# Patient Record
Sex: Male | Born: 1985 | Race: Black or African American | Hispanic: No | Marital: Single | Smoking: Light tobacco smoker
Health system: Southern US, Community
[De-identification: ages and names within clinical notes are randomized; demographics above are authoritative.]

## PROBLEM LIST (undated history)

## (undated) DIAGNOSIS — D571 Sickle-cell disease without crisis: Secondary | ICD-10-CM

## (undated) DIAGNOSIS — M1990 Unspecified osteoarthritis, unspecified site: Secondary | ICD-10-CM

## (undated) DIAGNOSIS — S7290XA Unspecified fracture of unspecified femur, initial encounter for closed fracture: Secondary | ICD-10-CM

## (undated) DIAGNOSIS — S069X9A Unspecified intracranial injury with loss of consciousness of unspecified duration, initial encounter: Secondary | ICD-10-CM

## (undated) DIAGNOSIS — S069XAA Unspecified intracranial injury with loss of consciousness status unknown, initial encounter: Secondary | ICD-10-CM

## (undated) HISTORY — DX: Unspecified fracture of unspecified femur, initial encounter for closed fracture: S72.90XA

## (undated) HISTORY — DX: Sickle-cell disease without crisis: D57.1

## (undated) HISTORY — DX: Unspecified intracranial injury with loss of consciousness of unspecified duration, initial encounter: S06.9X9A

## (undated) HISTORY — DX: Unspecified intracranial injury with loss of consciousness status unknown, initial encounter: S06.9XAA

---
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
## 2007-03-14 ENCOUNTER — Emergency Department (HOSPITAL_COMMUNITY): Admission: EM | Admit: 2007-03-14 | Discharge: 2007-03-14 | Payer: Self-pay | Admitting: Emergency Medicine

## 2008-12-14 ENCOUNTER — Ambulatory Visit: Payer: Self-pay | Admitting: Infectious Disease

## 2008-12-14 ENCOUNTER — Observation Stay (HOSPITAL_COMMUNITY): Admission: EM | Admit: 2008-12-14 | Discharge: 2008-12-19 | Payer: Self-pay | Admitting: Emergency Medicine

## 2008-12-28 ENCOUNTER — Emergency Department (HOSPITAL_COMMUNITY): Admission: EM | Admit: 2008-12-28 | Discharge: 2008-12-29 | Payer: Self-pay | Admitting: Emergency Medicine

## 2009-02-17 ENCOUNTER — Emergency Department (HOSPITAL_COMMUNITY): Admission: EM | Admit: 2009-02-17 | Discharge: 2009-02-18 | Payer: Self-pay | Admitting: Emergency Medicine

## 2010-04-25 ENCOUNTER — Emergency Department (HOSPITAL_COMMUNITY): Admission: EM | Admit: 2010-04-25 | Discharge: 2010-04-25 | Payer: Self-pay | Admitting: Emergency Medicine

## 2011-01-29 LAB — DIFFERENTIAL
Basophils Relative: 0 % (ref 0–1)
Eosinophils Absolute: 0.1 10*3/uL (ref 0.0–0.7)
Monocytes Relative: 5 % (ref 3–12)
Neutro Abs: 8 10*3/uL — ABNORMAL HIGH (ref 1.7–7.7)
Neutrophils Relative %: 79 % — ABNORMAL HIGH (ref 43–77)

## 2011-01-29 LAB — COMPREHENSIVE METABOLIC PANEL
ALT: 49 U/L (ref 0–53)
Alkaline Phosphatase: 73 U/L (ref 39–117)
BUN: 9 mg/dL (ref 6–23)
CO2: 27 mEq/L (ref 19–32)
Calcium: 9.5 mg/dL (ref 8.4–10.5)
GFR calc non Af Amer: >60 mL/min (ref >60)
Glucose, Bld: 103 mg/dL — ABNORMAL HIGH (ref 70–99)
Potassium: 3.6 mEq/L (ref 3.5–5.1)
Sodium: 138 mEq/L (ref 135–145)
Total Protein: 7.7 g/dL (ref 6.0–8.3)

## 2011-01-29 LAB — RAPID URINE DRUG SCREEN, HOSP PERFORMED
Amphetamines: NOT DETECTED
Opiates: NOT DETECTED
Tetrahydrocannabinol: POSITIVE — AB

## 2011-01-29 LAB — CBC
HCT: 45.5 % (ref 39.0–52.0)
Hemoglobin: 15.1 g/dL (ref 13.0–17.0)
MCHC: 33.3 g/dL (ref 30.0–36.0)
RBC: 5.48 MIL/uL (ref 4.22–5.81)
RDW: 14.2 % (ref 11.5–15.5)

## 2011-01-29 LAB — URINALYSIS, ROUTINE W REFLEX MICROSCOPIC
Bilirubin Urine: NEGATIVE
Hgb urine dipstick: NEGATIVE
Ketones, ur: NEGATIVE mg/dL
Nitrite: NEGATIVE
Protein, ur: NEGATIVE mg/dL
Urobilinogen, UA: 0.2 mg/dL (ref 0.0–1.0)

## 2011-01-29 LAB — POCT CARDIAC MARKERS
CKMB, poc: <1 ng/mL — ABNORMAL LOW (ref 1.0–8.0)
Myoglobin, poc: 56.7 ng/mL (ref 12–200)
Troponin i, poc: <0.05 ng/mL (ref 0.00–0.09)

## 2011-01-29 LAB — RETICULOCYTES
RBC.: 5.31 MIL/uL (ref 4.22–5.81)
Retic Count, Absolute: 58.4 10*3/uL (ref 19.0–186.0)
Retic Ct Pct: 1.1 % (ref 0.4–3.1)

## 2011-02-27 LAB — CK
Total CK: 11066 U/L — ABNORMAL HIGH (ref 7–232)
Total CK: 17975 U/L — ABNORMAL HIGH (ref 7–232)
Total CK: 6496 U/L — ABNORMAL HIGH (ref 7–232)

## 2011-02-27 LAB — RAPID URINE DRUG SCREEN, HOSP PERFORMED
Amphetamines: NOT DETECTED
Opiates: POSITIVE — AB
Tetrahydrocannabinol: POSITIVE — AB

## 2011-02-27 LAB — CBC
HCT: 38.4 % — ABNORMAL LOW (ref 39.0–52.0)
HCT: 38.5 % — ABNORMAL LOW (ref 39.0–52.0)
HCT: 42.6 % (ref 39.0–52.0)
Hemoglobin: 13 g/dL (ref 13.0–17.0)
Hemoglobin: 13.1 g/dL (ref 13.0–17.0)
Hemoglobin: 14.3 g/dL (ref 13.0–17.0)
Hemoglobin: 14.3 g/dL (ref 13.0–17.0)
MCHC: 33.1 g/dL (ref 30.0–36.0)
MCHC: 34 g/dL (ref 30.0–36.0)
MCV: 81.4 fL (ref 78.0–100.0)
MCV: 81.9 fL (ref 78.0–100.0)
MCV: 82.8 fL (ref 78.0–100.0)
MCV: 80.9 fL (ref 78.0–100.0)
Platelets: 130 10*3/uL — ABNORMAL LOW (ref 150–400)
Platelets: 138 10*3/uL — ABNORMAL LOW (ref 150–400)
Platelets: 169 10*3/uL (ref 150–400)
Platelets: 189 10*3/uL (ref 150–400)
Platelets: 330 10*3/uL (ref 150–400)
RBC: 4.71 MIL/uL (ref 4.22–5.81)
RBC: 4.85 MIL/uL (ref 4.22–5.81)
RDW: 14.1 % (ref 11.5–15.5)
RDW: 14.6 % (ref 11.5–15.5)
WBC: 7 10*3/uL (ref 4.0–10.5)
WBC: 7.2 10*3/uL (ref 4.0–10.5)
WBC: 7.9 10*3/uL (ref 4.0–10.5)
WBC: 9.4 10*3/uL (ref 4.0–10.5)
WBC: 9.4 10*3/uL (ref 4.0–10.5)
WBC: 9.1 10*3/uL (ref 4.0–10.5)

## 2011-02-27 LAB — COMPREHENSIVE METABOLIC PANEL
ALT: 107 U/L — ABNORMAL HIGH (ref 0–53)
ALT: 22 U/L (ref 0–53)
ALT: 24 U/L (ref 0–53)
ALT: 54 U/L — ABNORMAL HIGH (ref 0–53)
AST: 215 U/L — ABNORMAL HIGH (ref 0–37)
AST: 43 U/L — ABNORMAL HIGH (ref 0–37)
AST: 43 U/L — ABNORMAL HIGH (ref 0–37)
Albumin: 3.2 g/dL — ABNORMAL LOW (ref 3.5–5.2)
Alkaline Phosphatase: 74 U/L (ref 39–117)
Alkaline Phosphatase: 75 U/L (ref 39–117)
Alkaline Phosphatase: 72 U/L (ref 39–117)
BUN: 4 mg/dL — ABNORMAL LOW (ref 6–23)
BUN: 5 mg/dL — ABNORMAL LOW (ref 6–23)
CO2: 24 mEq/L (ref 19–32)
CO2: 25 mEq/L (ref 19–32)
CO2: 26 mEq/L (ref 19–32)
Calcium: 8.5 mg/dL (ref 8.4–10.5)
Calcium: 9.1 mg/dL (ref 8.4–10.5)
Chloride: 104 mEq/L (ref 96–112)
Chloride: 104 mEq/L (ref 96–112)
GFR calc Af Amer: >60 mL/min (ref >60)
GFR calc Af Amer: >60 mL/min (ref >60)
GFR calc Af Amer: >60 mL/min (ref >60)
GFR calc non Af Amer: >60 mL/min (ref >60)
GFR calc non Af Amer: >60 mL/min (ref >60)
Glucose, Bld: 88 mg/dL (ref 70–99)
Potassium: 3.5 mEq/L (ref 3.5–5.1)
Potassium: 3.5 mEq/L (ref 3.5–5.1)
Potassium: 3.6 mEq/L (ref 3.5–5.1)
Potassium: 4 mEq/L (ref 3.5–5.1)
Sodium: 137 mEq/L (ref 135–145)
Sodium: 141 mEq/L (ref 135–145)
Sodium: 141 mEq/L (ref 135–145)
Sodium: 137 mEq/L (ref 135–145)
Total Bilirubin: 0.7 mg/dL (ref 0.3–1.2)
Total Bilirubin: 0.6 mg/dL (ref 0.3–1.2)
Total Protein: 6.8 g/dL (ref 6.0–8.3)
Total Protein: 7.2 g/dL (ref 6.0–8.3)

## 2011-02-27 LAB — TSH: TSH: 1.234 u[IU]/mL (ref 0.350–4.500)

## 2011-02-27 LAB — URINALYSIS, ROUTINE W REFLEX MICROSCOPIC
Bilirubin Urine: NEGATIVE
Glucose, UA: NEGATIVE mg/dL
Hgb urine dipstick: NEGATIVE
Hgb urine dipstick: NEGATIVE
Ketones, ur: NEGATIVE mg/dL
Ketones, ur: NEGATIVE mg/dL
Leukocytes, UA: NEGATIVE
Nitrite: NEGATIVE
Protein, ur: NEGATIVE mg/dL
Protein, ur: NEGATIVE mg/dL
Protein, ur: NEGATIVE mg/dL
Specific Gravity, Urine: 1.006 (ref 1.005–1.030)
Specific Gravity, Urine: 1.013 (ref 1.005–1.030)
Urobilinogen, UA: 0.2 mg/dL (ref 0.0–1.0)
Urobilinogen, UA: 1 mg/dL (ref 0.0–1.0)
Urobilinogen, UA: 1 mg/dL (ref 0.0–1.0)
pH: 7.5 (ref 5.0–8.0)

## 2011-02-27 LAB — DIFFERENTIAL
Basophils Absolute: 0.1 10*3/uL (ref 0.0–0.1)
Basophils Absolute: 0 10*3/uL (ref 0.0–0.1)
Basophils Relative: 0 % (ref 0–1)
Eosinophils Absolute: 0.1 10*3/uL (ref 0.0–0.7)
Eosinophils Absolute: 0.2 10*3/uL (ref 0.0–0.7)
Eosinophils Relative: 1 % (ref 0–5)
Lymphocytes Relative: 20 % (ref 12–46)
Lymphs Abs: 1.4 10*3/uL (ref 0.7–4.0)
Monocytes Absolute: 0.4 10*3/uL (ref 0.1–1.0)
Monocytes Relative: 5 % (ref 3–12)
Monocytes Relative: 4 % (ref 3–12)
Neutro Abs: 4.9 10*3/uL (ref 1.7–7.7)
Neutro Abs: 6.9 10*3/uL (ref 1.7–7.7)
Neutrophils Relative %: 76 % (ref 43–77)

## 2011-02-27 LAB — HIV ANTIBODY (ROUTINE TESTING W REFLEX): HIV: NONREACTIVE

## 2011-02-27 LAB — URINE MICROSCOPIC-ADD ON

## 2011-02-27 LAB — BASIC METABOLIC PANEL
BUN: 4 mg/dL — ABNORMAL LOW (ref 6–23)
BUN: 5 mg/dL — ABNORMAL LOW (ref 6–23)
CO2: 27 mEq/L (ref 19–32)
Chloride: 103 mEq/L (ref 96–112)
Chloride: 99 mEq/L (ref 96–112)
Creatinine, Ser: 0.88 mg/dL (ref 0.4–1.5)
Creatinine, Ser: 0.99 mg/dL (ref 0.4–1.5)
GFR calc Af Amer: >60 mL/min (ref >60)
GFR calc non Af Amer: >60 mL/min (ref >60)
Glucose, Bld: 101 mg/dL — ABNORMAL HIGH (ref 70–99)
Potassium: 3.6 mEq/L (ref 3.5–5.1)
Sodium: 139 mEq/L (ref 135–145)

## 2011-02-27 LAB — URIC ACID: Uric Acid, Serum: 4.9 mg/dL (ref 4.0–7.8)

## 2011-02-27 LAB — SEDIMENTATION RATE: Sed Rate: 3 mm/hr (ref 0–16)

## 2011-02-27 LAB — CK TOTAL AND CKMB (NOT AT ARMC): Total CK: 18481 U/L — ABNORMAL HIGH (ref 7–232)

## 2011-02-27 LAB — PHOSPHORUS: Phosphorus: 3.6 mg/dL (ref 2.3–4.6)

## 2011-02-27 LAB — MAGNESIUM: Magnesium: 2.4 mg/dL (ref 1.5–2.5)

## 2011-03-17 ENCOUNTER — Emergency Department (HOSPITAL_BASED_OUTPATIENT_CLINIC_OR_DEPARTMENT_OTHER)
Admission: EM | Admit: 2011-03-17 | Discharge: 2011-03-18 | Disposition: A | Discharge status: Home / Self Care | Payer: Self-pay | Attending: Emergency Medicine | Admitting: Emergency Medicine

## 2011-03-17 DIAGNOSIS — M549 Dorsalgia, unspecified: Secondary | ICD-10-CM | POA: Insufficient documentation

## 2011-03-17 DIAGNOSIS — D5740 Sickle-cell thalassemia without crisis: Secondary | ICD-10-CM | POA: Insufficient documentation

## 2011-03-27 NOTE — Discharge Summary (Signed)
James Hamilton, James Hamilton           ACCOUNT NO.:  [id number]   MEDICAL RECORD NO.:  [id number]          PATIENT TYPE:  OBV   LOCATION:  3037                         FACILITY:  [facility]   PHYSICIAN:  Acey Lav, MD  DATE OF BIRTH:  05-Apr-1986   DATE OF ADMISSION:  12/14/2008  DATE OF DISCHARGE:  12/19/2008                               DISCHARGE SUMMARY   DISCHARGE DIAGNOSES:  1. Sickle cell pain crisis.  2. Sickle cell/beta thalassemia.  3. Elevated creatinine kinase secondary to sickle cell pain crisis,      resolving.  4. The patient has three different medical record numbers on file in      [facility].   DISCHARGE MEDICATIONS:  Oxycodone 10 mg p.o. q.6 h. p.r.n. for pain.   DISPOSITION AND FOLLOW UP:  The patient was discharged home in stable  condition with much improved pain and was instructed to follow up with  his primary care physician within 1 week of discharge.  The patient was  also instructed to call the [facility] at [phone] to  obtain a hospital followup appointment if he could not be seen by his  PCP, Dr. Orson Hamilton at [facility].  He was also instructed to keep  himself well hydrated at least for the 7 days following discharge.  During his followup appointment, the patient should be asked about the  resolution of his pain crisis.  A creatinine kinase level and a complete  metabolic panel should also be obtained to check for resolution of his  rhabdomyolysis and for normalization of his liver function test.   PROCEDURES PERFORMED:  None.   CONSULTATIONS:  None.   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  The patient presents to the emergency department on day of  admission with complaint of low back pain which he states is exactly the  same as his prior pain  crisis.  States that the pain started  approximately 15 minutes after he lifted weights at his gym for a total  of 2 hours.  The patient denies performing any back exercises or putting  any strenuous strain on his back while lifting.  He has not been to the  gym in over a year and states that his prior pain crisis have usually  occurred after an attempt to exercise.  Denies any headache, chest pain,  shortness of breath, cough, abdominal pain, fevers, chills, nausea,  vomiting, diarrhea, weakness, numbness or tingling, dysuria, or  hematuria.  Denies any associated alleviating or aggravating factors and  has tried using some ibuprofen to help his pain without any relief.  The  patient also denies any trauma to the low back area and states that the  morphine that he received when he arrived to the emergency room was  helpful in relieving his pain.   PHYSICAL EXAMINATION:  VITAL SIGNS:  On admission, temperature 97.1,  blood pressure 123/56,  pulse of 90, respiratory rate of 18, oxygen  saturation 100% on room air.  GENERAL:  He was in no acute distress.  EYES:  Extraocular motions intact.  Pupils equal, round, and reactive to  light.  Anicteric sclerae.  ENT:  Moist mucous membranes and oropharynx was clear without erythema.  NECK:  Supple without JVD or thyromegaly.  RESPIRATORY:  Clear to auscultation bilaterally.  CARDIOVASCULAR:  Regular rate and rhythm.  No murmurs, rubs or gallops.  GI:  Positive bowel sounds with a soft, nontender and nondistended  abdomen.  The patient had no hepatosplenomegaly.  EXTREMITY:  No clubbing, cyanosis or edema.  GENITOURINARY:  No costovertebral angle tenderness.  MUSCULOSKELETAL:  Mild tenderness to palpation in the mid lumbar spine  and paraspinal area.  NEURO:  The patient to be alert and oriented x3.  Cranial nerves II-XII  were intact, 5/5 strength and normal sensation throughout and 2+ deep  tendon reflexes throughout.   LABORATORY DATA:  The  patient had a white blood cell count of 9.4,  hemoglobin of 14.3, platelets of 189, MCV of 83.  Sodium 141, potassium  5, chloride 27, bicarb 25, BUN 7, creatinine 1.8, glucose 95, total  bilirubin 0.9, alk phos 94, AST 23, ALT 22.  The patient's initial  urinalysis was completely within normal limits.   PROBLEM:  1. Sickle cell pain crisis.  On admission, the patient was started on      IV morphine as needed and oral Vicodin for his pain.  The patient      also received a film of his lumbar spine which was unremarkable.      Records from his primary care James Hamilton's office were tried to be      obtained but were not able to do so.  The patient stated that his      pain started to radiate into his thighs on hospital day #2 which      had happened for with his previous pain crisis.  An initial urine      drug screen obtained on admission was negative except for      marijuana.  The patient required an increased dose of morphine      during hospital day #2 and 3 however, afterwards his pain became      less and less until he was completely off IV morphine and switched      over to oral pain medications.  By the day of discharge, the      patient's pain was much better controlled although he still had      some discomfort in his upper and his low back.  He was discharged      home with oxycodone and instructed to follow up with his primary      care James Hamilton within 1 week.  2. Increased creatinine kinase.  On admission, the patient was found      to have an elevated total CK level of approximately 2000.      Initially, there was a concern for rhabdomyolysis as his CK has      elevated to a maximum of over 20,000 on hospital day #2 and his LDH      was also found to be elevated at 817.  This was thought most likely      to have been secondary to sickle cell pain crisis and the patient      was given aggressive IV hydration to prevent any renal function  degradation from his elevated  creatinine kinase.  An ESR was      checked and found to be within normal limits, however, the C-      reactive protein was slightly elevated during this admission.  The      patient's urinalysis did not indicate any signs of myoglobinuria,      however.  The patient maintained good urine output throughout the      rest of his hospitalization and the patient's urine was alkalinized      to a pH of about 6.5-7.0 as well with addition of bicarbonate to      his normal saline IV fluids.  A TSH and HIV antibody were also      checked.  However, these were both negative for any abnormalities.      Throughout the rest of his hospital course, the patient had      decreased creatinine kinase levels on subsequent hospital days and      by day of discharge was found to be approximately 6000.  The      patient was also noticed to have slight elevation in his AST and      ALT most likely due to his muscle breakdown.  Therefore, he was      changed from oral Vicodin for his pain control to oxycodone to      decrease the Tylenol burden.  The patient will need to have his      liver function test re-evaluated as well as to repeat creatinine      kinase levels checked on his followup appointment to ensure      resolution of his rhabdo.   DISCHARGE LABORATORY DATA AND VITALS:  On day of discharge, the patient  had a temperature of 98.3, blood pressure of 112/63, pulse of 69,  respiratory rate of 18, and oxygen saturation of 99% on room air.  His  white blood cell count was 7.8, hemoglobin 12.7, MCV of 82.8, platelets  of 169.  Sodium 132, potassium 3.2, chloride 99,  bicarb 27, BUN 4, creatinine 0.88, glucose 101, calcium 8.5, magnesium  2.0, phosphorus 3.7, creatinine kinase was 6400.  TSH was 1.23.  HIV was  nonreactive.  Serum uric acid was 4.9, ESR was 3.0, CRP was 6.4,  peripheral smear was notable for target cells and urinalysis was  completely within normal limits by the day of  discharge.      Lucy Antigua, MD  Electronically Signed      Acey Lav, MD  Electronically Signed    RK/MEDQ  D:  12/20/2008  T:  12/21/2008  Job:  [id number]   cc:   Dr. Orson Hamilton

## 2011-06-15 ENCOUNTER — Encounter: Payer: Self-pay | Admitting: *Deleted

## 2011-06-15 ENCOUNTER — Other Ambulatory Visit: Payer: Self-pay

## 2011-06-15 ENCOUNTER — Emergency Department (INDEPENDENT_AMBULATORY_CARE_PROVIDER_SITE_OTHER): Payer: Self-pay

## 2011-06-15 ENCOUNTER — Emergency Department (HOSPITAL_BASED_OUTPATIENT_CLINIC_OR_DEPARTMENT_OTHER)
Admission: EM | Admit: 2011-06-15 | Discharge: 2011-06-15 | Disposition: A | Discharge status: Home / Self Care | Payer: Self-pay | Attending: Emergency Medicine | Admitting: Emergency Medicine

## 2011-06-15 DIAGNOSIS — F17200 Nicotine dependence, unspecified, uncomplicated: Secondary | ICD-10-CM | POA: Insufficient documentation

## 2011-06-15 DIAGNOSIS — D571 Sickle-cell disease without crisis: Secondary | ICD-10-CM | POA: Insufficient documentation

## 2011-06-15 DIAGNOSIS — R12 Heartburn: Secondary | ICD-10-CM

## 2011-06-15 DIAGNOSIS — R079 Chest pain, unspecified: Secondary | ICD-10-CM | POA: Insufficient documentation

## 2011-06-15 HISTORY — DX: Sickle-cell disease without crisis: D57.1

## 2011-06-15 MED ORDER — OXYCODONE-ACETAMINOPHEN 5-325 MG PO TABS
1.0000 | ORAL_TABLET | Freq: Once | ORAL | Status: AC
  Administered 2011-06-15: 1 via ORAL

## 2011-06-15 MED ORDER — GI COCKTAIL ~~LOC~~
30.0000 mL | Freq: Once | ORAL | Status: AC
  Administered 2011-06-15: 30 mL via ORAL

## 2011-06-15 MED ORDER — FAMOTIDINE 20 MG PO TABS
20.0000 mg | ORAL_TABLET | Freq: Once | ORAL | Status: AC
  Administered 2011-06-15: 20 mg via ORAL

## 2011-06-15 MED ORDER — FAMOTIDINE 20 MG PO TABS: 20.0000 mg | ORAL_TABLET | Freq: Two times a day (BID) | ORAL | Status: DC

## 2011-06-15 NOTE — ED Provider Notes (Signed)
History     CSN: [id number] Arrival date & time: 06/15/2011  7:03 PM  Chief Complaint  Patient presents with  . Chest Pain   Patient is a 25 y.o. male presenting with chest pain. The history is provided by the patient and a parent.  Chest Pain The chest pain began yesterday. Duration of episode(s) is 1 day. Chest pain occurs constantly. The chest pain is unchanged. Associated with: nothing. The severity of the pain is moderate. The quality of the pain is described as pressure-like. The pain does not radiate. Chest pain is worsened by eating. Pertinent negatives for primary symptoms include no fever, no syncope, no shortness of breath, no cough, no wheezing, no palpitations, no abdominal pain, no nausea, no vomiting and no dizziness.  Pertinent negatives for associated symptoms include no lower extremity edema. He tried NSAIDs for the symptoms.   has acid taste in his mouth, took a few doses of motrin yesterday for a HA that is resolved.   Past Medical History  Diagnosis Date  . Sickle cell anemia     History reviewed. No pertinent past surgical history.  No family history on file.  History  Substance Use Topics  . Smoking status: Current Everyday Smoker -- 0.5 packs/day  . Smokeless tobacco: Not on file  . Alcohol Use: Yes      Review of Systems  Constitutional: Negative for fever and chills.  HENT: Negative for neck pain and neck stiffness.   Eyes: Negative for pain.  Respiratory: Negative for cough, shortness of breath and wheezing.   Cardiovascular: Positive for chest pain. Negative for palpitations and syncope.  Gastrointestinal: Negative for nausea, vomiting and abdominal pain.  Genitourinary: Negative for dysuria.  Musculoskeletal: Negative for back pain.  Skin: Negative for rash.  Neurological: Negative for dizziness and headaches.  All other systems reviewed and are negative.    Physical Exam  BP 133/86  Pulse 57  Temp(Src) 98.7 F (37.1 C) (Oral)  Resp 18   SpO2 99%  Physical Exam  Constitutional: He is oriented to person, place, and time. He appears well-developed and well-nourished.  HENT:  Head: Normocephalic and atraumatic.  Eyes: Conjunctivae and EOM are normal. Pupils are equal, round, and reactive to light.  Neck: Full passive range of motion without pain. Neck supple. No thyromegaly present.       No meningismus  Cardiovascular: Normal rate, regular rhythm, S1 normal, S2 normal and intact distal pulses.   Pulmonary/Chest: Effort normal and breath sounds normal.  Abdominal: Soft. Bowel sounds are normal. There is no tenderness. There is no CVA tenderness.  Musculoskeletal: Normal range of motion.  Neurological: He is alert and oriented to person, place, and time. He has normal strength and normal reflexes. No cranial nerve deficit or sensory deficit. He displays a negative Romberg sign. GCS eye subscore is 4. GCS verbal subscore is 5. GCS motor subscore is 6.       Normal Gait  Skin: Skin is warm and dry. No rash noted. No cyanosis. Nails show no clubbing.  Psychiatric: He has a normal mood and affect. His speech is normal and behavior is normal.    ED Course  Procedures   Date: 06/15/2011  Rate: 63  Rhythm: normal sinus rhythm  QRS Axis: normal  Intervals: normal  ST/T Wave abnormalities: nonspecific ST/T changes  Conduction Disutrbances:nonspecific intraventricular conduction delay  Narrative Interpretation:   Old EKG Reviewed: unchanged    MDM Symptoms suggest reflux, GI cocktail PO resolved symptoms in  ED.  CXR reviewed NAD. No F/C or symptoms of acute chest given h/o SCA.  Screening ECG reviewed and for abnormal T wave inversions similar to old ECg, results were shared with PT.  Presentation not c/w ACS.       Sunnie Nielsen, MD 06/15/11 [phone]

## 2011-06-15 NOTE — ED Notes (Signed)
States he feels like he has reflux x 2 days. Aching in his left chest temp relief with rolaids.

## 2012-02-29 ENCOUNTER — Encounter (HOSPITAL_COMMUNITY): Payer: Self-pay | Admitting: Adult Health

## 2012-02-29 ENCOUNTER — Emergency Department (HOSPITAL_COMMUNITY)
Admission: EM | Admit: 2012-02-29 | Discharge: 2012-02-29 | Discharge status: Against Medical Advice | Payer: Self-pay | Attending: Emergency Medicine | Admitting: Emergency Medicine

## 2012-02-29 ENCOUNTER — Non-Acute Institutional Stay (HOSPITAL_COMMUNITY)
Admission: AD | Admit: 2012-02-29 | Discharge: 2012-02-29 | Disposition: A | Discharge status: Home / Self Care | Payer: Medicaid Other | Source: Ambulatory Visit | Attending: Internal Medicine | Admitting: Internal Medicine

## 2012-02-29 ENCOUNTER — Encounter (HOSPITAL_BASED_OUTPATIENT_CLINIC_OR_DEPARTMENT_OTHER): Payer: Self-pay

## 2012-02-29 ENCOUNTER — Emergency Department (HOSPITAL_BASED_OUTPATIENT_CLINIC_OR_DEPARTMENT_OTHER)
Admission: EM | Admit: 2012-02-29 | Discharge: 2012-02-29 | Disposition: A | Discharge status: Home / Self Care | Payer: Medicaid Other | Attending: Emergency Medicine | Admitting: Emergency Medicine

## 2012-02-29 DIAGNOSIS — D5700 Hb-SS disease with crisis, unspecified: Secondary | ICD-10-CM | POA: Insufficient documentation

## 2012-02-29 DIAGNOSIS — D57419 Sickle-cell thalassemia with crisis, unspecified: Secondary | ICD-10-CM | POA: Insufficient documentation

## 2012-02-29 LAB — CBC
MCV: 73.4 fL — ABNORMAL LOW (ref 78.0–100.0)
Platelets: 236 10*3/uL (ref 150–400)
RBC: 5.23 MIL/uL (ref 4.22–5.81)
RDW: 13.8 % (ref 11.5–15.5)
WBC: 12.1 10*3/uL — ABNORMAL HIGH (ref 4.0–10.5)

## 2012-02-29 LAB — COMPREHENSIVE METABOLIC PANEL
ALT: 17 U/L (ref 0–53)
AST: 21 U/L (ref 0–37)
Alkaline Phosphatase: 69 U/L (ref 39–117)
BUN: 9 mg/dL (ref 6–23)
BUN: 10 mg/dL (ref 6–23)
CO2: 23 mEq/L (ref 19–32)
CO2: 22 mEq/L (ref 19–32)
Calcium: 9.4 mg/dL (ref 8.4–10.5)
Chloride: 105 mEq/L (ref 96–112)
Creatinine, Ser: 1 mg/dL (ref 0.50–1.35)
GFR calc Af Amer: >90 mL/min (ref >90)
GFR calc Af Amer: >90 mL/min (ref >90)
GFR calc non Af Amer: >90 mL/min (ref >90)
GFR calc non Af Amer: >90 mL/min (ref >90)
Glucose, Bld: 77 mg/dL (ref 70–99)
Glucose, Bld: 80 mg/dL (ref 70–99)
Potassium: 4.9 mEq/L (ref 3.5–5.1)
Sodium: 139 mEq/L (ref 135–145)
Total Bilirubin: 0.8 mg/dL (ref 0.3–1.2)

## 2012-02-29 LAB — DIFFERENTIAL
Blasts: 0 %
Eosinophils Absolute: 0.1 10*3/uL (ref 0.0–0.7)
Eosinophils Relative: 1 % (ref 0–5)
Metamyelocytes Relative: 0 %
Monocytes Absolute: 0.6 10*3/uL (ref 0.1–1.0)
Monocytes Relative: 5 % (ref 3–12)
Neutro Abs: 9.3 10*3/uL — ABNORMAL HIGH (ref 1.7–7.7)
Neutrophils Relative %: 77 % (ref 43–77)
nRBC: 0 /100 WBC

## 2012-02-29 LAB — URINALYSIS, ROUTINE W REFLEX MICROSCOPIC
Bilirubin Urine: NEGATIVE
Ketones, ur: NEGATIVE mg/dL
Leukocytes, UA: NEGATIVE
Nitrite: NEGATIVE
Urobilinogen, UA: 1 mg/dL (ref 0.0–1.0)

## 2012-02-29 MED ORDER — DEXTROSE-NACL 5-0.45 % IV SOLN
INTRAVENOUS | Status: DC
  Administered 2012-02-29: 16:00:00 via INTRAVENOUS

## 2012-02-29 MED ORDER — ONDANSETRON HCL 4 MG/2ML IJ SOLN: 4.0000 mg | INTRAMUSCULAR | Status: DC | PRN

## 2012-02-29 MED ORDER — HYDROMORPHONE HCL PF 1 MG/ML IJ SOLN
1.0000 mg | INTRAMUSCULAR | Status: AC
  Administered 2012-02-29: 1 mg via INTRAVENOUS
  Filled 2012-02-29: qty 1

## 2012-02-29 MED ORDER — ONDANSETRON HCL 4 MG PO TABS: 4.0000 mg | ORAL_TABLET | ORAL | Status: DC | PRN

## 2012-02-29 MED ORDER — ONDANSETRON HCL 4 MG/2ML IJ SOLN
4.0000 mg | Freq: Once | INTRAMUSCULAR | Status: AC
  Administered 2012-02-29: 4 mg via INTRAVENOUS
  Filled 2012-02-29: qty 2

## 2012-02-29 MED ORDER — SODIUM CHLORIDE 0.9 % IV SOLN: Freq: Once | INTRAVENOUS | Status: DC

## 2012-02-29 MED ORDER — HYDROMORPHONE HCL PF 2 MG/ML IJ SOLN
2.0000 mg | Freq: Once | INTRAMUSCULAR | Status: AC
  Administered 2012-02-29: 2 mg via INTRAVENOUS
  Filled 2012-02-29: qty 1

## 2012-02-29 MED ORDER — DIPHENHYDRAMINE HCL 50 MG/ML IJ SOLN: 12.5000 mg | INTRAMUSCULAR | Status: DC | PRN

## 2012-02-29 MED ORDER — FOLIC ACID 1 MG PO TABS: 1.0000 mg | ORAL_TABLET | Freq: Every day | ORAL | Status: AC

## 2012-02-29 MED ORDER — OXYCODONE-ACETAMINOPHEN 10-650 MG PO TABS: 1.0000 | ORAL_TABLET | Freq: Four times a day (QID) | ORAL | Status: AC | PRN

## 2012-02-29 MED ORDER — FOLIC ACID 1 MG PO TABS: 1.0000 mg | ORAL_TABLET | Freq: Every day | ORAL | Status: DC

## 2012-02-29 MED ORDER — SODIUM CHLORIDE 0.9 % IV BOLUS (SEPSIS)
1000.0000 mL | Freq: Once | INTRAVENOUS | Status: AC
  Administered 2012-02-29: 1000 mL via INTRAVENOUS

## 2012-02-29 MED ORDER — DIPHENHYDRAMINE HCL 25 MG PO CAPS: 25.0000 mg | ORAL_CAPSULE | ORAL | Status: DC | PRN

## 2012-02-29 MED ORDER — HYDROMORPHONE HCL PF 2 MG/ML IJ SOLN
2.0000 mg | INTRAMUSCULAR | Status: DC | PRN
  Administered 2012-02-29: 2 mg via INTRAVENOUS
  Administered 2012-02-29: 4 mg via INTRAVENOUS
  Filled 2012-02-29: qty 1
  Filled 2012-02-29: qty 2

## 2012-02-29 NOTE — Discharge Instructions (Signed)
Sickle Cell Pain Crisis Sickle cell anemia requires regular medical attention by your healthcare provider and awareness about when to seek medical care. Pain is a common problem in children with sickle cell disease. This usually starts at less than 25 year of age. Pain can occur nearly anywhere in the body but most commonly occurs in the extremities, back, chest, or belly (abdomen). Pain episodes can start suddenly or may follow an illness. These attacks can appear as decreased activity, loss of appetite, change in behavior, or simply complaints of pain. DIAGNOSIS   Specialized blood and gene testing can help make this diagnosis early in the disease. Blood tests may then be done to watch blood levels.   Specialized brain scans are done when there are problems in the brain during a crisis.   Lung testing may be done later in the disease.  HOME CARE INSTRUCTIONS   Maintain good hydration. Increase you or your child's fluid intake in hot weather and during exercise.   Avoid smoking. Smoking lowers the oxygen in the blood and can cause the production of sickle-shaped cells (sickling).   Control pain. Only take over-the-counter or prescription medicines for pain, discomfort, or fever as directed by your caregiver. Do not give aspirin to children because of the association with Reye's syndrome.   Keep regular health care checks to keep a proper red blood cell (hemoglobin) level. A moderate anemia level protects against sickling crises.   You and your child should receive all the same immunizations and care as the people around you.   Mothers should breastfeed their babies if possible. Use formulas with iron added if breastfeeding is not possible. Additional iron should not be given unless there is a lack of it. People with sickle cell disease (SCD) build up iron faster than normal. Give folic acid and additional vitamins as directed.   If you or your child has been prescribed antibiotics or other  medications to prevent problems, take them as directed.   Summer camps are available for children with SCD. They may help young people deal with their disease. The camps introduce them to other children with the same problem.   Young people with SCD may become frustrated or angry at their disease. This can cause rebellion and refusal to follow medical care. Help groups or counseling may help with these problems.   Wear a medical alert bracelet. When traveling, keep medical information, caregiver's names, and the medications you or your child takes with you at all times.  SEEK IMMEDIATE MEDICAL CARE IF:   You or your child develops dizziness or fainting, numbness in or difficulty with movement of arms and legs, difficulty with speech, or is acting abnormally. This could be early signs of a stroke. Immediate treatment is necessary.   You or your child has an oral temperature above 102 F (38.9 C), not controlled by medicine.   You or your child has other signs of infection (chills, lethargy, irritability, poor eating, vomiting). The younger the child, the more you should be concerned.   With fevers, do not give medicine to lower the fever right away. This could cover up a problem that is developing. Notify your caregiver.   You or your child develops pain that is not helped with medicine.   You or your child develops shortness of breath or is coughing up pus-like or bloody sputum.   You or your child develops any problems that are new and are causing you to worry.   You or   your child develops a persistent, often uncomfortable and painful penile erection. This is called priapism. Always check young boys for this. It is often embarrassing for them and they may not bring it to your attention. This is a medical emergency and needs immediate treatment. If this is not treated it will lead to impotence.   You or your child develops a new onset of abdominal pain, especially on the left side near the  stomach area.   You or your child has any questions or has problems that are not getting better. Return immediately if you feel your child is getting worse, even if your child was seen only a short while ago.  Document Released: 08/08/2005 Document Revised: 10/18/2011 Document Reviewed: 12/28/2009 [employer] Patient Information 2012 [employer]. 

## 2012-02-29 NOTE — Discharge Summary (Signed)
[facility]  Patient ID: James Hamilton MRN: [id number] DOB/AGE: 1986/08/03 25 y.o.  Admit date: 02/29/2012 Discharge date: 02/29/2012  Admission Diagnoses: Sickle cell crisis  Discharge Diagnoses:  Sickle cell crisis Hemoglobin S./beta thalassemia   Discharged Condition: good  Clinic Course: Patient was admitted to the sickle cell the hospital for further treatment of his sickle cell crisis. He was continued on IV fluids of half-normal saline at 125 cc an hour. He was given IV Dilaudid at 2-4 mg IV every 2 hours when necessary. Over the subsequent hours his pain decreased from a 10 to a 6/10. He still has some lower back pain. Minimal leg pain as well. He feels however that he can manage his pain at home with his regular medications of Percocet.  Consults: None  Significant Diagnostic Studies: Laboratory data was reviewed.  Treatments: As noted above. Discharge Exam: Blood pressure 121/63, pulse 71, temperature 97.5 F (36.4 C), temperature source Oral, resp. rate 20, SpO2 98.00%. Head: Normocephalic, without obvious abnormality, atraumatic NECK: No enlarged thyroid. No posterior cervical nodes. LUNGS: Clear to auscultation. No vocal fremitus. CV: Normal S1, S2 without S3. ABDOMEN: No masses or tenderness. MSK: Tenderness in the lower lumbar sacral spine. Negative Homans. NEURO: Intact.  Disposition: Discharged home.  Discharge Orders    Future Orders Please Complete By Expires   Diet - low sodium heart healthy      Increase activity slowly      No wound care      Call MD for:  severe uncontrolled pain      Discharge instructions      Comments:   Follow up with primary care physician in one week.     Medication List  As of 02/29/2012  5:51 PM   TAKE these medications         famotidine 20 MG tablet   Commonly known as: PEPCID   Take 1 tablet (20 mg total) by mouth 2 (two) times daily.      folic acid 1 MG tablet   Commonly known  as: FOLVITE   Take 1 tablet (1 mg total) by mouth daily.      ibuprofen 200 MG tablet   Commonly known as: ADVIL,MOTRIN   Take 400 mg by mouth 3 (three) times daily as needed. For pain      oxyCODONE-acetaminophen 10-650 MG per tablet   Commonly known as: PERCOCET   Take 1 tablet by mouth every 6 (six) hours as needed for pain.             SignedAugust Saucer, Dianelly Ferran 02/29/2012, 5:51 PM

## 2012-02-29 NOTE — Discharge Instructions (Signed)
Go directly to the [facility] common sickle cell unit which is located at [address] [facility]) on the third floor. Their telephone number is [phone].  Sickle Cell Anemia Sickle cell anemia needs regular medical care by your caregiver and awareness by you when to seek medical care. Pain is a common problem in children with sickle cell disease. This usually starts at less than 25 year of age. Pain can occur nearly anywhere in the body, but most commonly happens in the extremities, back, chest, or belly (abdomen). Pain episodes can start suddenly or may follow an illness. These attacks can appear as decreased activity, loss of appetite, change in behavior, or simply complaints of pain. DIAGNOSIS   Specialized blood and gene testing can help make this diagnosis early in the disease. Blood tests may then be done to watch blood levels.   Specialized brain scans are done when there are problems in the brain during a crisis.   Lung testing may be done later in the disease.  HOME CARE INSTRUCTIONS   Maintain good hydration. Increase your child's fluid intake in hot weather and during exercise.   Avoid smoking around your child. Smoking lowers the oxygen in the blood and can cause sickling.   Control pain. Only give your child over-the-counter or prescription medicines for pain, discomfort, or fever as directed by their caregiver. Do not give aspirin to children because of the association with Reye's syndrome.   Keep regular health care checks to keep a proper red blood cell (hemoglobin) level. A moderate anemia level protects against sickling crises.   You or your child should receive all the same immunizations and care as the people around them.   Moms should breastfeed their babies if possible. Use formulas with iron added if breastfeeding is not possible. Additional iron should not be given unless there is a lack of it. People with SCD build up iron faster than normal. Give folic acid  and additional vitamins as directed.   If you or your child has been prescribed antibiotics or other medications to prevent problems, give them as directed.   Summer camps are available for children with SCD. They may help young people deal with their disease. The camps introduce them to other children with the same condition.   Young people with SCD may become frustrated or angry at their disease. This can cause rebellion and refusal to follow medical care. Help groups or counseling may help with these problems.   Make sure your child wears a medical alert bracelet. When traveling, keep your medical information, caregiver's names, and the medications your child takes with you at all times.  SEEK IMMEDIATE MEDICAL CARE IF:   You or your child feels dizzy or faint.   You or your child develops a new onset of abdominal pain, especially on the left side near the stomach area.   You or your child develops a persistent, often uncomfortable and painful penile erection. This is called priapism. Always check young boys for this. It is often embarrassing for them and they may not bring it to your attention. This is a medical emergency and needs immediate treatment. If this is not treated it will lead to impotence.   You or your child develops numbness in or has a hard time moving arms and legs.   You or your child has a hard time with speech.   You have a fever.   You or your child develops signs of infection (chills,  lethargy, irritability, poor eating, vomiting). The younger the child, the more you should be concerned.   With fevers, do not give medications to lower the fever right away. This could cover up a problem that is developing. Notify your caregiver.   You or your child develops pain that is not helped with medicine.   You or your child develops shortness of breath, or is coughing up pus-like or bloody sputum.   You or your child develops any problems that are new and are causing you  to worry.  Document Released: 02/06/2006 Document Revised: 10/18/2011 Document Reviewed: 03/29/2010 [facility] Patient Information 2012 [place], [place].

## 2012-02-29 NOTE — ED Notes (Signed)
C/o sickle cell pain in lower back and legs from [facility] sent over with IV,pain  began 2-3 hours ago

## 2012-02-29 NOTE — ED Notes (Signed)
Pt states that he is having sickle cell crisis pain.  Pt appears very uncomfortable in triage, denies other symptoms besides pain in legs and lower back.

## 2012-02-29 NOTE — ED Notes (Signed)
Pt given two hot packs.  

## 2012-02-29 NOTE — ED Notes (Signed)
Jonah Blue, RN and EMT student at [facility] to start IV and give meds-Pt girlfriend at [facility] angry that pt has not had pain med-I advised her pt has been in the ED less than 30 min and  of need to wait for EDP orders prior to meds given-she left the room-spoke to pt after she left room-states he has no c/o r/t to his care

## 2012-02-29 NOTE — ED Notes (Signed)
Pt aware and agreeable to go to [facility]-states girlfriend is en route to drive him

## 2012-02-29 NOTE — ED Notes (Signed)
Report given to Korah at [facility] clinic-pt to be d/c from ED via private vehicle-lab reports light green top needs to be redrawn due to blood hemolysis

## 2012-02-29 NOTE — ED Notes (Signed)
Korah at [facility] notified pt has been d/c and en route with girlfriend

## 2012-02-29 NOTE — ED Provider Notes (Signed)
History     CSN: [id number]  Arrival date & time 02/29/12  1227   First MD Initiated Contact with Patient 02/29/12 1240      Chief Complaint  Patient presents with  . Sickle Cell Pain Crisis    (Consider location/radiation/quality/duration/timing/severity/associated sxs/prior treatment) Patient is a 26 y.o. male presenting with sickle cell pain. The history is provided by the patient.  Sickle Cell Pain Crisis   He had been doing some lifting and had sudden onset of severe pain across his low back into his legs which is typical of his sickle cell pain. He rates pain at 10/10. He has not taken anything to help it. Nothing makes the pain better nothing makes it worse. He denies fever, chills, sweats. He denies chest pain or dyspnea or nausea or vomiting. He states he had not eaten or drank anything today. He has not taken anything for pain. When his pain started today, he came directly to the emergency department.  Past Medical History  Diagnosis Date  . Sickle cell anemia     History reviewed. No pertinent past surgical history.  History reviewed. No pertinent family history.  History  Substance Use Topics  . Smoking status: Current Everyday Smoker -- 0.5 packs/day  . Smokeless tobacco: Not on file  . Alcohol Use: Yes      Review of Systems  All other systems reviewed and are negative.    Allergies  Review of patient's allergies indicates no known allergies.  Home Medications   Current Outpatient Rx  Name Route Sig Dispense Refill  . FAMOTIDINE 20 MG PO TABS Oral Take 1 tablet (20 mg total) by mouth 2 (two) times daily. 30 tablet 0  . IBUPROFEN 200 MG PO TABS Oral Take 400 mg by mouth 3 (three) times daily as needed. For pain       BP 104/82  Pulse 96  Temp(Src) 98.7 F (37.1 C) (Oral)  Resp 20  Ht 6\' 1"  (1.854 m)  Wt 155 lb (70.308 kg)  BMI 20.45 kg/m2  SpO2 97%  Physical Exam  Nursing note and vitals reviewed.  26 year old male who appears to be in  pain. Vital signs are normal. Oxygen saturation is 97% which is normal. Head is normocephalic and atraumatic. PERRLA, EOMI. The face is clear. Neck is nontender and supple without adenopathy. Lungs are clear without rales, wheezes, rhonchi. Back is nontender there's no CVA tenderness. Heart has regular rate and rhythm without murmur. Abdomen is soft, flat, nontender without masses or hepatosplenomegaly. Extremities have full range of motion, no cyanosis or edema. Skin is warm and dry without rash. Neurologic: Mental status is normal, cranial nerves are intact, there are no focal motor or sensory deficits.  ED Course  Procedures (including critical care time)  Results for orders placed during the hospital encounter of 02/29/12  CBC      Component Value Range   WBC 12.1 (*) 4.0 - 10.5 (K/uL)   RBC 5.23  4.22 - 5.81 (MIL/uL)   Hemoglobin 14.2  13.0 - 17.0 (g/dL)   HCT 57.8 (*) 46.9 - 52.0 (%)   MCV 73.4 (*) 78.0 - 100.0 (fL)   MCH 27.2  26.0 - 34.0 (pg)   MCHC 37.0 (*) 30.0 - 36.0 (g/dL)   RDW 62.9  52.8 - 41.3 (%)   Platelets 236  150 - 400 (K/uL)  DIFFERENTIAL      Component Value Range   Neutrophils Relative 77  43 - 77 (%)   Lymphocytes  Relative 17  12 - 46 (%)   Monocytes Relative 5  3 - 12 (%)   Eosinophils Relative 1  0 - 5 (%)   Basophils Relative 0  0 - 1 (%)   Band Neutrophils 0  0 - 10 (%)   Metamyelocytes Relative 0     Myelocytes 0     Promyelocytes Absolute 0     Blasts 0     nRBC 0  0 (/100 WBC)   Neutro Abs 9.3 (*) 1.7 - 7.7 (K/uL)   Lymphs Abs 2.1  0.7 - 4.0 (K/uL)   Monocytes Absolute 0.6  0.1 - 1.0 (K/uL)   Eosinophils Absolute 0.1  0.0 - 0.7 (K/uL)   Basophils Absolute 0.0  0.0 - 0.1 (K/uL)   RBC Morphology POLYCHROMASIA PRESENT    COMPREHENSIVE METABOLIC PANEL      Component Value Range   Sodium 139  135 - 145 (mEq/L)   Potassium 4.9  3.5 - 5.1 (mEq/L)   Chloride 103  96 - 112 (mEq/L)   CO2 23  19 - 32 (mEq/L)   Glucose, Bld 77  70 - 99 (mg/dL)   BUN 9  6  - 23 (mg/dL)   Creatinine, Ser 1.61  0.50 - 1.35 (mg/dL)   Calcium 9.4  8.4 - 09.6 (mg/dL)   Total Protein 7.9  6.0 - 8.3 (g/dL)   Albumin 4.6  3.5 - 5.2 (g/dL)   AST 44 (*) 0 - 37 (U/L)   ALT 17  0 - 53 (U/L)   Alkaline Phosphatase 69  39 - 117 (U/L)   Total Bilirubin 0.8  0.3 - 1.2 (mg/dL)   GFR calc non Af Amer >90  >90 (mL/min)   GFR calc Af Amer >90  >90 (mL/min)  URINALYSIS, ROUTINE W REFLEX MICROSCOPIC      Component Value Range   Color, Urine YELLOW  YELLOW    APPearance CLEAR  CLEAR    Specific Gravity, Urine 1.014  1.005 - 1.030    pH 6.0  5.0 - 8.0    Glucose, UA NEGATIVE  NEGATIVE (mg/dL)   Hgb urine dipstick NEGATIVE  NEGATIVE    Bilirubin Urine NEGATIVE  NEGATIVE    Ketones, ur NEGATIVE  NEGATIVE (mg/dL)   Protein, ur NEGATIVE  NEGATIVE (mg/dL)   Urobilinogen, UA 1.0  0.0 - 1.0 (mg/dL)   Nitrite NEGATIVE  NEGATIVE    Leukocytes, UA NEGATIVE  NEGATIVE   RETICULOCYTES      Component Value Range   Retic Ct Pct 1.6  0.4 - 3.1 (%)   RBC. 5.22  4.22 - 5.81 (MIL/uL)   Retic Count, Manual 83.5  19.0 - 186.0 (K/uL)  COMPREHENSIVE METABOLIC PANEL      Component Value Range   Sodium 139  135 - 145 (mEq/L)   Potassium 3.6  3.5 - 5.1 (mEq/L)   Chloride 105  96 - 112 (mEq/L)   CO2 22  19 - 32 (mEq/L)   Glucose, Bld 80  70 - 99 (mg/dL)   BUN 10  6 - 23 (mg/dL)   Creatinine, Ser 0.45  0.50 - 1.35 (mg/dL)   Calcium 8.8  8.4 - 40.9 (mg/dL)   Total Protein 7.3  6.0 - 8.3 (g/dL)   Albumin 4.4  3.5 - 5.2 (g/dL)   AST 21  0 - 37 (U/L)   ALT 12  0 - 53 (U/L)   Alkaline Phosphatase 71  39 - 117 (U/L)   Total Bilirubin 0.8  0.3 -  1.2 (mg/dL)   GFR calc non Af Amer >90  >90 (mL/min)   GFR calc Af Amer >90  >90 (mL/min)    IV was started and IV fluids are initiated. He was feeling considerably better after an initial dose of hydromorphone and Zofran. Case has been discussed with Dr. Willey Blade who agrees to accept him in the sickle cell unit. Patient will be discharged here to go  directly to the sickle cell unit.  1. Sickle cell crisis       MDM  Sickle cell crisis which may been triggered by a relative dehydration. Of note, he also does smoke black and mild cigars periodically which also puts him at risk for sickle crises. Old records are reviewed and he is noted to have had several ED visits for sickle cell crises. However, he actually has relatively few ED visits. IV fluids and narcotics have been ordered. Will also discuss his case with Dr. August Saucer to see if he would be a good candidate for the sickle cell unit at [facility].        Dione Booze, MD 03/01/12 [phone]

## 2012-03-01 ENCOUNTER — Encounter (HOSPITAL_COMMUNITY): Payer: Self-pay | Admitting: Family Medicine

## 2012-03-01 ENCOUNTER — Emergency Department (HOSPITAL_COMMUNITY)
Admission: EM | Admit: 2012-03-01 | Discharge: 2012-03-02 | Disposition: A | Discharge status: Home / Self Care | Payer: Medicaid Other | Attending: Emergency Medicine | Admitting: Emergency Medicine

## 2012-03-01 DIAGNOSIS — M545 Low back pain, unspecified: Secondary | ICD-10-CM | POA: Insufficient documentation

## 2012-03-01 DIAGNOSIS — D5700 Hb-SS disease with crisis, unspecified: Secondary | ICD-10-CM | POA: Insufficient documentation

## 2012-03-01 DIAGNOSIS — F17200 Nicotine dependence, unspecified, uncomplicated: Secondary | ICD-10-CM | POA: Insufficient documentation

## 2012-03-01 DIAGNOSIS — Z79899 Other long term (current) drug therapy: Secondary | ICD-10-CM | POA: Insufficient documentation

## 2012-03-01 LAB — CBC
MCH: 27.5 pg (ref 26.0–34.0)
MCV: 76.2 fL — ABNORMAL LOW (ref 78.0–100.0)
Platelets: 125 10*3/uL — ABNORMAL LOW (ref 150–400)
RBC: 4.99 MIL/uL (ref 4.22–5.81)
RDW: 14.2 % (ref 11.5–15.5)
WBC: 12.5 10*3/uL — ABNORMAL HIGH (ref 4.0–10.5)

## 2012-03-01 LAB — RETICULOCYTES: Retic Ct Pct: 1.2 % (ref 0.4–3.1)

## 2012-03-01 MED ORDER — DIPHENHYDRAMINE HCL 50 MG/ML IJ SOLN: 25.0000 mg | INTRAMUSCULAR | Status: DC | PRN

## 2012-03-01 MED ORDER — HYDROMORPHONE HCL PF 2 MG/ML IJ SOLN
2.0000 mg | Freq: Once | INTRAMUSCULAR | Status: AC
  Administered 2012-03-01: 2 mg via INTRAVENOUS
  Filled 2012-03-01: qty 1

## 2012-03-01 MED ORDER — SODIUM CHLORIDE 0.9 % IV SOLN
INTRAVENOUS | Status: DC
  Administered 2012-03-01: 20:00:00 via INTRAVENOUS

## 2012-03-01 MED ORDER — HYDROMORPHONE HCL PF 2 MG/ML IJ SOLN
2.0000 mg | INTRAMUSCULAR | Status: AC | PRN
  Administered 2012-03-01 (×2): 2 mg via INTRAVENOUS
  Filled 2012-03-01 (×2): qty 1

## 2012-03-01 MED ORDER — ONDANSETRON HCL 4 MG/2ML IJ SOLN
4.0000 mg | INTRAMUSCULAR | Status: DC | PRN
  Administered 2012-03-01: 4 mg via INTRAVENOUS
  Filled 2012-03-01: qty 2

## 2012-03-01 NOTE — ED Notes (Signed)
Pt c/o back and bilat leg pain, pt states this feels similar to previous crisis that occur about 2x a year. Pt states he was seen at [facility] and sickle cell clinic yesterday and was given Percocet. Pt states he took 1 every 6 hours with no relief. IV est and pt medicated per MD order

## 2012-03-01 NOTE — ED Notes (Signed)
pts mother expressed concern over "swollen area" to back, MD made awarey

## 2012-03-01 NOTE — Discharge Instructions (Signed)
RESOURCE GUIDE  Dental Problems  Patients with Medicaid: [facility]                     [facility] [address]                                           [address] Phone:  [phone]                                                  Phone:  [phone]  If unable to pay or uninsured, contact:  [facility] or [facility] to become qualified for the adult dental clinic.  Chronic Pain Problems Contact [facility]  [phone] Patients need to be referred by their primary care doctor.  Insufficient Money for Medicine Contact [employer]:  call "211" or [facility] 271-5999.  No Primary Care Doctor Call [facility]  [phone] Other agencies that provide inexpensive medical care    [facility]  [phone]    [facility]  [phone]    [facility]  [phone]    Women's Clinic  [phone]    [facility]  [phone]    [facility]  [phone]  Psychological Services [facility]  [phone] [facility]  [phone] [facility]   [phone] (emergency services [phone])  Substance Abuse Resources [facility]  336-882-2125 [facility] 336-784-9470 [facility] 336-285-9073 [facility] 336-845-3988 Residential & Outpatient Substance Abuse Program  800-659-3381  Abuse/Neglect [place] Child Abuse Hotline (336) 641-3795 [place] Child Abuse Hotline 800-378-5315 (After Hours)  Emergency Shelter [facility] (336) 271-5985  M[facility] Homes [facility] (336) 275-9566 [facility] (704) 372-4663  MRSA Hotline #:   832-7006    [place] [facility]  [facility] [place]     [employer]                          [facility] [address] [place]                       [address]      [address]  [place]                                                [place]                            [place] Phone:  [phone]                                   Phone:  [phone]                 Phone:  [phone]  [facility] Phone:  [phone]    [facility] Child Abuse Hotline 478 721 5177 765-663-0307 (After Hours)   Take your usual prescriptions as previously directed.  Apply moist heat or ice to the area(s) of discomfort, for 15 minutes at a time, several times per day for the next few days.  Do not fall asleep on a heating or ice pack.  Call your regular medical doctor or Dr August Saucer on Monday to schedule a follow up appointment this week.  Return to the Emergency Department immediately if worsening.

## 2012-03-01 NOTE — ED Notes (Signed)
Pt reports having sickle cell pain crisis pain in lower back and legs. Reports pain feels like normal crisis.  Denies N/V. Reports generalized weakness.

## 2012-03-01 NOTE — ED Provider Notes (Signed)
History     CSN: [id number]  Arrival date & time 03/01/12  1840   First MD Initiated Contact with Patient 03/01/12 1914      Chief Complaint  Patient presents with  . Sickle Cell Pain Crisis    HPI Pt was seen at 1925.  Per pt, c/o gradual onset and persistence of constant acute flair of his chronic sickle cell "pain" since yesterday.  Pt describes the pain as per is usual chronic pain pattern, located in his lower back and legs.  Pt has been eval in the ED and Sickle Cell clinic for same yesterday but endorses that his "pain meds aren't working at home."  Denies CP/SOB, no fevers, no cough, no abd pain, no N/V/D, no rash.     Past Medical History  Diagnosis Date  . Sickle cell anemia     History reviewed. No pertinent past surgical history.   History  Substance Use Topics  . Smoking status: Current Everyday Smoker -- 0.5 packs/day  . Smokeless tobacco: Not on file  . Alcohol Use: Yes    Review of Systems ROS: Statement: All systems negative except as marked or noted in the HPI; Constitutional: Negative for fever and chills. ; ; Eyes: Negative for eye pain, redness and discharge. ; ; ENMT: Negative for ear pain, hoarseness, nasal congestion, sinus pressure and sore throat. ; ; Cardiovascular: Negative for chest pain, palpitations, diaphoresis, dyspnea and peripheral edema. ; ; Respiratory: Negative for cough, wheezing and stridor. ; ; Gastrointestinal: Negative for nausea, vomiting, diarrhea, abdominal pain, blood in stool, hematemesis, jaundice and rectal bleeding. . ; ; Genitourinary: Negative for dysuria, flank pain and hematuria. ; ; Musculoskeletal: +low back and legs "pain."  Negative for neck pain. Negative for swelling and trauma.; ; Skin: Negative for pruritus, rash, abrasions, blisters, bruising and skin lesion.; ; Neuro: Negative for headache, lightheadedness and neck stiffness. Negative for weakness, altered level of consciousness , altered mental status, extremity  weakness, paresthesias, involuntary movement, seizure and syncope.      Allergies  Review of patient's allergies indicates no known allergies.  Home Medications   Current Outpatient Rx  Name Route Sig Dispense Refill  . ASPIRIN-ACETAMINOPHEN-CAFFEINE 250-250-65 MG PO TABS Oral Take 1 tablet by mouth every 6 (six) hours as needed.    Marland Kitchen FOLIC ACID 1 MG PO TABS Oral Take 1 tablet (1 mg total) by mouth daily. 100 tablet 0  . OXYCODONE-ACETAMINOPHEN 10-650 MG PO TABS Oral Take 1 tablet by mouth every 6 (six) hours as needed for pain. 60 tablet 0    BP 129/70  Pulse 79  Temp(Src) 99.1 F (37.3 C) (Oral)  Resp 20  Ht 6\' 1"  (1.854 m)  Wt 155 lb (70.308 kg)  BMI 20.45 kg/m2  SpO2 98%  Physical Exam 1930: Physical examination:  Nursing notes reviewed; Vital signs and O2 SAT reviewed;  Constitutional: Well developed, Well nourished, Well hydrated, Uncomfortable appearing; Head:  Normocephalic, atraumatic; Eyes: EOMI, PERRL, No scleral icterus; ENMT: Mouth and pharynx normal, Mucous membranes moist; Neck: Supple, Full range of motion, No lymphadenopathy; Cardiovascular: Regular rate and rhythm, No murmur, rub, or gallop; Respiratory: Breath sounds clear & equal bilaterally, No rales, rhonchi, wheezes, or rub, Normal respiratory effort/excursion; Chest: Nontender, Movement normal; Abdomen: Soft, Nontender, Nondistended, Normal bowel sounds; Spine:  No midline CS, TS, LS tenderness. +TTP bilat lumbar paraspinal muscles.;  Extremities: Pulses normal, No tenderness, No edema, No calf edema or asymmetry.; Neuro: AA&Ox3, Major CN grossly intact.  No gross  focal motor or sensory deficits in extremities.; Skin: Color normal, Warm, Dry, no rash.    ED Course  Procedures    MDM  MDM Reviewed: nursing note, vitals and previous chart Reviewed previous: labs Interpretation: labs   Results for orders placed during the hospital encounter of 03/01/12  CBC      Component Value Range   WBC 12.5 (*)  4.0 - 10.5 (K/uL)   RBC 4.99  4.22 - 5.81 (MIL/uL)   Hemoglobin 13.7  13.0 - 17.0 (g/dL)   HCT 82.9 (*) 56.2 - 52.0 (%)   MCV 76.2 (*) 78.0 - 100.0 (fL)   MCH 27.5  26.0 - 34.0 (pg)   MCHC 36.1 (*) 30.0 - 36.0 (g/dL)   RDW 13.0  86.5 - 78.4 (%)   Platelets 125 (*) 150 - 400 (K/uL)  RETICULOCYTES      Component Value Range   Retic Ct Pct 1.2  0.4 - 3.1 (%)   RBC. 4.99  4.22 - 5.81 (MIL/uL)   Retic Count, Manual 59.9  19.0 - 186.0 (K/uL)     11:26 PM:  Pt states he is "ready to go now if you can just give me another shot first."  H/H per baseline.  VS remain stable, resps easy.  Appears comfortable.  Dx testing d/w pt and family.  Questions answered.  Verb understanding, agreeable to d/c home with outpt f/u.             Laray Anger, DO 03/03/12 1219

## 2012-03-02 ENCOUNTER — Telehealth (HOSPITAL_COMMUNITY): Payer: Self-pay | Admitting: Hematology

## 2012-03-02 ENCOUNTER — Encounter (HOSPITAL_COMMUNITY): Payer: Self-pay | Admitting: Hematology

## 2012-03-02 NOTE — Progress Notes (Unsigned)
Pt James Hamilton [facility] at 3[facility], complaining of bilateral leg and lower back pain with a score of 6-7/10 (acceptable score=3/10). Patient denied shortness of breath and chest pain. He allegedly visited the ED last night around 6 am, given IV fluids and controlled pain, then was discharged from ED after acceptable pain control. He was prescribed Percocet which he took 1 tablet PO last at 1:30 pm; obtaining only partial relief. Notified Dr. Sharyn Lull, on-call by his paging service, then he called back after few minutes. He asked where the patient was located, then advised [place] to send patient to the ED for evaluation by the ED doctor, initiation IV fluids and control of pain. He said that ED can call him back if he needs to be admitted. He was advised to report to [facility], but according to nurse Jennings Books that he did not want to go back to [facility] because he experienced "bad care there last time and did not wish to be seen there today". Instead, he was encouraged several times to go to [facility] for hydration and pain control. We offered to take the patient to the ED, but he declined.

## 2012-04-19 ENCOUNTER — Emergency Department (HOSPITAL_COMMUNITY): Payer: PRIVATE HEALTH INSURANCE

## 2012-04-19 ENCOUNTER — Emergency Department (HOSPITAL_COMMUNITY)
Admission: EM | Admit: 2012-04-19 | Discharge: 2012-04-19 | Disposition: A | Discharge status: Home / Self Care | Payer: PRIVATE HEALTH INSURANCE | Attending: Cardiovascular Disease | Admitting: Cardiovascular Disease

## 2012-04-19 ENCOUNTER — Encounter (HOSPITAL_COMMUNITY): Payer: Self-pay

## 2012-04-19 DIAGNOSIS — R079 Chest pain, unspecified: Secondary | ICD-10-CM | POA: Insufficient documentation

## 2012-04-19 DIAGNOSIS — D5700 Hb-SS disease with crisis, unspecified: Secondary | ICD-10-CM

## 2012-04-19 DIAGNOSIS — R0682 Tachypnea, not elsewhere classified: Secondary | ICD-10-CM | POA: Insufficient documentation

## 2012-04-19 DIAGNOSIS — F17200 Nicotine dependence, unspecified, uncomplicated: Secondary | ICD-10-CM | POA: Insufficient documentation

## 2012-04-19 LAB — CBC
HCT: 38.4 % — ABNORMAL LOW (ref 39.0–52.0)
MCHC: 34.9 g/dL (ref 30.0–36.0)
MCV: 76 fL — ABNORMAL LOW (ref 78.0–100.0)
Platelets: 244 10*3/uL (ref 150–400)
RDW: 15.6 % — ABNORMAL HIGH (ref 11.5–15.5)

## 2012-04-19 LAB — DIFFERENTIAL
Basophils Absolute: 0 10*3/uL (ref 0.0–0.1)
Basophils Relative: 0 % (ref 0–1)
Eosinophils Absolute: 0.2 10*3/uL (ref 0.0–0.7)
Eosinophils Relative: 2 % (ref 0–5)
Monocytes Absolute: 0.4 10*3/uL (ref 0.1–1.0)

## 2012-04-19 LAB — COMPREHENSIVE METABOLIC PANEL
AST: 19 U/L (ref 0–37)
Albumin: 3.8 g/dL (ref 3.5–5.2)
Calcium: 9.1 mg/dL (ref 8.4–10.5)
Creatinine, Ser: 1.02 mg/dL (ref 0.50–1.35)
GFR calc non Af Amer: >90 mL/min (ref >90)
Sodium: 140 mEq/L (ref 135–145)
Total Protein: 7.4 g/dL (ref 6.0–8.3)

## 2012-04-19 MED ORDER — ONDANSETRON HCL 4 MG/2ML IJ SOLN
4.0000 mg | Freq: Once | INTRAMUSCULAR | Status: AC
  Administered 2012-04-19: 4 mg via INTRAVENOUS
  Filled 2012-04-19: qty 2

## 2012-04-19 MED ORDER — PANTOPRAZOLE SODIUM 40 MG PO TBEC
40.0000 mg | DELAYED_RELEASE_TABLET | Freq: Once | ORAL | Status: AC
  Administered 2012-04-19: 40 mg via ORAL
  Filled 2012-04-19: qty 1

## 2012-04-19 MED ORDER — HYDROMORPHONE HCL PF 1 MG/ML IJ SOLN: 1.0000 mg | Freq: Once | INTRAMUSCULAR | Status: DC

## 2012-04-19 MED ORDER — HYDROMORPHONE HCL PF 2 MG/ML IJ SOLN
2.0000 mg | Freq: Once | INTRAMUSCULAR | Status: AC
Start: 2012-04-19 — End: 2012-04-19
  Administered 2012-04-19: 2 mg via INTRAVENOUS
  Filled 2012-04-19: qty 1

## 2012-04-19 MED ORDER — GI COCKTAIL ~~LOC~~
30.0000 mL | Freq: Once | ORAL | Status: AC
  Administered 2012-04-19: 30 mL via ORAL
  Filled 2012-04-19: qty 30

## 2012-04-19 MED ORDER — OXYCODONE-ACETAMINOPHEN 5-325 MG PO TABS: 1.0000 | ORAL_TABLET | ORAL | Status: DC | PRN

## 2012-04-19 MED ORDER — SODIUM CHLORIDE 0.9 % IV BOLUS (SEPSIS)
1000.0000 mL | Freq: Once | INTRAVENOUS | Status: AC
  Administered 2012-04-19: 1000 mL via INTRAVENOUS

## 2012-04-19 MED ORDER — HYDROMORPHONE HCL PF 2 MG/ML IJ SOLN
2.0000 mg | Freq: Once | INTRAMUSCULAR | Status: AC
  Administered 2012-04-19: 2 mg via INTRAVENOUS
  Filled 2012-04-19: qty 1

## 2012-04-19 MED ORDER — SODIUM CHLORIDE 0.9 % IV SOLN
Freq: Once | INTRAVENOUS | Status: AC
  Administered 2012-04-19: 10:00:00 via INTRAVENOUS

## 2012-04-19 NOTE — Discharge Instructions (Signed)
Sickle Cell Pain Crisis Sickle cell anemia requires regular medical attention by your healthcare provider and awareness about when to seek medical care. Pain is a common problem in children with sickle cell disease. This usually starts at less than 25 year of age. Pain can occur nearly anywhere in the body but most commonly occurs in the extremities, back, chest, or belly (abdomen). Pain episodes can start suddenly or may follow an illness. These attacks can appear as decreased activity, loss of appetite, change in behavior, or simply complaints of pain. DIAGNOSIS   Specialized blood and gene testing can help make this diagnosis early in the disease. Blood tests may then be done to watch blood levels.   Specialized brain scans are done when there are problems in the brain during a crisis.   Lung testing may be done later in the disease.  HOME CARE INSTRUCTIONS   Maintain good hydration. Increase you or your child's fluid intake in hot weather and during exercise.   Avoid smoking. Smoking lowers the oxygen in the blood and can cause the production of sickle-shaped cells (sickling).   Control pain. Only take over-the-counter or prescription medicines for pain, discomfort, or fever as directed by your caregiver. Do not give aspirin to children because of the association with Reye's syndrome.   Keep regular health care checks to keep a proper red blood cell (hemoglobin) level. A moderate anemia level protects against sickling crises.   You and your child should receive all the same immunizations and care as the people around you.   Mothers should breastfeed their babies if possible. Use formulas with iron added if breastfeeding is not possible. Additional iron should not be given unless there is a lack of it. People with sickle cell disease (SCD) build up iron faster than normal. Give folic acid and additional vitamins as directed.   If you or your child has been prescribed antibiotics or other  medications to prevent problems, take them as directed.   Summer camps are available for children with SCD. They may help young people deal with their disease. The camps introduce them to other children with the same problem.   Young people with SCD may become frustrated or angry at their disease. This can cause rebellion and refusal to follow medical care. Help groups or counseling may help with these problems.   Wear a medical alert bracelet. When traveling, keep medical information, caregiver's names, and the medications you or your child takes with you at all times.  SEEK IMMEDIATE MEDICAL CARE IF:   You or your child develops dizziness or fainting, numbness in or difficulty with movement of arms and legs, difficulty with speech, or is acting abnormally. This could be early signs of a stroke. Immediate treatment is necessary.   You or your child has an oral temperature above 102 F (38.9 C), not controlled by medicine.   You or your child has other signs of infection (chills, lethargy, irritability, poor eating, vomiting). The younger the child, the more you should be concerned.   With fevers, do not give medicine to lower the fever right away. This could cover up a problem that is developing. Notify your caregiver.   You or your child develops pain that is not helped with medicine.   You or your child develops shortness of breath or is coughing up pus-like or bloody sputum.   You or your child develops any problems that are new and are causing you to worry.   You or   your child develops a persistent, often uncomfortable and painful penile erection. This is called priapism. Always check young boys for this. It is often embarrassing for them and they may not bring it to your attention. This is a medical emergency and needs immediate treatment. If this is not treated it will lead to impotence.   You or your child develops a new onset of abdominal pain, especially on the left side near the  stomach area.   You or your child has any questions or has problems that are not getting better. Return immediately if you feel your child is getting worse, even if your child was seen only a short while ago.  Document Released: 08/08/2005 Document Revised: 10/18/2011 Document Reviewed: 12/28/2009 [facility] Patient Information 2012 [place], [place].  Acetaminophen; Oxycodone tablets What is this medicine? ACETAMINOPHEN; OXYCODONE (a set a MEE noe fen; ox i KOE done) is a pain reliever. It is used to treat mild to moderate pain. This medicine may be used for other purposes; ask your health care provider or pharmacist if you have questions. What should I tell my health care provider before I take this medicine? They need to know if you have any of these conditions: -brain tumor -Crohn's disease, inflammatory bowel disease, or ulcerative colitis -drink more than 3 alcohol containing drinks per day -drug abuse or addiction -head injury -heart or circulation problems -kidney disease or problems going to the bathroom -liver disease -lung disease, asthma, or breathing problems -an unusual or allergic reaction to acetaminophen, oxycodone, other opioid analgesics, other medicines, foods, dyes, or preservatives -pregnant or trying to get pregnant -breast-feeding How should I use this medicine? Take this medicine by mouth with a full glass of water. Follow the directions on the prescription label. Take your medicine at regular intervals. Do not take your medicine more often than directed. Talk to your pediatrician regarding the use of this medicine in children. Special care may be needed. Patients over 28 years old may have a stronger reaction and need a smaller dose. Overdosage: If you think you have taken too much of this medicine contact a poison control center or emergency room at once. NOTE: This medicine is only for you. Do not share this medicine with others. What if I miss a dose? If you  miss a dose, take it as soon as you can. If it is almost time for your next dose, take only that dose. Do not take double or extra doses. What may interact with this medicine? -alcohol or medicines that contain alcohol -antihistamines -barbiturates like amobarbital, butalbital, butabarbital, methohexital, pentobarbital, phenobarbital, thiopental, and secobarbital -benztropine -drugs for bladder problems like solifenacin, trospium, oxybutynin, tolterodine, hyoscyamine, and methscopolamine -drugs for breathing problems like ipratropium and tiotropium -drugs for certain stomach or intestine problems like propantheline, homatropine methylbromide, glycopyrrolate, atropine, belladonna, and dicyclomine -general anesthetics like etomidate, ketamine, nitrous oxide, propofol, desflurane, enflurane, halothane, isoflurane, and sevoflurane -medicines for depression, anxiety, or psychotic disturbances -medicines for pain like codeine, morphine, pentazocine, buprenorphine, butorphanol, nalbuphine, tramadol, and propoxyphene -medicines for sleep -muscle relaxants -naltrexone -phenothiazines like perphenazine, thioridazine, chlorpromazine, mesoridazine, fluphenazine, prochlorperazine, promazine, and trifluoperazine -scopolamine -trihexyphenidyl This list may not describe all possible interactions. Give your health care provider a list of all the medicines, herbs, non-prescription drugs, or dietary supplements you use. Also tell them if you smoke, drink alcohol, or use illegal drugs. Some items may interact with your medicine. What should I watch for while using this medicine? Tell your doctor or health care professional if  your pain does not go away, if it gets worse, or if you have new or a different type of pain. You may develop tolerance to the medicine. Tolerance means that you will need a higher dose of the medication for pain relief. Tolerance is normal and is expected if you take this medicine for a long  time. Do not suddenly stop taking your medicine because you may develop a severe reaction. Your body becomes used to the medicine. This does NOT mean you are addicted. Addiction is a behavior related to getting and using a drug for a nonmedical reason. If you have pain, you have a medical reason to take pain medicine. Your doctor will tell you how much medicine to take. If your doctor wants you to stop the medicine, the dose will be slowly lowered over time to avoid any side effects. You may get drowsy or dizzy. Do not drive, use machinery, or do anything that needs mental alertness until you know how this medicine affects you. Do not stand or sit up quickly, especially if you are an older patient. This reduces the risk of dizzy or fainting spells. Alcohol may interfere with the effect of this medicine. Avoid alcoholic drinks. The medicine will cause constipation. Try to have a bowel movement at least every 2 to 3 days. If you do not have a bowel movement for 3 days, call your doctor or health care professional. Do not take Tylenol (acetaminophen) or medicines that have acetaminophen with this medicine. Too much acetaminophen can be very dangerous. Many nonprescription medicines contain acetaminophen. Always read the labels carefully to avoid taking more acetaminophen. What side effects may I notice from receiving this medicine? Side effects that you should report to your doctor or health care professional as soon as possible: -allergic reactions like skin rash, itching or hives, swelling of the face, lips, or tongue -breathing difficulties, wheezing -confusion -light headedness or fainting spells -severe stomach pain -yellowing of the skin or the whites of the eyes Side effects that usually do not require medical attention (report to your doctor or health care professional if they continue or are bothersome): -dizziness -drowsiness -nausea -vomiting This list may not describe all possible side  effects. Call your doctor for medical advice about side effects. You may report side effects to FDA at 1-800-FDA-1088. Where should I keep my medicine? Keep out of the reach of children. This medicine can be abused. Keep your medicine in a safe place to protect it from theft. Do not share this medicine with anyone. Selling or giving away this medicine is dangerous and against the law. Store at room temperature between 20 and 25 degrees C (68 and 77 degrees F). Keep container tightly closed. Protect from light. Flush any unused medicines down the toilet. Do not use the medicine after the expiration date. NOTE: This sheet is a summary. It may not cover all possible information. If you have questions about this medicine, talk to your doctor, pharmacist, or health care provider.  2012, [employer]. (09/27/2008 10:01:21 AM)

## 2012-04-19 NOTE — ED Notes (Signed)
Pt is aware of the need of the urine but he is unable to void at the time.

## 2012-04-19 NOTE — ED Notes (Signed)
Pt in from home with c/o sickle cell crises states pain in chest back stomach, states onset this am denies pain intervention at home

## 2012-04-19 NOTE — ED Provider Notes (Addendum)
History     CSN: [id number]  Arrival date & time 04/19/12  4098   First MD Initiated Contact with Patient 04/19/12 [phone]      Chief Complaint  Patient presents with  . Sickle Cell Pain Crisis    (Consider location/radiation/quality/duration/timing/severity/associated sxs/prior treatment) HPI 26 year old male history of sickle cell disease had onset this morning of severe, sharp pain in his chest and back and upper abdomen. Pain is typical of his sickle cell disease and also a with a component is typical of his acid reflux. Nothing makes it better nothing makes it worse. He denies dyspnea. He has had a cough productive of some clear to yellowish sputum. Denies fever, chills, sweats. He denies nausea, vomiting, diarrhea. He had Percocet at home but has run out. Past Medical History  Diagnosis Date  . Sickle cell anemia     History reviewed. No pertinent past surgical history.  No family history on file.  History  Substance Use Topics  . Smoking status: Current Everyday Smoker -- 0.5 packs/day  . Smokeless tobacco: Not on file  . Alcohol Use: Yes      Review of Systems  Allergies  Review of patient's allergies indicates no known allergies.  Home Medications   Current Outpatient Rx  Name Route Sig Dispense Refill  . ASPIRIN-ACETAMINOPHEN-CAFFEINE 250-250-65 MG PO TABS Oral Take 1 tablet by mouth every 6 (six) hours as needed. PAIN    . FOLIC ACID 1 MG PO TABS Oral Take 1 tablet (1 mg total) by mouth daily. 100 tablet 0    BP 114/56  Pulse 98  Temp(Src) 97.7 F (36.5 C) (Oral)  Resp 22  Ht 6\' 1"  (1.854 m)  Wt 160 lb (72.576 kg)  BMI 21.11 kg/m2  SpO2 100%  Physical Exam  26 year old male who was riding in pain. Vital signs are signif the abdomen is soft without hepatosplenomegaly. Extremities have no cyanosis or edema, full range of motion is present. Skin is warm and dry without rash. Neurologic: Mental status is normal, nerves are intact, there are no focal motor  or sensory deficits.icant for mild tachypnea with respiratory rate of 22. Oxygen saturation is 100% which is normal. Head is normocephalic and atraumatic. PERRLA, EOMI. Oropharynx is clear. Neck is nontender and supple. Back is nontender. Lungs are clear without rales, wheezes, rhonchi. Heart has regular rate rhythm without murmur. There is mild anterior chest wall tenderness. Abdomen has moderate epigastric tenderness without hepatosplenomegaly. The abdomen is soft. Extremities have no cyanosis or edema, full range of motion is present. Skin is warm and dry without rash. Neurologic: Mental status is normal, cranial nerves are intact, there are no motor or sensory deficits.  ED Course  Procedures (including critical care time)  Results for orders placed during the hospital encounter of 04/19/12  CBC      Component Value Range   WBC 9.4  4.0 - 10.5 (K/uL)   RBC 5.05  4.22 - 5.81 (MIL/uL)   Hemoglobin 13.4  13.0 - 17.0 (g/dL)   HCT 47.8 (*) 29.5 - 52.0 (%)   MCV 76.0 (*) 78.0 - 100.0 (fL)   MCH 26.5  26.0 - 34.0 (pg)   MCHC 34.9  30.0 - 36.0 (g/dL)   RDW 62.1 (*) 30.8 - 15.5 (%)   Platelets 244  150 - 400 (K/uL)  DIFFERENTIAL      Component Value Range   Neutrophils Relative 70  43 - 77 (%)   Neutro Abs 6.6  1.7 - 7.7 (  K/uL)   Lymphocytes Relative 23  12 - 46 (%)   Lymphs Abs 2.2  0.7 - 4.0 (K/uL)   Monocytes Relative 4  3 - 12 (%)   Monocytes Absolute 0.4  0.1 - 1.0 (K/uL)   Eosinophils Relative 2  0 - 5 (%)   Eosinophils Absolute 0.2  0.0 - 0.7 (K/uL)   Basophils Relative 0  0 - 1 (%)   Basophils Absolute 0.0  0.0 - 0.1 (K/uL)  COMPREHENSIVE METABOLIC PANEL      Component Value Range   Sodium 140  135 - 145 (mEq/L)   Potassium 3.5  3.5 - 5.1 (mEq/L)   Chloride 104  96 - 112 (mEq/L)   CO2 23  19 - 32 (mEq/L)   Glucose, Bld 84  70 - 99 (mg/dL)   BUN 9  6 - 23 (mg/dL)   Creatinine, Ser 4.09  0.50 - 1.35 (mg/dL)   Calcium 9.1  8.4 - 81.1 (mg/dL)   Total Protein 7.4  6.0 - 8.3 (g/dL)    Albumin 3.8  3.5 - 5.2 (g/dL)   AST 19  0 - 37 (U/L)   ALT 11  0 - 53 (U/L)   Alkaline Phosphatase 103  39 - 117 (U/L)   Total Bilirubin 0.4  0.3 - 1.2 (mg/dL)   GFR calc non Af Amer >90  >90 (mL/min)   GFR calc Af Amer >90  >90 (mL/min)  RETICULOCYTES      Component Value Range   Retic Ct Pct 2.8  0.4 - 3.1 (%)   RBC. 5.05  4.22 - 5.81 (MIL/uL)   Retic Count, Manual 141.4  19.0 - 186.0 (K/uL)   Dg Chest 2 View  04/19/2012  *RADIOLOGY REPORT*  Clinical Data: Sickle cell crisis.  Mid chest pain.  CHEST - 2 VIEW  Comparison: Two-view chest 06/15/2011.  Findings: The heart size is normal.  The lungs are clear.  The visualized soft tissues and bony thorax are unremarkable.  IMPRESSION: Negative chest.  Original Report Authenticated By: Jamesetta Orleans. MATTERN, M.D.      Date: 04/19/2012  Rate: 81  Rhythm: normal sinus rhythm  QRS Axis: normal  Intervals: normal  ST/T Wave abnormalities: normal  Conduction Disutrbances:none  Narrative Interpretation: Left ventricular hypertrophy. When compared with ECG of 06/15/2011, anterolateral and inferior T wave inversions have resolved.  Old EKG Reviewed: changes noted    1. Sickle cell crisis       MDM  Sickle cell crisis. He may have a component of GERD as well. Old records are reviewed and he has multiple ED visits and stays in the sickle cell unit for sickle cell crises.  He has received moderate relief from IV fluids, oxygen, and IV hydromorphone. Case is discussed with Dr. Algie Coffer who agrees to admit him to the sickle cell unit.  Dione Booze, MD 04/19/12 [phone]  Pain has improved, he wishes to go home. He will go home with a prescription for Percocet.  Dione Booze, MD 04/19/12 1055

## 2012-04-20 ENCOUNTER — Emergency Department (HOSPITAL_COMMUNITY): Payer: Medicaid Other

## 2012-04-20 ENCOUNTER — Encounter (HOSPITAL_COMMUNITY): Payer: Self-pay | Admitting: Emergency Medicine

## 2012-04-20 ENCOUNTER — Inpatient Hospital Stay (HOSPITAL_COMMUNITY)
Admission: EM | Admit: 2012-04-20 | Discharge: 2012-04-23 | DRG: 812 | Disposition: A | Discharge status: Home / Self Care | Payer: Medicaid Other | Attending: Internal Medicine | Admitting: Internal Medicine

## 2012-04-20 DIAGNOSIS — Z79899 Other long term (current) drug therapy: Secondary | ICD-10-CM

## 2012-04-20 DIAGNOSIS — K219 Gastro-esophageal reflux disease without esophagitis: Secondary | ICD-10-CM | POA: Diagnosis present

## 2012-04-20 DIAGNOSIS — E876 Hypokalemia: Secondary | ICD-10-CM | POA: Diagnosis present

## 2012-04-20 DIAGNOSIS — F17200 Nicotine dependence, unspecified, uncomplicated: Secondary | ICD-10-CM | POA: Diagnosis present

## 2012-04-20 DIAGNOSIS — Z7982 Long term (current) use of aspirin: Secondary | ICD-10-CM

## 2012-04-20 DIAGNOSIS — J40 Bronchitis, not specified as acute or chronic: Secondary | ICD-10-CM | POA: Diagnosis present

## 2012-04-20 DIAGNOSIS — R079 Chest pain, unspecified: Secondary | ICD-10-CM

## 2012-04-20 DIAGNOSIS — R42 Dizziness and giddiness: Secondary | ICD-10-CM | POA: Diagnosis not present

## 2012-04-20 DIAGNOSIS — R748 Abnormal levels of other serum enzymes: Secondary | ICD-10-CM | POA: Diagnosis present

## 2012-04-20 DIAGNOSIS — Z720 Tobacco use: Secondary | ICD-10-CM | POA: Diagnosis present

## 2012-04-20 DIAGNOSIS — R7989 Other specified abnormal findings of blood chemistry: Secondary | ICD-10-CM

## 2012-04-20 DIAGNOSIS — D5700 Hb-SS disease with crisis, unspecified: Principal | ICD-10-CM | POA: Diagnosis present

## 2012-04-20 DIAGNOSIS — R0789 Other chest pain: Secondary | ICD-10-CM | POA: Diagnosis present

## 2012-04-20 DIAGNOSIS — R112 Nausea with vomiting, unspecified: Secondary | ICD-10-CM | POA: Diagnosis present

## 2012-04-20 DIAGNOSIS — E860 Dehydration: Secondary | ICD-10-CM | POA: Diagnosis present

## 2012-04-20 DIAGNOSIS — D5720 Sickle-cell/Hb-C disease without crisis: Secondary | ICD-10-CM

## 2012-04-20 DIAGNOSIS — R1013 Epigastric pain: Secondary | ICD-10-CM | POA: Diagnosis present

## 2012-04-20 LAB — RETICULOCYTES
Retic Count, Absolute: 87.4 10*3/uL (ref 19.0–186.0)
Retic Ct Pct: 1.9 % (ref 0.4–3.1)

## 2012-04-20 LAB — COMPREHENSIVE METABOLIC PANEL
Albumin: 3.6 g/dL (ref 3.5–5.2)
BUN: 4 mg/dL — ABNORMAL LOW (ref 6–23)
Calcium: 8.9 mg/dL (ref 8.4–10.5)
Creatinine, Ser: 0.93 mg/dL (ref 0.50–1.35)
Total Bilirubin: 0.5 mg/dL (ref 0.3–1.2)
Total Protein: 7 g/dL (ref 6.0–8.3)

## 2012-04-20 LAB — CBC
HCT: 35 % — ABNORMAL LOW (ref 39.0–52.0)
MCH: 26.7 pg (ref 26.0–34.0)
MCHC: 35.1 g/dL (ref 30.0–36.0)
MCV: 76.1 fL — ABNORMAL LOW (ref 78.0–100.0)
RDW: 15.4 % (ref 11.5–15.5)

## 2012-04-20 LAB — POCT I-STAT TROPONIN I: Troponin i, poc: 0 ng/mL (ref 0.00–0.08)

## 2012-04-20 LAB — LIPASE, BLOOD: Lipase: 18 U/L (ref 11–59)

## 2012-04-20 MED ORDER — FOLIC ACID 1 MG PO TABS
1.0000 mg | ORAL_TABLET | Freq: Every day | ORAL | Status: DC
  Administered 2012-04-21 – 2012-04-23 (×2): 1 mg via ORAL
  Filled 2012-04-20 (×3): qty 1

## 2012-04-20 MED ORDER — GI COCKTAIL ~~LOC~~
30.0000 mL | Freq: Two times a day (BID) | ORAL | Status: DC | PRN
  Filled 2012-04-20 (×3): qty 30

## 2012-04-20 MED ORDER — HYDROMORPHONE HCL PF 1 MG/ML IJ SOLN: 1.0000 mg | INTRAMUSCULAR | Status: DC | PRN

## 2012-04-20 MED ORDER — SODIUM CHLORIDE 0.9 % IV SOLN
INTRAVENOUS | Status: DC
  Administered 2012-04-20 (×3): via INTRAVENOUS

## 2012-04-20 MED ORDER — GUAIFENESIN-DM 100-10 MG/5ML PO SYRP: 5.0000 mL | ORAL_SOLUTION | ORAL | Status: DC | PRN

## 2012-04-20 MED ORDER — SODIUM CHLORIDE 0.9 % IV SOLN: INTRAVENOUS | Status: DC

## 2012-04-20 MED ORDER — PANTOPRAZOLE SODIUM 40 MG PO TBEC
40.0000 mg | DELAYED_RELEASE_TABLET | Freq: Two times a day (BID) | ORAL | Status: DC
  Administered 2012-04-20 – 2012-04-21 (×2): 40 mg via ORAL
  Filled 2012-04-20 (×4): qty 1

## 2012-04-20 MED ORDER — HYDROMORPHONE HCL PF 1 MG/ML IJ SOLN
1.0000 mg | INTRAMUSCULAR | Status: DC | PRN
  Administered 2012-04-20 (×2): 1 mg via INTRAVENOUS
  Administered 2012-04-21 (×4): 2 mg via INTRAVENOUS
  Filled 2012-04-20: qty 1
  Filled 2012-04-20 (×2): qty 2
  Filled 2012-04-20: qty 1
  Filled 2012-04-20 (×2): qty 2
  Filled 2012-04-20: qty 1

## 2012-04-20 MED ORDER — HYDROCODONE-ACETAMINOPHEN 5-325 MG PO TABS
1.0000 | ORAL_TABLET | ORAL | Status: DC | PRN
  Administered 2012-04-20 – 2012-04-21 (×3): 2 via ORAL
  Filled 2012-04-20 (×3): qty 2

## 2012-04-20 MED ORDER — ALUM & MAG HYDROXIDE-SIMETH 200-200-20 MG/5ML PO SUSP: 30.0000 mL | Freq: Four times a day (QID) | ORAL | Status: DC | PRN

## 2012-04-20 MED ORDER — ONDANSETRON HCL 4 MG/2ML IJ SOLN: 4.0000 mg | Freq: Four times a day (QID) | INTRAMUSCULAR | Status: DC | PRN

## 2012-04-20 MED ORDER — ACETAMINOPHEN 325 MG PO TABS: 650.0000 mg | ORAL_TABLET | Freq: Four times a day (QID) | ORAL | Status: DC | PRN

## 2012-04-20 MED ORDER — NICOTINE 14 MG/24HR TD PT24
14.0000 mg | MEDICATED_PATCH | Freq: Every day | TRANSDERMAL | Status: DC
  Administered 2012-04-21: 14 mg via TRANSDERMAL
  Filled 2012-04-20 (×2): qty 1

## 2012-04-20 MED ORDER — HYDROMORPHONE HCL PF 1 MG/ML IJ SOLN
1.0000 mg | Freq: Once | INTRAMUSCULAR | Status: AC
  Administered 2012-04-20: 1 mg via INTRAVENOUS
  Filled 2012-04-20: qty 1

## 2012-04-20 MED ORDER — GI COCKTAIL ~~LOC~~
30.0000 mL | Freq: Once | ORAL | Status: AC
  Administered 2012-04-20: 30 mL via ORAL
  Filled 2012-04-20: qty 30

## 2012-04-20 MED ORDER — ONDANSETRON HCL 4 MG PO TABS: 4.0000 mg | ORAL_TABLET | Freq: Four times a day (QID) | ORAL | Status: DC | PRN

## 2012-04-20 MED ORDER — SODIUM CHLORIDE 0.9 % IJ SOLN
3.0000 mL | Freq: Two times a day (BID) | INTRAMUSCULAR | Status: DC
  Administered 2012-04-23: 3 mL via INTRAVENOUS

## 2012-04-20 MED ORDER — ACETAMINOPHEN 650 MG RE SUPP: 650.0000 mg | Freq: Four times a day (QID) | RECTAL | Status: DC | PRN

## 2012-04-20 MED ORDER — POTASSIUM CHLORIDE CRYS ER 20 MEQ PO TBCR
40.0000 meq | EXTENDED_RELEASE_TABLET | Freq: Once | ORAL | Status: AC
  Administered 2012-04-20: 40 meq via ORAL
  Filled 2012-04-20: qty 2

## 2012-04-20 MED ORDER — ALBUTEROL SULFATE (5 MG/ML) 0.5% IN NEBU: 2.5000 mg | INHALATION_SOLUTION | RESPIRATORY_TRACT | Status: DC | PRN

## 2012-04-20 NOTE — ED Notes (Signed)
Report called to [facility]

## 2012-04-20 NOTE — ED Provider Notes (Signed)
History     CSN: [id number]  Arrival date & time 04/20/12  1019   First MD Initiated Contact with Patient 04/20/12 1102      No chief complaint on file.   (Consider location/radiation/quality/duration/timing/severity/associated sxs/prior treatment) The history is provided by the patient and a relative.  26 y/o M with PMH sickle cell disease and GERD presents to ED with c/c of sickle cell crisis pain as well as abdominal pain that he feels is related to reflux. Sickle cell pain began yesterday, in the back and chest. Assoc with SOB yesterday but none today. There has been a cough with purulent sputum, but no fever. No hx acute chest. Typically takes percocet, which has not controlled his pain. Abd pain also began yesterday, generalized but worse to the upper abd, assoc with nausea, yesterday with emesis. No BM since pain began. No prior abd surgeries. Was seen in ED yesterday for both complaints with good improvement of symptoms after both GI cocktail and narcotic pain medications. Sickle cell clinic was apparently closed yesterday and he was sent home from ED after pain improvement.  Past Medical History  Diagnosis Date  . Sickle cell anemia     No past surgical history on file.  No family history on file.  History  Substance Use Topics  . Smoking status: Current Everyday Smoker -- 0.5 packs/day  . Smokeless tobacco: Not on file  . Alcohol Use: Yes      Review of Systems 10 systems reviewed and are negative for acute change except as noted in the HPI.  Allergies  Review of patient's allergies indicates no known allergies.  Home Medications   Current Outpatient Rx  Name Route Sig Dispense Refill  . ASPIRIN-ACETAMINOPHEN-CAFFEINE 250-250-65 MG PO TABS Oral Take 1 tablet by mouth every 6 (six) hours as needed. PAIN    . FOLIC ACID 1 MG PO TABS Oral Take 1 tablet (1 mg total) by mouth daily. 100 tablet 0  . OXYCODONE-ACETAMINOPHEN 5-325 MG PO TABS Oral Take 1 tablet by mouth  every 4 (four) hours as needed for pain. 20 tablet 0    BP 132/73  Pulse 102  Temp(Src) 98.2 F (36.8 C) (Oral)  Resp 20  SpO2 97%  Physical Exam  Constitutional: He appears well-developed and well-nourished. He appears distressed (pacing around room).       Vital signs are reviewed and are normal.   HENT:  Head: Normocephalic and atraumatic.       MMM  Eyes: Pupils are equal, round, and reactive to light.  Neck: Neck supple.  Cardiovascular: Normal rate, regular rhythm and normal heart sounds.        Bilateral radial and DP pulses are 2+   Pulmonary/Chest: Effort normal and breath sounds normal. No respiratory distress. He has no wheezes. He has no rales. He exhibits tenderness.  Abdominal: Soft. Bowel sounds are normal. He exhibits no distension. There is tenderness (diffuse). There is guarding. There is no rebound.  Musculoskeletal: He exhibits no edema and no tenderness.  Neurological: He is alert.  Skin: Skin is warm and dry.    ED Course  Procedures (including critical care time)  Labs Reviewed  CBC - Abnormal; Notable for the following:    Hemoglobin 12.3 (*)    HCT 35.0 (*)    MCV 76.1 (*)    All other components within normal limits  COMPREHENSIVE METABOLIC PANEL - Abnormal; Notable for the following:    Potassium 3.1 (*)    Glucose,  Bld 117 (*)    BUN 4 (*)    All other components within normal limits  LIPASE, BLOOD  RETICULOCYTES   Dg Chest 2 View  04/20/2012  *RADIOLOGY REPORT*  Clinical Data: Back pain, sickle cell crisis  CHEST - 2 VIEW  Comparison: 04/19/12  Findings: Cardiomediastinal silhouette is stable.  No acute infiltrate or pulmonary edema.  Stable H shaped vertebra mid thoracic spine.  IMPRESSION: No active disease.  No significant change.  Original Report Authenticated By: Natasha Mead, M.D.   Dg Chest 2 View  04/19/2012  *RADIOLOGY REPORT*  Clinical Data: Sickle cell crisis.  Mid chest pain.  CHEST - 2 VIEW  Comparison: Two-view chest 06/15/2011.   Findings: The heart size is normal.  The lungs are clear.  The visualized soft tissues and bony thorax are unremarkable.  IMPRESSION: Negative chest.  Original Report Authenticated By: Jamesetta Orleans. MATTERN, M.D.    Date: 04/20/2012  Rate: 71  Rhythm: sinus  QRS Axis: normal  Intervals: normal  ST/T Wave abnormalities: TWI leads III, aVF, V3, V4  Conduction Disutrbances:none  Narrative Interpretation: compared to ECG from yesterday, TWI appears new  Old EKG Reviewed: changes noted   Dx 1: Sickle cell pain crisis Dx 2: Hypokalemia  MDM  Sickle cell anemia, CP/back pain typical for sickle cell crisis, also epigastric pain he feels is related to reflux. No fever or SOB to suggest acute chest, CXR with no change since yesterday to suggest such. Labs with mild anemia, hypokalemia. Otherwise unremarkable. Negative troponin in the setting of fairly constant pain since last night though there appear to be EKG changes on today's tracing as compared to yesterday. Pain not well-controlled after 3 doses IV dilaudid. Hospitalist consulted for admission for sickle cell pain crisis and further eval of EKG changes.  4:47 PM Note- a prior EKG from 06/15/2011 is now evaluated and demonstrates TWI in the same leads as seen today. Admitting MD has been notified.        Shaaron Adler, [place] 04/20/12 [phone]

## 2012-04-20 NOTE — H&P (Signed)
History and Physical       Hospital Admission Note Date: 04/20/2012  Patient name: James Hamilton Medical record number: [id number] Date of birth: 1986-04-24 Age: 26 y.o. Gender: male PCP: No primary provider on file.   Chief Complaint:  Chest pain, back pain since yesterday  HPI: Patient is a 26 year old male with past medical history of sickle cell disease and GERD presented to [facility] ED with complaints of epigastric pain radiating to chest and back pain. Patient states that the back pain is consistent with his sickle cell crisis which started yesterday. He came to the ED yesterday however his pain improved and he was sent home. Patient is accompanied with his mother who also stated that he had difficult time at night due to pain which was not controlled with Percocet. Patient also complains of epigastric pain and chest pain which feels as 'heartburn'. Denies any acute shortness of breath, palpitations, any radiation of the chest pain. He rates the chest and back pain is 6/10 in intensity, constant and sharp. Patient also reports nausea with one episode of vomiting yesterday. He denies any fevers however states that he has some cough with purulent sputum. Chest x-ray in ED did not show any acute pneumonia, patient has no leukocytosis or fevers.   Review of Systems:  Constitutional: Denies fever, chills, diaphoresis, appetite change and fatigue.  HEENT: Denies photophobia, eye pain, redness, hearing loss, ear pain, congestion, sore throat, rhinorrhea, sneezing, mouth sores, trouble swallowing, neck pain, neck stiffness and tinnitus.   Respiratory: See history of present illness    Cardiovascular: See history of present illness   Gastrointestinal: Denies diarrhea, constipation, blood in stool and abdominal distention. + patient endorses having nausea and vomiting with epigastric pain Genitourinary: Denies dysuria, urgency, frequency,  hematuria, flank pain and difficulty urinating.  Musculoskeletal: See history of present illness  Skin: Denies pallor, rash and wound.  Neurological: Denies dizziness, seizures, syncope, weakness, light-headedness, numbness and headaches.  Hematological: Denies adenopathy. Easy bruising, personal or family bleeding history  Psychiatric/Behavioral: Denies suicidal ideation, mood changes, confusion, nervousness, sleep disturbance and agitation  Past Medical History: Past Medical History  Diagnosis Date  . Sickle cell anemia    History reviewed. No pertinent past surgical history.  Medications: Prior to Admission medications   Medication Sig Start Date End Date Taking? Authorizing Provider  aspirin-acetaminophen-caffeine (EXCEDRIN MIGRAINE) [phone] MG per tablet Take 1 tablet by mouth every 6 (six) hours as needed. PAIN   Yes Historical Provider, MD  folic acid (FOLVITE) 1 MG tablet Take 1 tablet (1 mg total) by mouth daily. 02/29/12 02/28/13 Yes Gwenyth Bender, MD  oxyCODONE-acetaminophen (PERCOCET) 5-325 MG per tablet Take 1 tablet by mouth every 4 (four) hours as needed for pain. 04/19/12 04/29/12 Yes Dione Booze, MD    Allergies:  No Known Allergies  Social History:  reports that he has been smoking.  He states that he smokes cigars every day, drinks alcohol occasionally and smokes marijuana   Family History: History reviewed. No pertinent family history.  Physical Exam: Blood pressure 129/85, pulse 75, temperature 99 F (37.2 C), temperature source Oral, resp. rate 22, SpO2 98.00%. General: Alert, awake, oriented x3, in no acute distress. HEENT: anicteric sclera, pink conjunctiva, pupils equal and reactive to light and accomodation Neck: supple, no masses or lymphadenopathy, no goiter, no bruits  Heart: Regular rate and rhythm, without murmurs, rubs or gallops. Lungs: Clear to auscultation bilaterally, no wheezing, rales or rhonchi. Chest wall tenderness noticed with mild  epigastric tenderness Abdomen: Soft, mild epigastric tenderness  nondistended, positive bowel sounds, no masses. Extremities: No clubbing, cyanosis or edema with positive pedal pulses. Neuro: Grossly intact, no focal neurological deficits, strength 5/5 upper and lower extremities bilaterally Psych: alert and oriented x 3, normal mood and affect Skin: no rashes or lesions, warm and dry   LABS on Admission:  Basic Metabolic Panel:  Lab 04/20/12 2130 04/19/12 0811  NA 135 140  K 3.1* 3.5  CL 99 104  CO2 21 23  GLUCOSE 117* 84  BUN 4* 9  CREATININE 0.93 1.02  CALCIUM 8.9 9.1  MG -- --  PHOS -- --   Liver Function Tests:  Lab 04/20/12 1126 04/19/12 0811  AST 28 19  ALT 16 11  ALKPHOS 117 103  BILITOT 0.5 0.4  PROT 7.0 7.4  ALBUMIN 3.6 3.8    Lab 04/20/12 1126  LIPASE 18  AMYLASE --   CBC:  Lab 04/20/12 1126 04/19/12 0811  WBC 9.6 9.4  NEUTROABS -- 6.6  HGB 12.3* 13.4  HCT 35.0* 38.4*  MCV 76.1* --  PLT 163 244     Radiological Exams on Admission: Dg Chest 2 View  04/20/2012  *RADIOLOGY REPORT*  Clinical Data: Back pain, sickle cell crisis  CHEST - 2 VIEW  Comparison: 04/19/12  Findings: Cardiomediastinal silhouette is stable.  No acute infiltrate or pulmonary edema.  Stable H shaped vertebra mid thoracic spine.  IMPRESSION: No active disease.  No significant change.  Original Report Authenticated By: Natasha Mead, M.D.   Dg Chest 2 View  04/19/2012  *RADIOLOGY REPORT*  Clinical Data: Sickle cell crisis.  Mid chest pain.  CHEST - 2 VIEW  Comparison: Two-view chest 06/15/2011.  Findings: The heart size is normal.  The lungs are clear.  The visualized soft tissues and bony thorax are unremarkable.  IMPRESSION: Negative chest.  Original Report Authenticated By: Jamesetta Orleans. MATTERN, M.D.    Assessment/Plan Present on Admission:   .Atypical chest pain: Likely secondary to GERD or acid reflux with acute sickle cell crisis. EKG is reviewed, it appeared that patient had T  wave inversions in lead 3, aVF, V3, V4 but on comparing to the prior EKG in August 2012, does not appear to be new. - Admit to telemetry, obtain 3 sets of cardiac enzymes, if positive, will obtain 2-D echocardiogram and discuss with cardiology  - Place on PPI, IV fluids, pain control   .Sickle cell crisis: Per patient back pain is typical of secured sickle cell crisis  - Will place on IV Dilaudid and PRN percocet, O2 via nasal cannula, IV fluids, folic acid   .Hypokalemia: Replaced in ED   .Tobacco abuse - Placed on Nicoderm patch, counseled strongly for tobacco cessation   .Dehydration/ Nausea & vomiting - Placed on full liquid diet, IV fluids, Zofran   .GERD (gastroesophageal reflux disease): Will place on GI cocktail, PPI twice a day  DVT prophylaxis: SCDs  CODE STATUS: Full code  Further plan will depend as patient's clinical course evolves and further radiologic and laboratory data become available.   @Time  Spent on Admission: 1 hour Angela Vazguez M.D. [facility] 04/20/2012, 5:09 PM

## 2012-04-20 NOTE — ED Notes (Signed)
Report called to floor RN

## 2012-04-20 NOTE — ED Notes (Signed)
Went to collect 16:31 labs - pt requests that we wait until he has pain medication.

## 2012-04-21 ENCOUNTER — Encounter (HOSPITAL_COMMUNITY): Payer: Self-pay | Admitting: *Deleted

## 2012-04-21 ENCOUNTER — Inpatient Hospital Stay (HOSPITAL_COMMUNITY): Payer: Medicaid Other

## 2012-04-21 DIAGNOSIS — J40 Bronchitis, not specified as acute or chronic: Secondary | ICD-10-CM

## 2012-04-21 LAB — CARDIAC PANEL(CRET KIN+CKTOT+MB+TROPI)
Relative Index: 0.8 (ref 0.0–2.5)
Relative Index: 0.9 (ref 0.0–2.5)
Total CK: 162 U/L (ref 7–232)
Total CK: 149 U/L (ref 7–232)
Troponin I: <0.3 ng/mL (ref <0.30)

## 2012-04-21 LAB — BASIC METABOLIC PANEL
CO2: 21 mEq/L (ref 19–32)
Chloride: 101 mEq/L (ref 96–112)
Glucose, Bld: 101 mg/dL — ABNORMAL HIGH (ref 70–99)
Potassium: 3.4 mEq/L — ABNORMAL LOW (ref 3.5–5.1)
Sodium: 135 mEq/L (ref 135–145)

## 2012-04-21 LAB — CBC
Hemoglobin: 11.3 g/dL — ABNORMAL LOW (ref 13.0–17.0)
MCH: 26.3 pg (ref 26.0–34.0)
MCV: 76.3 fL — ABNORMAL LOW (ref 78.0–100.0)
RBC: 4.3 MIL/uL (ref 4.22–5.81)

## 2012-04-21 MED ORDER — SENNOSIDES-DOCUSATE SODIUM 8.6-50 MG PO TABS
1.0000 | ORAL_TABLET | Freq: Every day | ORAL | Status: DC
  Administered 2012-04-21 – 2012-04-22 (×2): 1 via ORAL
  Filled 2012-04-21 (×3): qty 1

## 2012-04-21 MED ORDER — MORPHINE SULFATE 4 MG/ML IJ SOLN
8.0000 mg | INTRAMUSCULAR | Status: DC | PRN
  Administered 2012-04-21 – 2012-04-22 (×7): 8 mg via INTRAVENOUS
  Filled 2012-04-21 (×7): qty 2

## 2012-04-21 MED ORDER — PANTOPRAZOLE SODIUM 40 MG PO TBEC
40.0000 mg | DELAYED_RELEASE_TABLET | Freq: Every day | ORAL | Status: DC
  Administered 2012-04-21 – 2012-04-23 (×3): 40 mg via ORAL
  Filled 2012-04-21 (×2): qty 1

## 2012-04-21 MED ORDER — PNEUMOCOCCAL VAC POLYVALENT 25 MCG/0.5ML IJ INJ
0.5000 mL | INJECTION | INTRAMUSCULAR | Status: AC
  Administered 2012-04-22: 0.5 mL via INTRAMUSCULAR
  Filled 2012-04-21: qty 0.5

## 2012-04-21 MED ORDER — LEVALBUTEROL HCL 0.63 MG/3ML IN NEBU
0.6300 mg | INHALATION_SOLUTION | Freq: Three times a day (TID) | RESPIRATORY_TRACT | Status: DC
  Administered 2012-04-21 (×2): 0.63 mg via RESPIRATORY_TRACT
  Filled 2012-04-21 (×3): qty 3

## 2012-04-21 MED ORDER — OXYCODONE HCL 10 MG PO TB12: 10.0000 mg | ORAL_TABLET | Freq: Two times a day (BID) | ORAL | Status: DC

## 2012-04-21 MED ORDER — KCL IN DEXTROSE-NACL 20-5-0.2 MEQ/L-%-% IV SOLN
INTRAVENOUS | Status: DC
  Administered 2012-04-21 – 2012-04-23 (×3): via INTRAVENOUS
  Filled 2012-04-21 (×3): qty 1000

## 2012-04-21 MED ORDER — ONDANSETRON HCL 4 MG/2ML IJ SOLN
4.0000 mg | INTRAMUSCULAR | Status: DC | PRN
  Filled 2012-04-21: qty 2

## 2012-04-21 MED ORDER — BISMUTH SUBSALICYLATE 262 MG PO CHEW: 524.0000 mg | CHEWABLE_TABLET | Freq: Three times a day (TID) | ORAL | Status: DC

## 2012-04-21 MED ORDER — DM-GUAIFENESIN ER 30-600 MG PO TB12
1.0000 | ORAL_TABLET | Freq: Two times a day (BID) | ORAL | Status: DC
  Administered 2012-04-21 – 2012-04-23 (×5): 1 via ORAL
  Filled 2012-04-21 (×6): qty 1

## 2012-04-21 MED ORDER — KETOROLAC TROMETHAMINE 15 MG/ML IJ SOLN: 15.0000 mg | Freq: Three times a day (TID) | INTRAMUSCULAR | Status: DC | PRN

## 2012-04-21 MED ORDER — HYDROCODONE-ACETAMINOPHEN 5-325 MG PO TABS
1.0000 | ORAL_TABLET | ORAL | Status: DC | PRN
  Administered 2012-04-21: 2 via ORAL
  Filled 2012-04-21 (×2): qty 2

## 2012-04-21 MED ORDER — BISMUTH SUBSALICYLATE 262 MG/15ML PO SUSP
30.0000 mL | Freq: Three times a day (TID) | ORAL | Status: DC
  Administered 2012-04-21 – 2012-04-22 (×4): 30 mL via ORAL
  Filled 2012-04-21: qty 236

## 2012-04-21 MED ORDER — AZITHROMYCIN 500 MG PO TABS
500.0000 mg | ORAL_TABLET | ORAL | Status: DC
  Administered 2012-04-21 – 2012-04-23 (×3): 500 mg via ORAL
  Filled 2012-04-21 (×3): qty 1

## 2012-04-21 MED ORDER — OXYCODONE HCL 10 MG PO TB12
10.0000 mg | ORAL_TABLET | Freq: Two times a day (BID) | ORAL | Status: DC
  Administered 2012-04-22: 10 mg via ORAL
  Filled 2012-04-21: qty 1

## 2012-04-21 MED ORDER — LIDOCAINE 5 % EX PTCH
2.0000 | MEDICATED_PATCH | CUTANEOUS | Status: DC
  Administered 2012-04-21 – 2012-04-23 (×3): 2 via TRANSDERMAL
  Filled 2012-04-21 (×3): qty 2

## 2012-04-21 MED ORDER — KETOROLAC TROMETHAMINE 15 MG/ML IJ SOLN
15.0000 mg | Freq: Three times a day (TID) | INTRAMUSCULAR | Status: DC
  Administered 2012-04-21 – 2012-04-22 (×3): 15 mg via INTRAVENOUS
  Filled 2012-04-21 (×5): qty 1

## 2012-04-21 MED ORDER — DIPHENHYDRAMINE HCL 50 MG/ML IJ SOLN
12.5000 mg | INTRAMUSCULAR | Status: DC | PRN
  Administered 2012-04-22: 25 mg via INTRAVENOUS
  Administered 2012-04-22 – 2012-04-23 (×2): 12.5 mg via INTRAVENOUS
  Filled 2012-04-21 (×3): qty 1

## 2012-04-21 MED ORDER — ONDANSETRON HCL 4 MG PO TABS: 4.0000 mg | ORAL_TABLET | ORAL | Status: DC | PRN

## 2012-04-21 MED ORDER — POTASSIUM CHLORIDE CRYS ER 20 MEQ PO TBCR
40.0000 meq | EXTENDED_RELEASE_TABLET | Freq: Once | ORAL | Status: AC
  Administered 2012-04-21: 40 meq via ORAL
  Filled 2012-04-21 (×2): qty 2

## 2012-04-21 MED ORDER — SODIUM CHLORIDE 0.9 % IJ SOLN
10.0000 mL | INTRAMUSCULAR | Status: DC | PRN
  Administered 2012-04-21 – 2012-04-23 (×3): 10 mL

## 2012-04-21 NOTE — Progress Notes (Signed)
Subjective: The patient was seen on rounds today.  The patient was sitting quietly in the reclining chair.  The patient is complaining of pain 6-8/10.  The patient has diffuse abdominal pain, diffuse back pain and has a productive cough which produces "dark" sputum.  The patient is also complaining of severe GERD. Spoke to the patient at length and in detail about his plan of care.  The patient agreed to receive a PICC line, but declined a PCA pump for pain management.  The patient believes a recent physical exertion game of basketball is what triggered his current crisis.  No other nursing or patient concerns.   Objective: Vital signs in last 24 hours: Blood pressure 130/86, pulse 94, temperature 98.9 F (37.2 C), temperature source Oral, resp. rate 18, height 6\' 1"  (1.854 m), weight 157 lb (71.215 kg), SpO2 97.00%.  General Appearance: Alert, cooperative, well nourished, well developed, moderate distress Head: Normocephalic, without obvious abnormality, atraumatic Eyes: Conjunctivae clear, PERRLA, EOMI, scleral icterus Nose: Nares, septum and mucosa are normal, no visible drainage, no sinus tenderness. Throat: Lips, mucosa, tongue, teeth and gums are normal Neck: No adenopathy, supple, symmetrical, trachea midline, thyroid not enlarged, symmetric, no tenderness Back: Symmetric, impaired ROM, bilateral CVA tenderness, diffuse tenderness Resp: Scattered rhonchi throughout, diminished bibasilar breath sounds, no wheezes Cardio: Regular rate and rhythm, S1, S2 normal, no murmur, click, rub or gallop GI: Soft, diffuse tenderness, hypoactive bowel sounds, no masses, no organomegaly Male Genitalia: Deferred, the patient denies priapism Extremities: Extremities normal, atraumatic, no cyanosis, no edema, Homans sign is negative, no sign of DVT, tender bilateral UEs Pulses: 2+ and symmetric Skin: Skin color, texture, turgor are normal, no rashes or lesions Neurologic: Grossly normal, no focal  deficits, CN II - XII intact Psych:  Appropriate affect  Lab Results: Results for orders placed during the hospital encounter of 04/20/12 (from the past 24 hour(s))  POCT I-STAT TROPONIN I     Status: Normal   Collection Time   04/20/12  2:59 PM      Component Value Range   Troponin i, poc 0.00  0.00 - 0.08 (ng/mL)   Comment 3           TROPONIN I     Status: Normal   Collection Time   04/20/12  7:40 PM      Component Value Range   Troponin I <0.30  <0.30 (ng/mL)  CARDIAC PANEL(CRET KIN+CKTOT+MB+TROPI)     Status: Normal   Collection Time   04/21/12  2:15 AM      Component Value Range   Total CK 162  7 - 232 (U/L)   CK, MB 1.3  0.3 - 4.0 (ng/mL)   Troponin I <0.30  <0.30 (ng/mL)   Relative Index 0.8  0.0 - 2.5   BASIC METABOLIC PANEL     Status: Abnormal   Collection Time   04/21/12  2:15 AM      Component Value Range   Sodium 135  135 - 145 (mEq/L)   Potassium 3.4 (*) 3.5 - 5.1 (mEq/L)   Chloride 101  96 - 112 (mEq/L)   CO2 21  19 - 32 (mEq/L)   Glucose, Bld 101 (*) 70 - 99 (mg/dL)   BUN 3 (*) 6 - 23 (mg/dL)   Creatinine, Ser 1.61  0.50 - 1.35 (mg/dL)   Calcium 8.5  8.4 - 09.6 (mg/dL)   GFR calc non Af Amer >90  >90 (mL/min)   GFR calc Af Amer >90  >90 (  mL/min)  CBC     Status: Abnormal   Collection Time   04/21/12  2:15 AM      Component Value Range   WBC 7.8  4.0 - 10.5 (K/uL)   RBC 4.30  4.22 - 5.81 (MIL/uL)   Hemoglobin 11.3 (*) 13.0 - 17.0 (g/dL)   HCT 47.8 (*) 29.5 - 52.0 (%)   MCV 76.3 (*) 78.0 - 100.0 (fL)   MCH 26.3  26.0 - 34.0 (pg)   MCHC 34.5  30.0 - 36.0 (g/dL)   RDW 62.1  30.8 - 65.7 (%)   Platelets 117 (*) 150 - 400 (K/uL)  CARDIAC PANEL(CRET KIN+CKTOT+MB+TROPI)     Status: Normal   Collection Time   04/21/12 10:02 AM      Component Value Range   Total CK 149  7 - 232 (U/L)   CK, MB 1.3  0.3 - 4.0 (ng/mL)   Troponin I <0.30  <0.30 (ng/mL)   Relative Index 0.9  0.0 - 2.5     Studies/Results: Dg Chest 2 View  04/20/2012  *RADIOLOGY REPORT*  Clinical  Data: Back pain, sickle cell crisis  CHEST - 2 VIEW  Comparison: 04/19/12  Findings: Cardiomediastinal silhouette is stable.  No acute infiltrate or pulmonary edema.  Stable H shaped vertebra mid thoracic spine.  IMPRESSION: No active disease.  No significant change.  Original Report Authenticated By: Natasha Mead, M.D.    Medications:  No Known Allergies   Current Facility-Administered Medications  Medication Dose Route Frequency Provider Last Rate Last Dose  . acetaminophen (TYLENOL) tablet 650 mg  650 mg Oral Q6H PRN Ripudeep Jenna Luo, MD       Or  . acetaminophen (TYLENOL) suppository 650 mg  650 mg Rectal Q6H PRN Ripudeep Jenna Luo, MD      . azithromycin (ZITHROMAX) tablet 500 mg  500 mg Oral Q24H Keitha Butte, NP      . bismuth subsalicylate (PEPTO BISMOL) 262 MG/15ML suspension 30 mL  30 mL Oral TID AC & HS Gwenyth Bender, MD      . dextromethorphan-guaiFENesin (MUCINEX DM) 30-600 MG per 12 hr tablet 1 tablet  1 tablet Oral BID Keitha Butte, NP      . dextrose 5 % and 0.2 % NaCl with KCl 20 mEq infusion   Intravenous Continuous Keitha Butte, NP      . diphenhydrAMINE (BENADRYL) injection 12.5-25 mg  12.5-25 mg Intravenous Q4H PRN Keitha Butte, NP      . folic acid (FOLVITE) tablet 1 mg  1 mg Oral Daily Ripudeep K Rai, MD      . gi cocktail (Maalox,Lidocaine,Donnatal)  30 mL Oral BID PRN Ripudeep Jenna Luo, MD      . HYDROcodone-acetaminophen (NORCO) 5-325 MG per tablet 1-2 tablet  1-2 tablet Oral Q4H PRN Keitha Butte, NP      . HYDROmorphone (DILAUDID) injection 1 mg  1 mg Intravenous Once [employer], PA-C   1 mg at 04/20/12 1542  . ketorolac (TORADOL) 15 MG/ML injection 15 mg  15 mg Intravenous Q8H Keitha Butte, NP      . levalbuterol (XOPENEX) nebulizer solution 0.63 mg  0.63 mg Nebulization Q8H Keitha Butte, NP      . lidocaine (LIDODERM) 5 % 2 patch  2 patch Transdermal Q24H Keitha Butte, NP      . morphine 4 MG/ML injection 8-10  mg  8-10 mg Intravenous Q2H PRN Keitha Butte, NP      .  ondansetron (ZOFRAN) injection 4 mg  4 mg Intravenous Q4H PRN Keitha Butte, NP       Or  . ondansetron (ZOFRAN) tablet 4-8 mg  4-8 mg Oral Q4H PRN Keitha Butte, NP      . oxyCODONE (OXYCONTIN) 12 hr tablet 10 mg  10 mg Oral Q12H Keitha Butte, NP      . pantoprazole (PROTONIX) EC tablet 40 mg  40 mg Oral Q1200 Keitha Butte, NP      . pneumococcal 23 valent vaccine (PNU-IMMUNE) injection 0.5 mL  0.5 mL Intramuscular Tomorrow-1000 Ripudeep K Rai, MD      . potassium chloride SA (K-DUR,KLOR-CON) CR tablet 40 mEq  40 mEq Oral Once Keitha Butte, NP      . senna-docusate (Senokot-S) tablet 1 tablet  1 tablet Oral QHS Keitha Butte, NP      . sodium chloride 0.9 % injection 3 mL  3 mL Intravenous Q12H Ripudeep K Rai, MD      . DISCONTD: 0.9 %  sodium chloride infusion   Intravenous Continuous Shaaron Adler, PA-C 125 mL/hr at 04/21/12 0400    . DISCONTD: 0.9 %  sodium chloride infusion   Intravenous STAT Shaaron Adler, PA-C      . DISCONTD: 0.9 %  sodium chloride infusion   Intravenous Continuous Ripudeep Jenna Luo, MD      . DISCONTD: albuterol (PROVENTIL) (5 MG/ML) 0.5% nebulizer solution 2.5 mg  2.5 mg Nebulization Q2H PRN Ripudeep Jenna Luo, MD      . DISCONTD: alum & mag hydroxide-simeth (MAALOX/MYLANTA) 200-200-20 MG/5ML suspension 30 mL  30 mL Oral Q6H PRN Ripudeep Jenna Luo, MD      . DISCONTD: bismuth subsalicylate (PEPTO BISMOL) chewable tablet 524 mg  524 mg Oral TID AC & HS Keitha Butte, NP      . DISCONTD: guaiFENesin-dextromethorphan (ROBITUSSIN DM) 100-10 MG/5ML syrup 5 mL  5 mL Oral Q4H PRN Ripudeep Jenna Luo, MD      . DISCONTD: HYDROcodone-acetaminophen (NORCO) 5-325 MG per tablet 1-2 tablet  1-2 tablet Oral Q4H PRN Ripudeep Jenna Luo, MD   2 tablet at 04/21/12 1148  . DISCONTD: HYDROmorphone (DILAUDID) injection 1 mg  1 mg Intravenous Q3H PRN Shaaron Adler, PA-C      .  DISCONTD: HYDROmorphone (DILAUDID) injection 1-2 mg  1-2 mg Intravenous Q3H PRN Ripudeep Jenna Luo, MD   2 mg at 04/21/12 0952  . DISCONTD: ketorolac (TORADOL) 15 MG/ML injection 15 mg  15 mg Intravenous Q8H PRN Keitha Butte, NP      . DISCONTD: nicotine (NICODERM CQ - dosed in mg/24 hours) patch 14 mg  14 mg Transdermal Daily Ripudeep Jenna Luo, MD   14 mg at 04/21/12 0353  . DISCONTD: ondansetron (ZOFRAN) injection 4 mg  4 mg Intravenous Q6H PRN Ripudeep K Rai, MD      . DISCONTD: ondansetron (ZOFRAN) tablet 4 mg  4 mg Oral Q6H PRN Ripudeep Jenna Luo, MD      . DISCONTD: pantoprazole (PROTONIX) EC tablet 40 mg  40 mg Oral BID AC Ripudeep Jenna Luo, MD   40 mg at 04/21/12 0865    Assessment/Plan: Patient Active Problem List  Diagnoses  . Atypical chest pain  . Sickle cell crisis  . Hypokalemia  . Tobacco abuse  . Dehydration  . Nausea & vomiting  . GERD (gastroesophageal reflux disease)  . Bronchitis   Sickle Cell Crisis:  The patient is scheduled to  receive a PICC line.  The patient will continue receiving pain/nausea/pruritis/bowel management, IV hydration, DVT/GI prophylaxis and home medications. CMP, CBC, ProBNP, Mg, Phos, Hemoglobinopathy and Ferritin in the am. Hypokalemia:  The patient received PO supplementation today and IVFs will contain KCL - Will continue to monitor Tobacco abuse:  The patient is receiving a nicotine patch daily GERD:  The patient is receiving GI prophylaxis daily in addition to GI cocktail available PRN and Bismuth TID/AC Sickle Chest Syndrome/Bronchitis:  The patient will be started on Azithromycin antibiotic therapy in addition to scheduled nebulizer treatments and Mucinex DM BID  Discussed and agreed upon plan of care with the patient.   The plan of care will be adjusted based on the patient's clinical progress.   Larina Bras, NP-C 04/21/2012, 1:35 PM

## 2012-04-21 NOTE — Progress Notes (Signed)
Peripherally Inserted Central Catheter/Midline Placement  The IV Nurse has discussed with the patient and/or persons authorized to consent for the patient, the purpose of this procedure and the potential benefits and risks involved with this procedure.  The benefits include less needle sticks, lab draws from the catheter and patient may be discharged home with the catheter.  Risks include, but not limited to, infection, bleeding, blood clot (thrombus formation), and puncture of an artery; nerve damage and irregular heat beat.  Alternatives to this procedure were also discussed.  PICC/Midline Placement Documentation        Stacie Glaze Horton 04/21/2012, 3:15 PM

## 2012-04-22 DIAGNOSIS — R7989 Other specified abnormal findings of blood chemistry: Secondary | ICD-10-CM

## 2012-04-22 LAB — COMPREHENSIVE METABOLIC PANEL
Alkaline Phosphatase: 108 U/L (ref 39–117)
BUN: 4 mg/dL — ABNORMAL LOW (ref 6–23)
GFR calc Af Amer: >90 mL/min (ref >90)
Glucose, Bld: 110 mg/dL — ABNORMAL HIGH (ref 70–99)
Potassium: 3.5 mEq/L (ref 3.5–5.1)
Total Protein: 6.6 g/dL (ref 6.0–8.3)

## 2012-04-22 LAB — CBC
HCT: 31.1 % — ABNORMAL LOW (ref 39.0–52.0)
Hemoglobin: 10.8 g/dL — ABNORMAL LOW (ref 13.0–17.0)
MCH: 26.3 pg (ref 26.0–34.0)
MCHC: 34.7 g/dL (ref 30.0–36.0)
MCV: 75.7 fL — ABNORMAL LOW (ref 78.0–100.0)

## 2012-04-22 LAB — MAGNESIUM: Magnesium: 1.9 mg/dL (ref 1.5–2.5)

## 2012-04-22 LAB — PRO B NATRIURETIC PEPTIDE: Pro B Natriuretic peptide (BNP): 605.1 pg/mL — ABNORMAL HIGH (ref 0–125)

## 2012-04-22 MED ORDER — KETOROLAC TROMETHAMINE 15 MG/ML IJ SOLN
30.0000 mg | Freq: Three times a day (TID) | INTRAMUSCULAR | Status: AC
  Administered 2012-04-22 (×2): 30 mg via INTRAVENOUS
  Filled 2012-04-22: qty 2

## 2012-04-22 MED ORDER — POTASSIUM CHLORIDE CRYS ER 20 MEQ PO TBCR
40.0000 meq | EXTENDED_RELEASE_TABLET | Freq: Once | ORAL | Status: AC
  Administered 2012-04-22: 40 meq via ORAL
  Filled 2012-04-22: qty 2

## 2012-04-22 MED ORDER — ALBUMIN HUMAN 25 % IV SOLN
25.0000 g | Freq: Once | INTRAVENOUS | Status: AC
  Administered 2012-04-22: 25 g via INTRAVENOUS
  Filled 2012-04-22: qty 100

## 2012-04-22 MED ORDER — DEXTROSE 5 % IV SOLN
1.0000 g | INTRAVENOUS | Status: DC
  Administered 2012-04-22 – 2012-04-23 (×2): 1 g via INTRAVENOUS
  Filled 2012-04-22 (×2): qty 10

## 2012-04-22 MED ORDER — HYDROMORPHONE HCL PF 2 MG/ML IJ SOLN
2.0000 mg | INTRAMUSCULAR | Status: DC | PRN
  Administered 2012-04-22 (×3): 4 mg via INTRAVENOUS
  Administered 2012-04-23 (×2): 2 mg via INTRAVENOUS
  Administered 2012-04-23 (×3): 4 mg via INTRAVENOUS
  Filled 2012-04-22 (×2): qty 2
  Filled 2012-04-22: qty 1
  Filled 2012-04-22 (×2): qty 2
  Filled 2012-04-22: qty 1
  Filled 2012-04-22 (×2): qty 2

## 2012-04-22 MED ORDER — LEVALBUTEROL HCL 0.63 MG/3ML IN NEBU
0.6300 mg | INHALATION_SOLUTION | Freq: Two times a day (BID) | RESPIRATORY_TRACT | Status: DC
  Administered 2012-04-22 – 2012-04-23 (×4): 0.63 mg via RESPIRATORY_TRACT
  Filled 2012-04-22 (×5): qty 3

## 2012-04-22 MED ORDER — RISAQUAD PO CAPS
2.0000 | ORAL_CAPSULE | Freq: Every day | ORAL | Status: DC
  Administered 2012-04-22 – 2012-04-23 (×2): 2 via ORAL
  Filled 2012-04-22 (×2): qty 2

## 2012-04-22 MED ORDER — BISMUTH SUBSALICYLATE 262 MG PO CHEW
524.0000 mg | CHEWABLE_TABLET | Freq: Three times a day (TID) | ORAL | Status: DC
  Administered 2012-04-22 – 2012-04-23 (×5): 524 mg via ORAL
  Filled 2012-04-22 (×7): qty 2

## 2012-04-22 MED ORDER — OXYCODONE HCL 15 MG PO TB12
15.0000 mg | ORAL_TABLET | Freq: Two times a day (BID) | ORAL | Status: DC
  Administered 2012-04-23 (×2): 15 mg via ORAL
  Filled 2012-04-22 (×2): qty 1

## 2012-04-22 NOTE — Progress Notes (Signed)
Subjective: The patient was seen on rounds today.  The patient was sitting quietly in the reclining chair and watching television.  The patient rates his pain as an 8/10 today in his bilateral UEs, diffuse back pain (more pronounced on the lower right back area) and diffuse chest pain (non cardiac).  The patient states that Dilaudid was working better for his pain.  IV Morphine D/C'd and IV Dilaudid ordered.  The patient states that he still has a productive cough and it hurts to take a deep breath.  The patient states his GERD has lessened.  The patient states that he gets a "tightening" feeling in his lower abdomen/suprapubic area for a few seconds and then it disappears.  The patient received a PICC line yesterday and tolerated the procedure well.   No other nursing or patient concerns.   Objective: Vital signs in last 24 hours: Blood pressure 121/90, pulse 82, temperature 98.7 F (37.1 C), temperature source Oral, resp. rate 20, height 6\' 1"  (1.854 m), weight 157 lb (71.215 kg), SpO2 100.00%.  General Appearance: Alert, cooperative, well nourished, well developed, moderate distress Head: Normocephalic, without obvious abnormality, atraumatic Eyes: Conjunctivae clear, PERRLA, EOMI, scleral icterus Nose: Nares, septum and mucosa are normal, no visible drainage, no sinus tenderness. Throat: Lips, mucosa, tongue, teeth and gums are normal Neck: No adenopathy, supple, symmetrical, trachea midline, thyroid not enlarged, symmetric, no tenderness Back: Symmetric, impaired ROM, bilateral CVA tenderness, diffuse tenderness Resp: Scattered rhonchi throughout, diminished bibasilar breath sounds, no wheezes/rales Cardio: Regular rate and rhythm, S1, S2 normal, no murmur, click, rub or gallop GI: Soft, diffuse tenderness, hypoactive bowel sounds, no masses, no organomegaly Male Genitalia: Deferred, the patient denies priapism Extremities: Extremities normal, atraumatic, no cyanosis, no edema, Homans sign  is negative, no sign of DVT, tender bilateral UEs Pulses: 2+ and symmetric Skin: Skin color, texture, turgor are normal, no rashes or lesions Neurologic: Grossly normal, no focal deficits, CN II - XII intact Psych:  Appropriate affect  Lab Results: Results for orders placed during the hospital encounter of 04/20/12 (from the past 24 hour(s))  CBC     Status: Abnormal   Collection Time   04/22/12  4:00 AM      Component Value Range   WBC 8.5  4.0 - 10.5 (K/uL)   RBC 4.11 (*) 4.22 - 5.81 (MIL/uL)   Hemoglobin 10.8 (*) 13.0 - 17.0 (g/dL)   HCT 04.5 (*) 40.9 - 52.0 (%)   MCV 75.7 (*) 78.0 - 100.0 (fL)   MCH 26.3  26.0 - 34.0 (pg)   MCHC 34.7  30.0 - 36.0 (g/dL)   RDW 81.1  91.4 - 78.2 (%)   Platelets 118 (*) 150 - 400 (K/uL)  COMPREHENSIVE METABOLIC PANEL     Status: Abnormal   Collection Time   04/22/12  4:00 AM      Component Value Range   Sodium 136  135 - 145 (mEq/L)   Potassium 3.5  3.5 - 5.1 (mEq/L)   Chloride 100  96 - 112 (mEq/L)   CO2 25  19 - 32 (mEq/L)   Glucose, Bld 110 (*) 70 - 99 (mg/dL)   BUN 4 (*) 6 - 23 (mg/dL)   Creatinine, Ser 9.56  0.50 - 1.35 (mg/dL)   Calcium 8.7  8.4 - 21.3 (mg/dL)   Total Protein 6.6  6.0 - 8.3 (g/dL)   Albumin 3.1 (*) 3.5 - 5.2 (g/dL)   AST 15  0 - 37 (U/L)   ALT 12  0 - 53 (U/L)   Alkaline Phosphatase 108  39 - 117 (U/L)   Total Bilirubin 0.5  0.3 - 1.2 (mg/dL)   GFR calc non Af Amer >90  >90 (mL/min)   GFR calc Af Amer >90  >90 (mL/min)  MAGNESIUM     Status: Normal   Collection Time   04/22/12  4:00 AM      Component Value Range   Magnesium 1.9  1.5 - 2.5 (mg/dL)  PHOSPHORUS     Status: Normal   Collection Time   04/22/12  4:00 AM      Component Value Range   Phosphorus 3.4  2.3 - 4.6 (mg/dL)  PRO B NATRIURETIC PEPTIDE     Status: Abnormal   Collection Time   04/22/12  4:00 AM      Component Value Range   Pro B Natriuretic peptide (BNP) 605.1 (*) 0 - 125 (pg/mL)    Studies/Results: Dg Chest Port 1 View  04/21/2012   *RADIOLOGY REPORT*  Clinical Data: PICC line placement.  PORTABLE CHEST - 1 VIEW  Comparison: 04/20/2012.  Findings: Right-sided PICC line has been placed.  The tip is at the level of the mid to distal superior vena cava.  Heart size top normal.  Mild central pulmonary vessel prominence. No segmental infiltrate or pneumothorax.  IMPRESSION: PICC line tip mid to distal superior vena cava level.  Original Report Authenticated By: Fuller Canada, M.D.    Medications:  No Known Allergies   Current Facility-Administered Medications  Medication Dose Route Frequency Provider Last Rate Last Dose  . acetaminophen (TYLENOL) tablet 650 mg  650 mg Oral Q6H PRN Ripudeep Jenna Luo, MD       Or  . acetaminophen (TYLENOL) suppository 650 mg  650 mg Rectal Q6H PRN Ripudeep K Rai, MD      . acidophilus (RISAQUAD) capsule 2 capsule  2 capsule Oral Daily Keitha Butte, NP      . albumin human 25 % solution 25 g  25 g Intravenous Once Keitha Butte, NP   25 g at 04/22/12 1000  . azithromycin (ZITHROMAX) tablet 500 mg  500 mg Oral Q24H Keitha Butte, NP   500 mg at 04/21/12 1542  . bismuth subsalicylate (PEPTO BISMOL) chewable tablet 524 mg  524 mg Oral TID AC & HS Keitha Butte, NP      . cefTRIAXone (ROCEPHIN) 1 g in dextrose 5 % 50 mL IVPB  1 g Intravenous Q24H Keitha Butte, NP      . dextromethorphan-guaiFENesin (MUCINEX DM) 30-600 MG per 12 hr tablet 1 tablet  1 tablet Oral BID Keitha Butte, NP   1 tablet at 04/22/12 0957  . dextrose 5 % and 0.2 % NaCl with KCl 20 mEq infusion   Intravenous Continuous Keitha Butte, NP 20 mL/hr at 04/22/12 0834 20 mL/hr at 04/22/12 0834  . diphenhydrAMINE (BENADRYL) injection 12.5-25 mg  12.5-25 mg Intravenous Q4H PRN Keitha Butte, NP      . folic acid (FOLVITE) tablet 1 mg  1 mg Oral Daily Ripudeep K Rai, MD   1 mg at 04/21/12 1543  . gi cocktail (Maalox,Lidocaine,Donnatal)  30 mL Oral BID PRN Ripudeep Jenna Luo, MD      .  HYDROcodone-acetaminophen (NORCO) 5-325 MG per tablet 1-2 tablet  1-2 tablet Oral Q4H PRN Keitha Butte, NP   2 tablet at 04/21/12 1541  . HYDROmorphone (DILAUDID) injection 2-4 mg  2-4 mg Intravenous Q2H  PRN Keitha Butte, NP      . ketorolac (TORADOL) 15 MG/ML injection 30 mg  30 mg Intravenous Q8H Keitha Butte, NP      . levalbuterol (XOPENEX) nebulizer solution 0.63 mg  0.63 mg Nebulization BID Gwenyth Bender, MD   0.63 mg at 04/22/12 0801  . lidocaine (LIDODERM) 5 % 2 patch  2 patch Transdermal Q24H Keitha Butte, NP   2 patch at 04/21/12 1541  . ondansetron (ZOFRAN) injection 4 mg  4 mg Intravenous Q4H PRN Keitha Butte, NP       Or  . ondansetron (ZOFRAN) tablet 4-8 mg  4-8 mg Oral Q4H PRN Keitha Butte, NP      . oxyCODONE (OXYCONTIN) 12 hr tablet 15 mg  15 mg Oral Q12H Keitha Butte, NP      . pantoprazole (PROTONIX) EC tablet 40 mg  40 mg Oral Q1200 Keitha Butte, NP   40 mg at 04/22/12 1218  . pneumococcal 23 valent vaccine (PNU-IMMUNE) injection 0.5 mL  0.5 mL Intramuscular Tomorrow-1000 Ripudeep K Rai, MD   0.5 mL at 04/22/12 1214  . potassium chloride SA (K-DUR,KLOR-CON) CR tablet 40 mEq  40 mEq Oral Once Keitha Butte, NP   40 mEq at 04/21/12 1641  . potassium chloride SA (K-DUR,KLOR-CON) CR tablet 40 mEq  40 mEq Oral Once Keitha Butte, NP      . senna-docusate (Senokot-S) tablet 1 tablet  1 tablet Oral QHS Keitha Butte, NP   1 tablet at 04/21/12 2146  . sodium chloride 0.9 % injection 10-40 mL  10-40 mL Intracatheter PRN Gwenyth Bender, MD   10 mL at 04/22/12 0400  . sodium chloride 0.9 % injection 3 mL  3 mL Intravenous Q12H Ripudeep K Rai, MD      . DISCONTD: bismuth subsalicylate (PEPTO BISMOL) 262 MG/15ML suspension 30 mL  30 mL Oral TID AC & HS Gwenyth Bender, MD   30 mL at 04/22/12 1218  . DISCONTD: ketorolac (TORADOL) 15 MG/ML injection 15 mg  15 mg Intravenous Q8H Keitha Butte, NP   15 mg at 04/22/12 0559  .  DISCONTD: levalbuterol (XOPENEX) nebulizer solution 0.63 mg  0.63 mg Nebulization Q8H Keitha Butte, NP   0.63 mg at 04/21/12 2012  . DISCONTD: morphine 4 MG/ML injection 8-10 mg  8-10 mg Intravenous Q2H PRN Keitha Butte, NP   8 mg at 04/22/12 1344  . DISCONTD: oxyCODONE (OXYCONTIN) 12 hr tablet 10 mg  10 mg Oral Q12H Keitha Butte, NP      . DISCONTD: oxyCODONE (OXYCONTIN) 12 hr tablet 10 mg  10 mg Oral Q12H Gwenyth Bender, MD   10 mg at 04/22/12 0559    Assessment/Plan: Patient Active Problem List  Diagnoses  . Atypical chest pain  . Sickle cell crisis  . Hypokalemia  . Tobacco abuse  . Dehydration  . Nausea & vomiting  . GERD (gastroesophageal reflux disease)  . Bronchitis  . Elevated brain natriuretic peptide (BNP) level   Sickle Cell Crisis:  The patient will continue receiving pain/nausea/pruritis/bowel management, IV hydration, DVT/GI prophylaxis and home medications. CMP and CBC in the am.  Hemoglobinopathy and Ferritin are pending. Hypokalemia:  The patient received PO supplementation today and IVFs will contain KCL - Will continue to monitor Tobacco abuse:  The patient is receiving a nicotine patch daily GERD:  The patient is receiving GI prophylaxis daily in addition to GI cocktail  available PRN and Bismuth TID/AC Sickle Chest Syndrome/Bronchitis:  The patient is receiving Azithromycin antibiotic therapy and will be started on Rocephin today. The patient is also receiving scheduled nebulizer treatments BID and Mucinex DM BID Elevated BNP:  IVF's have been decreased and 2D Echo has been ordered, recheck ProBNP in the am  Discussed and agreed upon plan of care with the patient.   The plan of care will be adjusted based on the patient's clinical progress.   Larina Bras, NP-C 04/22/2012, 2:16 PM

## 2012-04-22 NOTE — Progress Notes (Signed)
  Echocardiogram 2D Echocardiogram has been performed.  James Hamilton L 04/22/2012, 3:12 PM

## 2012-04-22 NOTE — ED Provider Notes (Signed)
Medical screening examination/treatment/procedure(s) were performed by non-physician practitioner and as supervising physician I was immediately available for consultation/collaboration.   Loren Racer, MD 04/22/12 [phone]

## 2012-04-23 LAB — COMPREHENSIVE METABOLIC PANEL
ALT: 14 U/L (ref 0–53)
Alkaline Phosphatase: 100 U/L (ref 39–117)
CO2: 27 mEq/L (ref 19–32)
Chloride: 101 mEq/L (ref 96–112)
GFR calc Af Amer: >90 mL/min (ref >90)
GFR calc non Af Amer: >90 mL/min (ref >90)
Glucose, Bld: 103 mg/dL — ABNORMAL HIGH (ref 70–99)
Potassium: 4.1 mEq/L (ref 3.5–5.1)
Sodium: 135 mEq/L (ref 135–145)

## 2012-04-23 LAB — CBC
Hemoglobin: 10.7 g/dL — ABNORMAL LOW (ref 13.0–17.0)
MCH: 26.4 pg (ref 26.0–34.0)
RBC: 4.05 MIL/uL — ABNORMAL LOW (ref 4.22–5.81)

## 2012-04-23 MED ORDER — BISMUTH SUBSALICYLATE 262 MG PO CHEW: 524.0000 mg | CHEWABLE_TABLET | Freq: Three times a day (TID) | ORAL | Status: AC

## 2012-04-23 MED ORDER — OXYCODONE-ACETAMINOPHEN 5-325 MG PO TABS: 1.0000 | ORAL_TABLET | ORAL | Status: AC | PRN

## 2012-04-23 MED ORDER — POTASSIUM CHLORIDE CRYS ER 10 MEQ PO TBCR
10.0000 meq | EXTENDED_RELEASE_TABLET | Freq: Every day | ORAL | Status: DC
  Administered 2012-04-23: 10 meq via ORAL
  Filled 2012-04-23: qty 1

## 2012-04-23 MED ORDER — NICOTINE 14 MG/24HR TD PT24
14.0000 mg | MEDICATED_PATCH | Freq: Every day | TRANSDERMAL | Status: DC
  Administered 2012-04-23: 14 mg via TRANSDERMAL
  Filled 2012-04-23: qty 1

## 2012-04-23 MED ORDER — SENNOSIDES-DOCUSATE SODIUM 8.6-50 MG PO TABS: 1.0000 | ORAL_TABLET | Freq: Every day | ORAL | Status: AC

## 2012-04-23 MED ORDER — PANTOPRAZOLE SODIUM 40 MG PO TBEC: 40.0000 mg | DELAYED_RELEASE_TABLET | Freq: Every day | ORAL | Status: DC

## 2012-04-23 MED ORDER — HYDROMORPHONE HCL PF 2 MG/ML IJ SOLN
2.0000 mg | INTRAMUSCULAR | Status: DC | PRN
  Administered 2012-04-23: 4 mg via INTRAVENOUS
  Filled 2012-04-23: qty 2

## 2012-04-23 MED ORDER — OXYCODONE HCL 15 MG PO TB12: 15.0000 mg | ORAL_TABLET | Freq: Two times a day (BID) | ORAL | Status: DC

## 2012-04-23 MED ORDER — AZITHROMYCIN 500 MG PO TABS: 500.0000 mg | ORAL_TABLET | ORAL | Status: AC

## 2012-04-23 NOTE — Discharge Summary (Signed)
[facility] Discharge Summary  Patient ID: James Hamilton MRN: [id number] DOB/AGE: 1986/02/22 25 y.o.  Admit date: 02/29/2012 Discharge date:  02/29/2012  Admission Diagnoses: sickle cell crisis  Discharge Diagnoses:  Sickle cell crisis improved.  Hemoglobin as/beta thalassemia  Discharged Condition:  Improved  Clinic Course:  Patient treated intensively with IV fluids, IV Dilaudid for pain control. Patient was given parenteral medications for nausea and pruritus as well as needed. He was continued on incentive spirometry. Over the subsequent hours the patient's pain improved considerably. His pain at the time of discharge was below 6/10. He felt that he can manage his pain with appropriate medications. Patient was encouraged to arrange for followup in the office for ongoing medical care.  Consults: None  Significant Diagnostic Studies:  No new laboratory data except as previously noted.   Discharge vital signs:  Blood pressure 121/63, pulse 71, temperature 97.5 F (36.4 C), temperature source Oral, resp. rate 20, SpO2 98.00%.   Disposition: 01-Home or Self Care  Discharge Orders    Future Orders Please Complete By Expires   Diet - low sodium heart healthy      Increase activity slowly      No wound care      Call MD for:  severe uncontrolled pain      Discharge instructions      Comments:   Follow up with primary care physician in one week.     MEDICATIONS:  Folic acid 1 mg daily.  Percocet 5/325 mg every 4 hours when necessary pain.  OxyContin 15 mg twice a day. Protonix 40 mg daily    Signed: Willey Blade 02/29/2012  8:32 PM

## 2012-04-23 NOTE — Progress Notes (Signed)
Clinical Social Work Department BRIEF PSYCHOSOCIAL ASSESSMENT 04/23/2012  Patient:  James Hamilton, James Hamilton     Account Number:  [id number]     Admit date:  04/20/2012  Clinical Social Worker:  Eddie Candle  Date/Time:  04/23/2012 10:30 AM  Referred by:  Physician  Date Referred:  04/23/2012 Referred for  Other - See comment   Other Referral:   medical insurance and outpatient care   Interview type:  Patient Other interview type:    PSYCHOSOCIAL DATA Living Status:  SIGNIFICANT OTHER Admitted from facility:   Level of care:   Primary support name:  mother Primary support relationship to patient:  PARENT Degree of support available:   good    CURRENT CONCERNS Current Concerns  Financial Resources   Other Concerns:    SOCIAL WORK ASSESSMENT / PLAN Patient identified that he does not have frequent crises but has had them of late.  Patient states that at this time he does not have insurance and is looking for work so has not maintained appropriate medications for [place] needs.   Assessment/plan status:  Referral to [facility] Other assessment/ plan:   Information/referral to community resources:   CSW contacted [facility] to Engineer, agricultural.    PATIENT'S/FAMILY'S RESPONSE TO PLAN OF CARE: CSW left card for patient follow up.  Patient agreeable with plan.

## 2012-04-23 NOTE — Discharge Summary (Signed)
Discharge Summary  James Hamilton MR#: [id number]  DOB:06/24/1986  Date of Admission: 04/20/2012 Date of Discharge: 04/23/2012  Patient's PCP: No primary provider on file.  Attending Physician:EDWARDS, MICHELLE PAULETTE  Consults:  Dr. August Saucer  Discharge Diagnoses: Principal Problem:  *Atypical chest pain Active Problems:  Sickle cell crisis  Hypokalemia  Tobacco abuse  Dehydration  Nausea & vomiting  GERD (gastroesophageal reflux disease)  Bronchitis  Elevated brain natriuretic peptide (BNP) level   Brief Admitting History and Physical:HPI:  Patient is a 26 year old male with past medical history of sickle cell disease and GERD presented to [facility] ED with complaints of epigastric pain radiating to chest and back pain.   Patient is accompanied with his mother who also stated that he had difficult time at night due to pain which was not controlled with Percocet. Patient also complains of epigastric pain and chest pain which feels as 'heartburn'. Denies any acute shortness of breath, palpitations, any radiation of the chest pain. He rates the chest and back pain is 6/10 in intensity, constant and sharp. Patient also reports nausea with one episode of vomiting Chest x-ray  Negative.  General Appearance: Alert, cooperative, well nourished, well developed, no acute distress  Head: Normocephalic, , atraumatic  Eyes: Conjunctivae clear, PERRLA, EOMI, scleral icterus  Nose: Nares, septum and mucosa are normal, no visible drainage, no sinus tenderness.  Throat: Lips, mucosa, tongue, teeth and gums are normal  Neck: No adenopathy, supple, symmetrical, trachea midline, thyroid not enlarged, symmetric, no tenderness  Back: Symmetric, bilateral CVA tenderness, diffuse tenderness  Resp: Scattered rhonchi throughout, diminished bibasilar breath sounds, no wheezes/rales  Cardio: Regular rate and rhythm, S1, S2 normal, no murmur, click, rub or gallop  GI: Soft, diffuse tenderness,  hypoactive bowel sounds, no masses, no organomegaly  Male Genitalia: Deferred, denies priapism  Extremities: Extremities normal, atraumatic, no cyanosis, no edema, Homans sign is negative, n tender bilateral UEs  Pulses: 2+ and symmetric  Skin: Skin color, texture, turgor are normal, no rashes or lesions numerous tattoos  Neurologic: Grossly normal, no focal deficits, CN II - XII intact  Psych: Appropriate affect     Discharge Medications Medication List  As of 04/23/2012  6:16 PM   ASK your doctor about these medications         aspirin-acetaminophen-caffeine 250-250-65 MG per tablet   Commonly known as: EXCEDRIN MIGRAINE   Take 1 tablet by mouth every 6 (six) hours as needed. PAIN      folic acid 1 MG tablet   Commonly known as: FOLVITE   Take 1 tablet (1 mg total) by mouth daily.      oxyCODONE-acetaminophen 5-325 MG per tablet   Commonly known as: PERCOCET   Take 1 tablet by mouth every 4 (four) hours as needed for pain.            Hospital Course: Atypical chest pain Present on Admission:  .Atypical chest pain .Sickle cell crisis .Hypokalemia .Tobacco abuse .Dehydration .Nausea & vomiting .GERD (gastroesophageal reflux disease)    Day of Discharge BP 122/73  Pulse 65  Temp 97.3 F (36.3 C) (Oral)  Resp 18  Ht 6\' 1"  (1.854 m)  Wt 71.215 kg (157 lb)  BMI 20.71 kg/m2  SpO2 100%  Results for orders placed during the hospital encounter of 04/20/12 (from the past 48 hour(s))  CBC     Status: Abnormal   Collection Time   04/22/12  4:00 AM      Component Value Range Comment  WBC 8.5  4.0 - 10.5 K/uL    RBC 4.11 (*) 4.22 - 5.81 MIL/uL    Hemoglobin 10.8 (*) 13.0 - 17.0 g/dL    HCT 09.8 (*) 11.9 - 52.0 %    MCV 75.7 (*) 78.0 - 100.0 fL    MCH 26.3  26.0 - 34.0 pg    MCHC 34.7  30.0 - 36.0 g/dL    RDW 14.7  82.9 - 56.2 %    Platelets 118 (*) 150 - 400 K/uL   COMPREHENSIVE METABOLIC PANEL     Status: Abnormal   Collection Time   04/22/12  4:00 AM       Component Value Range Comment   Sodium 136  135 - 145 mEq/L    Potassium 3.5  3.5 - 5.1 mEq/L    Chloride 100  96 - 112 mEq/L    CO2 25  19 - 32 mEq/L    Glucose, Bld 110 (*) 70 - 99 mg/dL    BUN 4 (*) 6 - 23 mg/dL    Creatinine, Ser 1.30  0.50 - 1.35 mg/dL    Calcium 8.7  8.4 - 86.5 mg/dL    Total Protein 6.6  6.0 - 8.3 g/dL    Albumin 3.1 (*) 3.5 - 5.2 g/dL    AST 15  0 - 37 U/L    ALT 12  0 - 53 U/L    Alkaline Phosphatase 108  39 - 117 U/L    Total Bilirubin 0.5  0.3 - 1.2 mg/dL    GFR calc non Af Amer >90  >90 mL/min    GFR calc Af Amer >90  >90 mL/min   MAGNESIUM     Status: Normal   Collection Time   04/22/12  4:00 AM      Component Value Range Comment   Magnesium 1.9  1.5 - 2.5 mg/dL   PHOSPHORUS     Status: Normal   Collection Time   04/22/12  4:00 AM      Component Value Range Comment   Phosphorus 3.4  2.3 - 4.6 mg/dL   FERRITIN     Status: Abnormal   Collection Time   04/22/12  4:00 AM      Component Value Range Comment   Ferritin 2144 (*) 22 - 322 ng/mL Result confirmed by automatic dilution.  PRO B NATRIURETIC PEPTIDE     Status: Abnormal   Collection Time   04/22/12  4:00 AM      Component Value Range Comment   Pro B Natriuretic peptide (BNP) 605.1 (*) 0 - 125 pg/mL   PRO B NATRIURETIC PEPTIDE     Status: Abnormal   Collection Time   04/23/12  6:10 AM      Component Value Range Comment   Pro B Natriuretic peptide (BNP) 194.2 (*) 0 - 125 pg/mL   CBC     Status: Abnormal   Collection Time   04/23/12  6:20 AM      Component Value Range Comment   WBC 5.3  4.0 - 10.5 K/uL    RBC 4.05 (*) 4.22 - 5.81 MIL/uL    Hemoglobin 10.7 (*) 13.0 - 17.0 g/dL    HCT 78.4 (*) 69.6 - 52.0 %    MCV 75.8 (*) 78.0 - 100.0 fL    MCH 26.4  26.0 - 34.0 pg    MCHC 34.9  30.0 - 36.0 g/dL    RDW 29.5  28.4 - 13.2 %    Platelets 140 (*)  150 - 400 K/uL   COMPREHENSIVE METABOLIC PANEL     Status: Abnormal   Collection Time   04/23/12  6:20 AM      Component Value Range Comment    Sodium 135  135 - 145 mEq/L    Potassium 4.1  3.5 - 5.1 mEq/L    Chloride 101  96 - 112 mEq/L    CO2 27  19 - 32 mEq/L    Glucose, Bld 103 (*) 70 - 99 mg/dL    BUN 7  6 - 23 mg/dL    Creatinine, Ser 4.09  0.50 - 1.35 mg/dL    Calcium 8.7  8.4 - 81.1 mg/dL    Total Protein 6.8  6.0 - 8.3 g/dL    Albumin 3.2 (*) 3.5 - 5.2 g/dL    AST 16  0 - 37 U/L    ALT 14  0 - 53 U/L    Alkaline Phosphatase 100  39 - 117 U/L    Total Bilirubin 0.5  0.3 - 1.2 mg/dL    GFR calc non Af Amer >90  >90 mL/min    GFR calc Af Amer >90  >90 mL/min     Dg Chest 2 View  04/20/2012  *RADIOLOGY REPORT*  Clinical Data: Back pain, sickle cell crisis  CHEST - 2 VIEW  Comparison: 04/19/12  Findings: Cardiomediastinal silhouette is stable.  No acute infiltrate or pulmonary edema.  Stable H shaped vertebra mid thoracic spine.  IMPRESSION: No active disease.  No significant change.  Original Report Authenticated By: Natasha Mead, M.D.   Dg Chest 2 View  04/19/2012  *RADIOLOGY REPORT*  Clinical Data: Sickle cell crisis.  Mid chest pain.  CHEST - 2 VIEW  Comparison: Two-view chest 06/15/2011.  Findings: The heart size is normal.  The lungs are clear.  The visualized soft tissues and bony thorax are unremarkable.  IMPRESSION: Negative chest.  Original Report Authenticated By: Jamesetta Orleans. MATTERN, M.D.   Dg Chest Port 1 View  04/21/2012  *RADIOLOGY REPORT*  Clinical Data: PICC line placement.  PORTABLE CHEST - 1 VIEW  Comparison: 04/20/2012.  Findings: Right-sided PICC line has been placed.  The tip is at the level of the mid to distal superior vena cava.  Heart size top normal.  Mild central pulmonary vessel prominence. No segmental infiltrate or pneumothorax.  IMPRESSION: PICC line tip mid to distal superior vena cava level.  Original Report Authenticated By: Fuller Canada, M.D.     Disposition: Home   Diet: Regular  Activity: Up as tolerated    Follow-up Appts: [facility] for follow up appointment for 1  week [phone]  TESTS THAT NEED FOLLOW-UP   Discharge plan changed . Patient became light headed when ambulated. Patient will be monitored further.   9:57 PM: Patient ambulated this afternoon. Feeling much better. Rating his pain at 3/10. He will need to rest over the next several days. Patient is in agreement. He will call[facility] if he experiences any relapsing pain. Patient discharged.   SignedSylvester Harder 04/23/2012, 6:16 PM

## 2012-04-23 NOTE — H&P (Signed)
Patient ID: James Hamilton, male   DOB: 08/27/1986, 26 y.o.   MRN: [id number]  Chief Complaint  Patient presents with  . Sickle Cell Pain Crisis  . Back Pain    HPI James Hamilton is a 26 y.o. male  with sickle cell disease who comes to the clinic complaining of ongoing lower back and limb pain. He rates his pain as an 8/10. He's been treating at home without significant relief. Patient denies chest pains cough fever chills or night sweats. No other new complaints. Patient has hemoglobin S./beta thalassemia.   Past Medical History  Diagnosis Date  . No pertinent past medical history     No past surgical history on file.  No family history on file.  Social History History  Substance Use Topics  . Smoking status: Current Everyday Smoker -- 0.5 packs/day  . Smokeless tobacco: Not on file  . Alcohol Use: Yes     MEDICATIONS: OxyContin 15 mg twice a day Percocet 5/325 mg every 4 hours when necessary Protonic 40 mg by mouth daily Senokot-S one tablet each bedtime  No Known Allergies    Review of Systems As noted above. Blood pressure 121/63, pulse 71, temperature 97.5 F (36.4 C), temperature source Oral, resp. rate 20, SpO2 98.00%.  Physical Exam Well-developed nourished black male in mild distress. HEENT: Head normocephalic atraumatic without bruits. No sclera icterus. TMs are clear. NECK: No enlarged thyroid. No posterior cervical nodes. LUNGS: Clear to auscultation. No vocal fremitus. No CVA tenderness. CV: Normal S1, S2 without S3. No murmurs or rubs. ABDOMEN: No masses or tenderness. MSK: Tenderness in the lower lumbar sacral spine. Negative Homans. No edema. No tenderness in knees or ankles. NEURO: Intact.  Data Reviewed  CBC, CMET unremarkable. No evidence for active hemolysis.  Assessment    Sickle cell crisis. Uncontrolled pain. Hemoglobin S/beta thalassemia.    Plan    Start patient IV fluids with half-normal saline at 125 cc an  hour. Dilaudid 2-4 mg IV every 2 hours when necessary pain. Incentive spirometry. Reassess patient for response.       Erhardt Dada 02/29/2012, 9:10 AM

## 2012-04-23 NOTE — Discharge Instructions (Addendum)
Foods Rich in Potassium The body needs potassium to:  Control blood pressure.   Keep the muscles healthy.   Keep the nervous system healthy.  Most foods contain potassium. Eating a variety of foods in the right amounts will help control the level of potassium in your body.  Food / Potassium (mg)  Apricots, dried,  cup / 378 mg   Apricots, raw, 1 cup halves / 401 mg   Avocado,  / 487 mg   Banana, 1 large / 487 mg   Beef, lean, round, 3 oz / 202 mg   Cantaloupe, 1 cup cubes / 427 mg   Dates, medjool, 5 whole / 835 mg   Ham, cured, 3 oz / 212 mg   Lentils, dried,  cup / 458 mg   Lima beans, frozen,  cup / 258 mg   Orange, 1 large / 333 mg   Orange juice, 1 cup / 443 mg   Peaches, dried,  cup / 398 mg   Peas, split, cooked,  cup / 355 mg   Potato, boiled, 1 medium / 515 mg   Prunes, dried, uncooked,  cup / 318 mg   Raisins,  cup / 309 mg   Salmon, pink, raw, 3 oz / 275 mg   Sardines, canned , 3 oz / 338 mg   Tomato, raw, 1 medium / 292 mg   Tomato juice, 6 oz / 417 mg   [place], 3 oz / 349 mg  Other Foods High in Potassium (greater than 250 mg):  Bran cereals and other bran products.   Milk (skim, 1%, 2%, whole).   Buttermilk.   Yogurt.   Nuts.   Dried fruits.   Cherries.   Sweet potatoes.   Oranges.   Baked Beans.   Broccoli.   Spinach.   Peanut butter.   Tofu.  Foods Lower in Potassium (less than 250 mg):  Pasta.   Rice.   Cottage cheese.   Cheddar cheese.   Apples.   Mango.   Grapes.   Grapefruit.   Pineapple.   Raspberries.   Strawberries.   Watermelon.   Green Beans.   Cabbage.   Carrots.   Cauliflower.   Celery.   Corn.    Mushrooms.   Onions.   Squash.   Eggs.  The list below tells you how big or small some common portion sizes are:  1 oz.........4 stacked dice.   3 oz........Marland KitchenDeck of cards.   1 tsp.......Marland KitchenTip of little finger.   1 tbs......Marland KitchenMarland KitchenThumb.   2 tbs.......Marland KitchenGolf  ball.    cup......Marland KitchenHalf of a fist.   1 cup.......Marland KitchenA fist.  Document Released: 04/16/2008 Document Revised: 07/11/2011 Document Reviewed: 03/14/2009 [facility] Patient Information 2012 [place], [place].Hypokalemia Hypokalemia means a low potassium level in the blood. Symptoms may include muscle weakness and cramping, fatigue, abdominal pain, vomiting, constipation, or irregularities of the heartbeat. Sometimes hypokalemia is discovered by your caregiver if you are taking certain medicines for high blood pressure or kidney disease.  Potassium is an electrolyte that helps regulate the amount of fluid in the body. It also stimulates muscle contraction and maintains a stable acid-base balance. If potassium levels go too low or too high, your health may be in danger. You are at risk for developing shock, heart, and lung problems. Hypokalemia can occur if you have excessive diarrhea, vomiting, or sweating. Potassium can be lost through your kidneys in the urine. Certain common medicines can also cause potassium loss, especially water pills (diuretics). The same is  possible with cortisone medications or certain types of antibiotics. Low potassium can be dangerous if you are taking certain heart medicines. In diabetes, your potassium may fall after you take insulin, especially if your diabetes had been out of control for a while. In rare cases, potassium may be low because you are not getting enough in your diet.  In adults, a potassium level below 3.5 mEq/L is usually considered low. Hypokalemia can be treated with potassium supplements taken by mouth and a diet that is high in potassium. Foods with high potassium content are:  Peas, lentils, lima beans, nuts, and dried fruit.   Whole grain and bran cereals and breads.   Fresh fruit and vegetables. Examples include:   Bananas.   Cantaloupe.   Grapefruit.   Oranges.   Tomatoes.   Honeydew melons.   Potatoes.   Peaches.   Orange and tomato  juices.   Meats.  See your caregiver as instructed for a follow-up blood test to be sure your potassium is back to normal. SEEK MEDICAL CARE IF:   You have nausea, vomiting, constipation, or abdominal pain.   You have palpitations or irregular heartbeats, chest pain or shortness of breath.   You have muscle cramps or weakness or fatigue.   You have lethargy.  SEEK IMMEDIATE MEDICAL CARE IF:   You have paralysis.   You have confusion or other mental status changes.  Document Released: 10/29/2005 Document Revised: 10/18/2011 Document Reviewed: 02/22/2010 [facility] Patient Information 2012 [place], [place].

## 2012-04-24 LAB — HEMOGLOBINOPATHY EVALUATION
Hemoglobin Other: 0 %
Hgb A: 20.8 % — ABNORMAL LOW (ref 96.8–97.8)

## 2013-03-27 ENCOUNTER — Encounter: Payer: Self-pay | Admitting: Internal Medicine

## 2013-05-03 ENCOUNTER — Emergency Department (HOSPITAL_BASED_OUTPATIENT_CLINIC_OR_DEPARTMENT_OTHER): Payer: Self-pay

## 2013-05-03 ENCOUNTER — Emergency Department (HOSPITAL_BASED_OUTPATIENT_CLINIC_OR_DEPARTMENT_OTHER)
Admission: EM | Admit: 2013-05-03 | Discharge: 2013-05-03 | Disposition: A | Discharge status: Home / Self Care | Payer: Self-pay | Attending: Emergency Medicine | Admitting: Emergency Medicine

## 2013-05-03 ENCOUNTER — Encounter (HOSPITAL_BASED_OUTPATIENT_CLINIC_OR_DEPARTMENT_OTHER): Payer: Self-pay | Admitting: Emergency Medicine

## 2013-05-03 DIAGNOSIS — F17200 Nicotine dependence, unspecified, uncomplicated: Secondary | ICD-10-CM | POA: Insufficient documentation

## 2013-05-03 DIAGNOSIS — M79609 Pain in unspecified limb: Secondary | ICD-10-CM | POA: Insufficient documentation

## 2013-05-03 DIAGNOSIS — Z79899 Other long term (current) drug therapy: Secondary | ICD-10-CM | POA: Insufficient documentation

## 2013-05-03 DIAGNOSIS — D5700 Hb-SS disease with crisis, unspecified: Secondary | ICD-10-CM

## 2013-05-03 LAB — CBC WITH DIFFERENTIAL/PLATELET
Eosinophils Absolute: 0.4 10*3/uL (ref 0.0–0.7)
HCT: 36.8 % — ABNORMAL LOW (ref 39.0–52.0)
Hemoglobin: 13.4 g/dL (ref 13.0–17.0)
Lymphs Abs: 2.5 10*3/uL (ref 0.7–4.0)
MCH: 26.5 pg (ref 26.0–34.0)
Monocytes Absolute: 0.7 10*3/uL (ref 0.1–1.0)
Monocytes Relative: 6 % (ref 3–12)
Neutrophils Relative %: 66 % (ref 43–77)
RBC: 5.06 MIL/uL (ref 4.22–5.81)

## 2013-05-03 LAB — BASIC METABOLIC PANEL
BUN: 7 mg/dL (ref 6–23)
CO2: 24 mEq/L (ref 19–32)
Glucose, Bld: 102 mg/dL — ABNORMAL HIGH (ref 70–99)
Potassium: 4.1 mEq/L (ref 3.5–5.1)
Sodium: 139 mEq/L (ref 135–145)

## 2013-05-03 LAB — RETICULOCYTES
RBC.: 5.06 MIL/uL (ref 4.22–5.81)
Retic Count, Absolute: 106.3 10*3/uL (ref 19.0–186.0)

## 2013-05-03 MED ORDER — FENTANYL CITRATE 0.05 MG/ML IJ SOLN: 100.0000 ug | Freq: Once | INTRAMUSCULAR | Status: DC

## 2013-05-03 MED ORDER — FENTANYL CITRATE 0.05 MG/ML IJ SOLN
100.0000 ug | INTRAMUSCULAR | Status: DC | PRN
  Administered 2013-05-03 (×2): 100 ug via INTRAVENOUS
  Filled 2013-05-03 (×2): qty 2

## 2013-05-03 MED ORDER — OXYCODONE-ACETAMINOPHEN 5-325 MG PO TABS: ORAL_TABLET | ORAL | Status: DC

## 2013-05-03 MED ORDER — ONDANSETRON HCL 4 MG/2ML IJ SOLN: 4.0000 mg | Freq: Once | INTRAMUSCULAR | Status: DC | PRN

## 2013-05-03 MED ORDER — SODIUM CHLORIDE 0.9 % IV BOLUS (SEPSIS)
1000.0000 mL | Freq: Once | INTRAVENOUS | Status: AC
  Administered 2013-05-03: 1000 mL via INTRAVENOUS

## 2013-05-03 MED ORDER — FOLIC ACID 1 MG PO TABS: 1.0000 mg | ORAL_TABLET | Freq: Every day | ORAL | Status: DC

## 2013-05-03 NOTE — Discharge Instructions (Signed)
Sickle Cell Anemia  Sickle cell anemia needs regular medical care by your caregiver and awareness by you when to seek medical care. Pain is a common problem in children with sickle cell disease. This usually starts at less than 26 year of age. Pain can occur nearly anywhere in the body, but most commonly happens in the extremities, back, chest, or belly (abdomen). Pain episodes can start suddenly or may follow an illness. These attacks can appear as decreased activity, loss of appetite, change in behavior, or simply complaints of pain.  DIAGNOSIS    Specialized blood and gene testing can help make this diagnosis early in the disease. Blood tests may then be done to watch blood levels.   Specialized brain scans are done when there are problems in the brain during a crisis.   Lung testing may be done later in the disease.  HOME CARE INSTRUCTIONS    Maintain good hydration. Increase your child's fluid intake in hot weather and during exercise.   Avoid smoking around your child. Smoking lowers the oxygen in the blood and can cause sickling.   Control pain. Only give your child over-the-counter or prescription medicines for pain, discomfort, or fever as directed by their caregiver. Do not give aspirin to children because of the association with Reye's syndrome.   Keep regular health care checks to keep a proper red blood cell (hemoglobin) level. A moderate anemia level protects against sickling crises.   You or your child should receive all the same immunizations and care as the people around them.   Moms should breastfeed their babies if possible. Use formulas with iron added if breastfeeding is not possible. Additional iron should not be given unless there is a lack of it. People with SCD build up iron faster than normal. Give folic acid and additional vitamins as directed.   If you or your child has been prescribed antibiotics or other medications to prevent problems, give them as directed.   Summer camps  are available for children with SCD. They may help young people deal with their disease. The camps introduce them to other children with the same condition.   Young people with SCD may become frustrated or angry at their disease. This can cause rebellion and refusal to follow medical care. Help groups or counseling may help with these problems.   Make sure your child wears a medical alert bracelet. When traveling, keep your medical information, caregiver's names, and the medications your child takes with you at all times.  SEEK IMMEDIATE MEDICAL CARE IF:    You or your child feels dizzy or faint.   You or your child develops a new onset of abdominal pain, especially on the left side near the stomach area.   You or your child develops a persistent, often uncomfortable and painful penile erection. This is called priapism. Always check young boys for this. It is often embarrassing for them and they may not bring it to your attention. This is a medical emergency and needs immediate treatment. If this is not treated it will lead to impotence.   You or your child develops numbness in or has a hard time moving arms and legs.   You or your child has a hard time with speech.   You have a fever.   You or your child develops signs of infection (chills, lethargy, irritability, poor eating, vomiting). The younger the child, the more you should be concerned.   With fevers, do not give medications to lower   the fever right away. This could cover up a problem that is developing. Notify your caregiver.   You or your child develops pain that is not helped with medicine.   You or your child develops shortness of breath, or is coughing up pus-like or bloody sputum.   You or your child develops any problems that are new and are causing you to worry.  Document Released: 02/06/2006 Document Revised: 01/21/2012 Document Reviewed: 03/29/2010  [employer] Patient Information 2014 [employer].

## 2013-05-03 NOTE — ED Notes (Signed)
Report given to Waneta Martins RN and introduction to pt done and Brett Canales now assumes care at this time.

## 2013-05-03 NOTE — ED Notes (Signed)
Pt reports arm and leg pain that has been going on x1 month but increased since Friday past describes as throbbing aching sensation in extremities

## 2013-05-03 NOTE — ED Provider Notes (Addendum)
Pt's pain down to 5/10, improved, will gie second dose.  Pt is ok going home after that.  Hgb is up at 13, hip films are normal.  Since rare pain crises, will not need hydroxyurea in my opinion, but pt recalls has been on folate in the past, will give Rx for that.    Gavin Pound. Hawken Bielby, MD 05/03/13 0913   9:46 AM Notified by community pharmacy that pt and family elect to only fill percocet prescription, not folic acid.  Percocet cost $16, folic acid $12.  Family cited cost being the problem.    Gavin Pound. Oletta Lamas, MD 05/03/13 [phone]

## 2013-05-03 NOTE — ED Provider Notes (Signed)
History     CSN: [id number]  Arrival date & time 05/03/13  4098   First MD Initiated Contact with Patient 05/03/13 [phone]      Chief Complaint  Patient presents with  . Sickle Cell Pain Crisis    (Consider location/radiation/quality/duration/timing/severity/associated sxs/prior treatment) HPI This is a 27 year old black male with sickle cell disease. He states he has not had a sickle cell crisis is over a year. He complains of pain in his upper arms which he describes as throbbing and moderate to severe. The pain is consistent with prior episodes of sickle cell crisis. He also complains of pain in his right lateral thighs for the last month. He denies injury. He is not on any home medications and has not taken anything for the pain. He denies chest pain, shortness of breath, fever, chills, nausea, vomiting, diarrhea, constipation or dysuria.  Past Medical History  Diagnosis Date  . Sickle cell disease     History reviewed. No pertinent past surgical history.  History reviewed. No pertinent family history.  History  Substance Use Topics  . Smoking status: Current Every Day Smoker -- 0.50 packs/day  . Smokeless tobacco: Not on file  . Alcohol Use: Yes      Review of Systems  All other systems reviewed and are negative.    Allergies  Review of patient's allergies indicates no known allergies.  Home Medications   Current Outpatient Rx  Name  Route  Sig  Dispense  Refill  . aspirin-acetaminophen-caffeine (EXCEDRIN MIGRAINE) 250-250-65 MG per tablet   Oral   Take 1 tablet by mouth every 6 (six) hours as needed. PAIN         . oxyCODONE (OXYCONTIN) 15 MG TB12   Oral   Take 1 tablet (15 mg total) by mouth every 12 (twelve) hours.   30 tablet   0   . EXPIRED: pantoprazole (PROTONIX) 40 MG tablet   Oral   Take 1 tablet (40 mg total) by mouth daily at 12 noon.   30 tablet   1     BP 133/85  Pulse 75  Temp(Src) 98.1 F (36.7 C) (Oral)  Ht 6\' 2"  (1.88 m)  Wt  170 lb (77.111 kg)  BMI 21.82 kg/m2  SpO2 97%   Physical Exam General: Well-developed, well-nourished male in no acute distress; appearance consistent with age of record HENT: normocephalic, atraumatic Eyes: pupils equal round and reactive to light; extraocular muscles intact; no scleral icterus Neck: supple Heart: regular rate and rhythm; no murmurs, rubs or gallops Lungs: clear to auscultation bilaterally Abdomen: soft; nondistended; nontender; no masses or hepatosplenomegaly; bowel sounds present Extremities: No deformity; full range of motion; pulses normal; mild tenderness of right lateral thigh Neurologic: Awake, alert and oriented; motor function intact in all extremities and symmetric; no facial droop Skin: Warm and dry Psychiatric: Normal mood and affect    ED Course  Procedures (including critical care time)     MDM  Dr. Oletta Lamas will follow-up and make disposition.         Hanley Seamen, MD 05/03/13 [phone]

## 2013-05-06 ENCOUNTER — Encounter (HOSPITAL_BASED_OUTPATIENT_CLINIC_OR_DEPARTMENT_OTHER): Payer: Self-pay | Admitting: *Deleted

## 2013-05-06 ENCOUNTER — Emergency Department (HOSPITAL_BASED_OUTPATIENT_CLINIC_OR_DEPARTMENT_OTHER)
Admission: EM | Admit: 2013-05-06 | Discharge: 2013-05-06 | Disposition: A | Discharge status: Home / Self Care | Payer: Self-pay | Attending: Emergency Medicine | Admitting: Emergency Medicine

## 2013-05-06 DIAGNOSIS — M79609 Pain in unspecified limb: Secondary | ICD-10-CM | POA: Insufficient documentation

## 2013-05-06 DIAGNOSIS — D5700 Hb-SS disease with crisis, unspecified: Secondary | ICD-10-CM | POA: Insufficient documentation

## 2013-05-06 DIAGNOSIS — M25559 Pain in unspecified hip: Secondary | ICD-10-CM | POA: Insufficient documentation

## 2013-05-06 DIAGNOSIS — F17200 Nicotine dependence, unspecified, uncomplicated: Secondary | ICD-10-CM | POA: Insufficient documentation

## 2013-05-06 LAB — RETICULOCYTES
RBC.: 5.21 MIL/uL (ref 4.22–5.81)
Retic Count, Absolute: 88.6 10*3/uL (ref 19.0–186.0)
Retic Ct Pct: 1.7 % (ref 0.4–3.1)

## 2013-05-06 LAB — COMPREHENSIVE METABOLIC PANEL
ALT: 22 U/L (ref 0–53)
Albumin: 3.8 g/dL (ref 3.5–5.2)
Alkaline Phosphatase: 116 U/L (ref 39–117)
BUN: 12 mg/dL (ref 6–23)
Potassium: 4.4 mEq/L (ref 3.5–5.1)
Sodium: 138 mEq/L (ref 135–145)
Total Protein: 7.7 g/dL (ref 6.0–8.3)

## 2013-05-06 LAB — CBC WITH DIFFERENTIAL/PLATELET
Basophils Relative: 0 % (ref 0–1)
Eosinophils Relative: 3 % (ref 0–5)
Hemoglobin: 14.1 g/dL (ref 13.0–17.0)
Lymphocytes Relative: 16 % (ref 12–46)
Monocytes Relative: 7 % (ref 3–12)
Neutrophils Relative %: 74 % (ref 43–77)
RBC: 5.29 MIL/uL (ref 4.22–5.81)

## 2013-05-06 MED ORDER — SODIUM CHLORIDE 0.9 % IV BOLUS (SEPSIS)
1000.0000 mL | Freq: Once | INTRAVENOUS | Status: AC
  Administered 2013-05-06: 1000 mL via INTRAVENOUS

## 2013-05-06 MED ORDER — FENTANYL CITRATE 0.05 MG/ML IJ SOLN
100.0000 ug | Freq: Once | INTRAMUSCULAR | Status: AC
  Administered 2013-05-06: 100 ug via INTRAVENOUS
  Filled 2013-05-06: qty 2

## 2013-05-06 NOTE — ED Notes (Signed)
Pt c/o arm pain and sickle cell pain for few days. Pt was here 2 days ago for same. Pt states he was going to go to the pain clinic this morning but he couldn't wait d/t pain.

## 2013-05-06 NOTE — ED Provider Notes (Signed)
History    CSN: [id number] Arrival date & time 05/06/13  4098  First MD Initiated Contact with Patient 05/06/13 [phone]     Chief Complaint  Patient presents with  . Sickle Cell Pain Crisis   (Consider location/radiation/quality/duration/timing/severity/associated sxs/prior Treatment) Patient is a 27 y.o. male presenting with sickle cell pain.  Sickle Cell Pain Crisis  Pt with history of sickle cell disease reports severe aching R arm and R hip pain for the last several days. Seen in the ED for same earlier this week and given Rx for percocet and folate, but only filled the percocet Rx. He reports some improvement in pain, but it has returned. Denies CP, SOB, fever. Does not have PCP or regular medical contact. Has not had pain crisis in over a year prior to this one.   Past Medical History  Diagnosis Date  . Sickle cell disease    History reviewed. No pertinent past surgical history. History reviewed. No pertinent family history. History  Substance Use Topics  . Smoking status: Current Every Day Smoker -- 0.50 packs/day  . Smokeless tobacco: Not on file  . Alcohol Use: Yes    Review of Systems All other systems reviewed and are negative except as noted in HPI.   Allergies  Review of patient's allergies indicates no known allergies.  Home Medications   Current Outpatient Rx  Name  Route  Sig  Dispense  Refill  . folic acid (FOLVITE) 1 MG tablet   Oral   Take 1 tablet (1 mg total) by mouth daily.   30 tablet   0   . oxyCODONE (OXYCONTIN) 15 MG TB12   Oral   Take 1 tablet (15 mg total) by mouth every 12 (twelve) hours.   30 tablet   0   . oxyCODONE-acetaminophen (PERCOCET/ROXICET) 5-325 MG per tablet      1-2 tablets po q 6 hours prn moderate to severe pain   20 tablet   0   . aspirin-acetaminophen-caffeine (EXCEDRIN MIGRAINE) 250-250-65 MG per tablet   Oral   Take 1 tablet by mouth every 6 (six) hours as needed. PAIN         . EXPIRED: pantoprazole  (PROTONIX) 40 MG tablet   Oral   Take 1 tablet (40 mg total) by mouth daily at 12 noon.   30 tablet   1    BP 134/80  Pulse 80  Temp(Src) 98.1 F (36.7 C) (Oral)  Resp 16  SpO2 97% Physical Exam  Nursing note and vitals reviewed. Constitutional: He is oriented to person, place, and time. He appears well-developed and well-nourished.  HENT:  Head: Normocephalic and atraumatic.  Eyes: EOM are normal. Pupils are equal, round, and reactive to light.  Neck: Normal range of motion. Neck supple.  Cardiovascular: Normal rate, normal heart sounds and intact distal pulses.   Pulmonary/Chest: Effort normal and breath sounds normal.  Abdominal: Bowel sounds are normal. He exhibits no distension. There is no tenderness.  Musculoskeletal: Normal range of motion. He exhibits tenderness (soft tissue tenderness R upper arm). He exhibits no edema.  Neurological: He is alert and oriented to person, place, and time. He has normal strength. No cranial nerve deficit or sensory deficit.  Skin: Skin is warm and dry. No rash noted.  Psychiatric: He has a normal mood and affect.    ED Course  Procedures (including critical care time) Labs Reviewed  CBC WITH DIFFERENTIAL - Abnormal; Notable for the following:    HCT 37.7 (*)  MCV 71.3 (*)    MCHC 37.4 (*)    Neutro Abs 7.8 (*)    All other components within normal limits  RETICULOCYTES  COMPREHENSIVE METABOLIC PANEL   No results found. 1. Sickle cell disease with crisis     MDM  Labs unchanged, feeling better. Has arranged follow up at [facility] for tomorrow.   Charles B. Bernette Mayers, MD 05/06/13 [phone]

## 2013-05-06 NOTE — ED Notes (Signed)
Pt requesting prescription for narcotic pain medication. Pt visibly upset and refused to sign upon departure once told he would not be getting a prescription since he is to f/u at sickle cell clinic in the morning. Dr. Bernette Mayers sts pt received prescription 3 days ago.

## 2013-07-17 ENCOUNTER — Telehealth: Payer: Self-pay

## 2013-07-17 NOTE — Telephone Encounter (Signed)
This CM spoke with Ventura Sellers with community [place] agency to get another phone # for Mr. Hayduk ph# [phone] and alternate ph# [phone] (Mr. Lich mother).   This CM was unable to leave a message due to voicemail has not been set up.    Karoline Caldwell, RN, BSN, [place]     [phone]

## 2013-07-17 NOTE — Telephone Encounter (Signed)
This CM spoke with [name] to schedule appointment to establish care with Dr. Ashley Royalty.  Appointment: 07/20/2013@3 :30pm   Karoline Caldwell, RN, BSN, BS    [phone]

## 2013-07-20 ENCOUNTER — Ambulatory Visit: Payer: PRIVATE HEALTH INSURANCE | Admitting: Internal Medicine

## 2013-07-30 ENCOUNTER — Telehealth: Payer: Self-pay | Admitting: Internal Medicine

## 2013-07-30 NOTE — Telephone Encounter (Signed)
Test message

## 2013-08-11 ENCOUNTER — Ambulatory Visit: Payer: PRIVATE HEALTH INSURANCE | Admitting: Internal Medicine

## 2013-12-16 ENCOUNTER — Emergency Department (HOSPITAL_COMMUNITY): Payer: PRIVATE HEALTH INSURANCE

## 2013-12-16 ENCOUNTER — Encounter (HOSPITAL_COMMUNITY): Payer: Self-pay | Admitting: Radiology

## 2013-12-16 ENCOUNTER — Emergency Department (HOSPITAL_COMMUNITY): Payer: PRIVATE HEALTH INSURANCE | Admitting: Anesthesiology

## 2013-12-16 ENCOUNTER — Encounter (HOSPITAL_COMMUNITY): Payer: Medicaid Other | Admitting: Anesthesiology

## 2013-12-16 ENCOUNTER — Inpatient Hospital Stay (HOSPITAL_COMMUNITY)
Admission: EM | Admit: 2013-12-16 | Discharge: 2013-12-21 | DRG: 956 | Disposition: A | Discharge status: Rehabilitation Facility | Payer: PRIVATE HEALTH INSURANCE | Attending: General Surgery | Admitting: General Surgery

## 2013-12-16 ENCOUNTER — Other Ambulatory Visit: Payer: Self-pay

## 2013-12-16 ENCOUNTER — Encounter (HOSPITAL_COMMUNITY): Admission: EM | Disposition: A | Discharge status: Rehabilitation Facility | Payer: Self-pay | Source: Home / Self Care

## 2013-12-16 DIAGNOSIS — S066XAA Traumatic subarachnoid hemorrhage with loss of consciousness status unknown, initial encounter: Secondary | ICD-10-CM | POA: Diagnosis present

## 2013-12-16 DIAGNOSIS — S72009A Fracture of unspecified part of neck of unspecified femur, initial encounter for closed fracture: Principal | ICD-10-CM | POA: Diagnosis present

## 2013-12-16 DIAGNOSIS — S0291XA Unspecified fracture of skull, initial encounter for closed fracture: Secondary | ICD-10-CM | POA: Diagnosis present

## 2013-12-16 DIAGNOSIS — S069X9A Unspecified intracranial injury with loss of consciousness of unspecified duration, initial encounter: Secondary | ICD-10-CM

## 2013-12-16 DIAGNOSIS — S06369A Traumatic hemorrhage of cerebrum, unspecified, with loss of consciousness of unspecified duration, initial encounter: Secondary | ICD-10-CM | POA: Diagnosis present

## 2013-12-16 DIAGNOSIS — S7290XA Unspecified fracture of unspecified femur, initial encounter for closed fracture: Secondary | ICD-10-CM

## 2013-12-16 DIAGNOSIS — S065XAA Traumatic subdural hemorrhage with loss of consciousness status unknown, initial encounter: Secondary | ICD-10-CM | POA: Diagnosis present

## 2013-12-16 DIAGNOSIS — S1093XA Contusion of unspecified part of neck, initial encounter: Secondary | ICD-10-CM

## 2013-12-16 DIAGNOSIS — S0280XA Fracture of other specified skull and facial bones, unspecified side, initial encounter for closed fracture: Secondary | ICD-10-CM | POA: Diagnosis present

## 2013-12-16 DIAGNOSIS — S069XAA Unspecified intracranial injury with loss of consciousness status unknown, initial encounter: Secondary | ICD-10-CM

## 2013-12-16 DIAGNOSIS — IMO0002 Reserved for concepts with insufficient information to code with codable children: Secondary | ICD-10-CM

## 2013-12-16 DIAGNOSIS — D62 Acute posthemorrhagic anemia: Secondary | ICD-10-CM | POA: Diagnosis not present

## 2013-12-16 DIAGNOSIS — S0083XA Contusion of other part of head, initial encounter: Secondary | ICD-10-CM

## 2013-12-16 DIAGNOSIS — S065X9A Traumatic subdural hemorrhage with loss of consciousness of unspecified duration, initial encounter: Secondary | ICD-10-CM

## 2013-12-16 DIAGNOSIS — S020XXA Fracture of vault of skull, initial encounter for closed fracture: Secondary | ICD-10-CM

## 2013-12-16 DIAGNOSIS — S36899A Unspecified injury of other intra-abdominal organs, initial encounter: Secondary | ICD-10-CM | POA: Diagnosis present

## 2013-12-16 DIAGNOSIS — S72001A Fracture of unspecified part of neck of right femur, initial encounter for closed fracture: Secondary | ICD-10-CM | POA: Diagnosis present

## 2013-12-16 DIAGNOSIS — S066X9A Traumatic subarachnoid hemorrhage with loss of consciousness of unspecified duration, initial encounter: Secondary | ICD-10-CM | POA: Diagnosis present

## 2013-12-16 DIAGNOSIS — S0636AA Traumatic hemorrhage of cerebrum, unspecified, with loss of consciousness status unknown, initial encounter: Secondary | ICD-10-CM | POA: Diagnosis present

## 2013-12-16 DIAGNOSIS — D571 Sickle-cell disease without crisis: Secondary | ICD-10-CM | POA: Diagnosis present

## 2013-12-16 DIAGNOSIS — S064X9A Epidural hemorrhage with loss of consciousness of unspecified duration, initial encounter: Secondary | ICD-10-CM

## 2013-12-16 DIAGNOSIS — S7291XA Unspecified fracture of right femur, initial encounter for closed fracture: Secondary | ICD-10-CM | POA: Diagnosis present

## 2013-12-16 DIAGNOSIS — S72309A Unspecified fracture of shaft of unspecified femur, initial encounter for closed fracture: Secondary | ICD-10-CM | POA: Diagnosis present

## 2013-12-16 DIAGNOSIS — S0003XA Contusion of scalp, initial encounter: Secondary | ICD-10-CM | POA: Diagnosis present

## 2013-12-16 HISTORY — PX: FEMUR IM NAIL: SHX1597

## 2013-12-16 HISTORY — PX: COMPRESSION HIP SCREW: SHX1386

## 2013-12-16 HISTORY — PX: FRACTURE SURGERY: SHX138

## 2013-12-16 LAB — COMPREHENSIVE METABOLIC PANEL
ALT: 33 U/L (ref 0–53)
AST: 51 U/L — ABNORMAL HIGH (ref 0–37)
Albumin: 4 g/dL (ref 3.5–5.2)
Alkaline Phosphatase: 82 U/L (ref 39–117)
BUN: 12 mg/dL (ref 6–23)
CO2: 23 mEq/L (ref 19–32)
Calcium: 8.8 mg/dL (ref 8.4–10.5)
Chloride: 101 mEq/L (ref 96–112)
Creatinine, Ser: 1.13 mg/dL (ref 0.50–1.35)
GFR calc Af Amer: >90 mL/min (ref >90)
GFR calc non Af Amer: 88 mL/min — ABNORMAL LOW (ref >90)
Glucose, Bld: 137 mg/dL — ABNORMAL HIGH (ref 70–99)
Potassium: 3.5 mEq/L — ABNORMAL LOW (ref 3.7–5.3)
SODIUM: 141 meq/L (ref 137–147)
TOTAL PROTEIN: 7.4 g/dL (ref 6.0–8.3)
Total Bilirubin: 0.5 mg/dL (ref 0.3–1.2)

## 2013-12-16 LAB — CBC
HEMATOCRIT: 39.9 % (ref 39.0–52.0)
Hemoglobin: 14 g/dL (ref 13.0–17.0)
MCH: 25.9 pg — ABNORMAL LOW (ref 26.0–34.0)
MCHC: 35.1 g/dL (ref 30.0–36.0)
MCV: 73.8 fL — ABNORMAL LOW (ref 78.0–100.0)
Platelets: 261 10*3/uL (ref 150–400)
RBC: 5.41 MIL/uL (ref 4.22–5.81)
RDW: 14.8 % (ref 11.5–15.5)
WBC: 12.2 10*3/uL — AB (ref 4.0–10.5)

## 2013-12-16 LAB — POCT I-STAT, CHEM 8
BUN: 11 mg/dL (ref 6–23)
Calcium, Ion: 1.15 mmol/L (ref 1.12–1.23)
Chloride: 101 mEq/L (ref 96–112)
Creatinine, Ser: 1.3 mg/dL (ref 0.50–1.35)
Glucose, Bld: 136 mg/dL — ABNORMAL HIGH (ref 70–99)
HEMATOCRIT: 46 % (ref 39.0–52.0)
HEMOGLOBIN: 15.6 g/dL (ref 13.0–17.0)
Potassium: 3.3 mEq/L — ABNORMAL LOW (ref 3.7–5.3)
SODIUM: 141 meq/L (ref 137–147)
TCO2: 25 mmol/L (ref 0–100)

## 2013-12-16 LAB — CG4 I-STAT (LACTIC ACID): Lactic Acid, Venous: 2.01 mmol/L (ref 0.5–2.2)

## 2013-12-16 LAB — PROTIME-INR
INR: 1.08 (ref 0.00–1.49)
Prothrombin Time: 13.8 seconds (ref 11.6–15.2)

## 2013-12-16 LAB — CDS SEROLOGY

## 2013-12-16 LAB — ABO/RH: ABO/RH(D): O POS

## 2013-12-16 SURGERY — INSERTION, INTRAMEDULLARY ROD, FEMUR, RETROGRADE
Anesthesia: General | Site: Leg Upper | Laterality: Right

## 2013-12-16 MED ORDER — SODIUM CHLORIDE 0.9 % IJ SOLN
INTRAMUSCULAR | Status: AC
  Filled 2013-12-16: qty 10

## 2013-12-16 MED ORDER — LIDOCAINE HCL (CARDIAC) 20 MG/ML IV SOLN
INTRAVENOUS | Status: AC
  Filled 2013-12-16: qty 5

## 2013-12-16 MED ORDER — VECURONIUM BROMIDE 10 MG IV SOLR
INTRAVENOUS | Status: DC | PRN
  Administered 2013-12-16: 5 mg via INTRAVENOUS
  Administered 2013-12-16: 2 mg via INTRAVENOUS

## 2013-12-16 MED ORDER — VECURONIUM BROMIDE 10 MG IV SOLR
INTRAVENOUS | Status: AC
  Filled 2013-12-16: qty 10

## 2013-12-16 MED ORDER — SUCCINYLCHOLINE CHLORIDE 20 MG/ML IJ SOLN
INTRAMUSCULAR | Status: DC | PRN
  Administered 2013-12-16: 120 mg via INTRAVENOUS

## 2013-12-16 MED ORDER — MIDAZOLAM HCL 2 MG/2ML IJ SOLN
INTRAMUSCULAR | Status: AC
  Filled 2013-12-16: qty 2

## 2013-12-16 MED ORDER — FENTANYL CITRATE 0.05 MG/ML IJ SOLN
INTRAMUSCULAR | Status: AC
  Filled 2013-12-16: qty 5

## 2013-12-16 MED ORDER — SUCCINYLCHOLINE CHLORIDE 20 MG/ML IJ SOLN
INTRAMUSCULAR | Status: AC
  Filled 2013-12-16: qty 1

## 2013-12-16 MED ORDER — ALBUMIN HUMAN 5 % IV SOLN
INTRAVENOUS | Status: DC | PRN
  Administered 2013-12-16: 22:00:00 via INTRAVENOUS

## 2013-12-16 MED ORDER — CEFAZOLIN SODIUM 1-5 GM-% IV SOLN
INTRAVENOUS | Status: DC | PRN
  Administered 2013-12-16: 1 g via INTRAVENOUS

## 2013-12-16 MED ORDER — TETANUS-DIPHTH-ACELL PERTUSSIS 5-2.5-18.5 LF-MCG/0.5 IM SUSP
0.5000 mL | Freq: Once | INTRAMUSCULAR | Status: AC
  Administered 2013-12-16: 0.5 mL via INTRAMUSCULAR
  Filled 2013-12-16: qty 0.5

## 2013-12-16 MED ORDER — PROPOFOL 10 MG/ML IV BOLUS
INTRAVENOUS | Status: DC | PRN
  Administered 2013-12-16: 150 mg via INTRAVENOUS

## 2013-12-16 MED ORDER — ARTIFICIAL TEARS OP OINT
TOPICAL_OINTMENT | OPHTHALMIC | Status: AC
  Filled 2013-12-16: qty 3.5

## 2013-12-16 MED ORDER — PHENYLEPHRINE 40 MCG/ML (10ML) SYRINGE FOR IV PUSH (FOR BLOOD PRESSURE SUPPORT)
PREFILLED_SYRINGE | INTRAVENOUS | Status: AC
  Filled 2013-12-16: qty 10

## 2013-12-16 MED ORDER — MIDAZOLAM HCL 5 MG/5ML IJ SOLN
INTRAMUSCULAR | Status: DC | PRN
  Administered 2013-12-16 (×2): 0.5 mg via INTRAVENOUS

## 2013-12-16 MED ORDER — LACTATED RINGERS IV SOLN
INTRAVENOUS | Status: DC | PRN
  Administered 2013-12-16: 22:00:00 via INTRAVENOUS

## 2013-12-16 MED ORDER — GLYCOPYRROLATE 0.2 MG/ML IJ SOLN
INTRAMUSCULAR | Status: AC
  Filled 2013-12-16: qty 3

## 2013-12-16 MED ORDER — PHENYLEPHRINE HCL 10 MG/ML IJ SOLN
10.0000 mg | INTRAVENOUS | Status: DC | PRN
  Administered 2013-12-16: 25 ug/min via INTRAVENOUS

## 2013-12-16 MED ORDER — LIDOCAINE HCL (CARDIAC) 20 MG/ML IV SOLN
INTRAVENOUS | Status: DC | PRN
  Administered 2013-12-16: 50 mg via INTRAVENOUS

## 2013-12-16 MED ORDER — IOHEXOL 300 MG/ML  SOLN
100.0000 mL | Freq: Once | INTRAMUSCULAR | Status: AC | PRN
Start: 2013-12-16 — End: 2013-12-16
  Administered 2013-12-16: 100 mL via INTRAVENOUS

## 2013-12-16 MED ORDER — MORPHINE SULFATE 2 MG/ML IJ SOLN
INTRAMUSCULAR | Status: AC | PRN
  Administered 2013-12-16: 5 mg via INTRAVENOUS

## 2013-12-16 MED ORDER — 0.9 % SODIUM CHLORIDE (POUR BTL) OPTIME
TOPICAL | Status: DC | PRN
  Administered 2013-12-16: 1000 mL

## 2013-12-16 MED ORDER — ONDANSETRON HCL 4 MG/2ML IJ SOLN
4.0000 mg | Freq: Four times a day (QID) | INTRAMUSCULAR | Status: DC | PRN
  Administered 2013-12-16 – 2013-12-17 (×2): 4 mg via INTRAVENOUS
  Filled 2013-12-16 (×2): qty 2

## 2013-12-16 MED ORDER — STERILE WATER FOR INJECTION IJ SOLN
INTRAMUSCULAR | Status: AC
  Filled 2013-12-16: qty 10

## 2013-12-16 MED ORDER — FENTANYL CITRATE 0.05 MG/ML IJ SOLN
INTRAMUSCULAR | Status: DC | PRN
  Administered 2013-12-16 (×2): 50 ug via INTRAVENOUS
  Administered 2013-12-16: 100 ug via INTRAVENOUS
  Administered 2013-12-16 (×2): 50 ug via INTRAVENOUS
  Administered 2013-12-17 (×2): 25 ug via INTRAVENOUS

## 2013-12-16 MED ORDER — PHENYLEPHRINE HCL 10 MG/ML IJ SOLN
INTRAMUSCULAR | Status: DC | PRN
  Administered 2013-12-16 (×4): 80 ug via INTRAVENOUS

## 2013-12-16 MED ORDER — LACTATED RINGERS IV SOLN
INTRAVENOUS | Status: DC | PRN
  Administered 2013-12-16 – 2013-12-17 (×3): via INTRAVENOUS

## 2013-12-16 MED ORDER — DEXTROSE 5 % IV SOLN
2.0000 g | INTRAVENOUS | Status: AC
  Administered 2013-12-16: 2 g via INTRAVENOUS
  Filled 2013-12-16: qty 20

## 2013-12-16 MED ORDER — PROPOFOL 10 MG/ML IV BOLUS
INTRAVENOUS | Status: AC
  Filled 2013-12-16: qty 20

## 2013-12-16 MED ORDER — ONDANSETRON HCL 4 MG/2ML IJ SOLN
INTRAMUSCULAR | Status: AC
  Filled 2013-12-16: qty 2

## 2013-12-16 MED ORDER — ARTIFICIAL TEARS OP OINT
TOPICAL_OINTMENT | OPHTHALMIC | Status: DC | PRN
  Administered 2013-12-16: 1 via OPHTHALMIC

## 2013-12-16 SURGICAL SUPPLY — 82 items
BANDAGE ELASTIC 4 VELCRO ST LF (GAUZE/BANDAGES/DRESSINGS) IMPLANT
BANDAGE ELASTIC 6 VELCRO ST LF (GAUZE/BANDAGES/DRESSINGS) IMPLANT
BANDAGE ESMARK 6X9 LF (GAUZE/BANDAGES/DRESSINGS) IMPLANT
BENZOIN TINCTURE PRP APPL 2/3 (GAUZE/BANDAGES/DRESSINGS) ×4 IMPLANT
BIT DRILL 4.9 CANNULATED (BIT) ×2
BIT DRILL AO GAMMA 4.2X180 (BIT) ×4 IMPLANT
BIT DRILL AO GAMMA 4.2X340 (BIT) ×4 IMPLANT
BIT DRILL CANN QC 4.9 LRG (BIT) ×2 IMPLANT
BLADE SURG ROTATE 9660 (MISCELLANEOUS) IMPLANT
BNDG COHESIVE 6X5 TAN STRL LF (GAUZE/BANDAGES/DRESSINGS) ×4 IMPLANT
BNDG ESMARK 6X9 LF (GAUZE/BANDAGES/DRESSINGS)
CLOSURE WOUND 1/2 X4 (GAUZE/BANDAGES/DRESSINGS) ×2
CLOTH BEACON ORANGE TIMEOUT ST (SAFETY) ×4 IMPLANT
COVER SURGICAL LIGHT HANDLE (MISCELLANEOUS) ×8 IMPLANT
CUFF TOURNIQUET SINGLE 34IN LL (TOURNIQUET CUFF) IMPLANT
DRAPE C-ARM 42X72 X-RAY (DRAPES) ×4 IMPLANT
DRAPE C-ARMOR (DRAPES) ×4 IMPLANT
DRAPE ORTHO SPLIT 77X108 STRL (DRAPES) ×4
DRAPE PROXIMA HALF (DRAPES) ×4 IMPLANT
DRAPE STERI IOBAN 125X83 (DRAPES) ×4 IMPLANT
DRAPE SURG ORHT 6 SPLT 77X108 (DRAPES) ×4 IMPLANT
DRAPE U-SHAPE 47X51 STRL (DRAPES) ×4 IMPLANT
DRILL BIT CANNULATED 4.9 (BIT) ×2
DRSG EMULSION OIL 3X3 NADH (GAUZE/BANDAGES/DRESSINGS) IMPLANT
DRSG MEPILEX BORDER 4X4 (GAUZE/BANDAGES/DRESSINGS) ×16 IMPLANT
DRSG MEPILEX BORDER 4X8 (GAUZE/BANDAGES/DRESSINGS) ×4 IMPLANT
DRSG TEGADERM 4X4.75 (GAUZE/BANDAGES/DRESSINGS) IMPLANT
DURAPREP 26ML APPLICATOR (WOUND CARE) ×4 IMPLANT
ELECT REM PT RETURN 9FT ADLT (ELECTROSURGICAL) ×4
ELECTRODE REM PT RTRN 9FT ADLT (ELECTROSURGICAL) ×2 IMPLANT
GAUZE SPONGE 2X2 8PLY STRL LF (GAUZE/BANDAGES/DRESSINGS) IMPLANT
GLOVE BIO SURGEON STRL SZ 6.5 (GLOVE) ×3 IMPLANT
GLOVE BIO SURGEON STRL SZ7.5 (GLOVE) ×16 IMPLANT
GLOVE BIO SURGEON STRL SZ8 (GLOVE) ×8 IMPLANT
GLOVE BIO SURGEONS STRL SZ 6.5 (GLOVE) ×1
GLOVE BIOGEL PI IND STRL 7.5 (GLOVE) ×2 IMPLANT
GLOVE BIOGEL PI IND STRL 8 (GLOVE) ×4 IMPLANT
GLOVE BIOGEL PI INDICATOR 7.5 (GLOVE) ×2
GLOVE BIOGEL PI INDICATOR 8 (GLOVE) ×4
GLOVE SURG SS PI 7.5 STRL IVOR (GLOVE) ×4 IMPLANT
GOWN PREVENTION PLUS XLARGE (GOWN DISPOSABLE) ×4 IMPLANT
GOWN STRL NON-REIN LRG LVL3 (GOWN DISPOSABLE) ×8 IMPLANT
GUIDE PIN THREADED (PIN) ×4
GUIDEROD T2 3X1000 (ROD) ×4 IMPLANT
GUIDEWIRE GAMMA (WIRE) ×4 IMPLANT
GUIDEWIRE THRD ASNIS 3.2X300 (WIRE) ×4 IMPLANT
HIP PLATE STD 130 4H (Plate) ×4 IMPLANT
IMMOBILIZER KNEE 24 THIGH 36 (MISCELLANEOUS) ×2 IMPLANT
IMMOBILIZER KNEE 24 UNIV (MISCELLANEOUS) ×4
KIT BASIN OR (CUSTOM PROCEDURE TRAY) ×4 IMPLANT
KIT ROOM TURNOVER OR (KITS) ×4 IMPLANT
MANIFOLD NEPTUNE II (INSTRUMENTS) ×4 IMPLANT
NAIL FEMORAL A/R 10X360 HIP (Nail) ×4 IMPLANT
NEEDLE 22X1 1/2 (OR ONLY) (NEEDLE) IMPLANT
NS IRRIG 1000ML POUR BTL (IV SOLUTION) ×4 IMPLANT
PACK GENERAL/GYN (CUSTOM PROCEDURE TRAY) ×4 IMPLANT
PAD ARMBOARD 7.5X6 YLW CONV (MISCELLANEOUS) ×8 IMPLANT
PIN GUIDE THREADED (PIN) ×2 IMPLANT
REAMER SHAFT BIXCUT (INSTRUMENTS) ×4 IMPLANT
SCREW ASNIS 90MM (Screw) ×4 IMPLANT
SCREW CORTEX ST MATTA 4.5X40MM (Screw) ×8 IMPLANT
SCREW LOCKING T2 F/T  5MMX35MM (Screw) ×2 IMPLANT
SCREW LOCKING T2 F/T  5MMX50MM (Screw) ×2 IMPLANT
SCREW LOCKING T2 F/T 5MMX35MM (Screw) ×2 IMPLANT
SCREW LOCKING T2 F/T 5MMX50MM (Screw) ×2 IMPLANT
SCREW OMEGA STD 90X22X13XLG (Screw) ×2 IMPLANT
SCREW OMEGA STD LAG (Screw) ×2 IMPLANT
SPONGE GAUZE 2X2 STER 10/PKG (GAUZE/BANDAGES/DRESSINGS)
SPONGE GAUZE 4X4 12PLY (GAUZE/BANDAGES/DRESSINGS) IMPLANT
STAPLER VISISTAT 35W (STAPLE) ×4 IMPLANT
STOCKINETTE IMPERVIOUS LG (DRAPES) ×4 IMPLANT
STRIP CLOSURE SKIN 1/2X4 (GAUZE/BANDAGES/DRESSINGS) ×6 IMPLANT
SUT MNCRL AB 4-0 PS2 18 (SUTURE) ×4 IMPLANT
SUT MON AB 2-0 CT1 27 (SUTURE) ×4 IMPLANT
SUT MON AB 2-0 CT1 36 (SUTURE) ×4 IMPLANT
SUT VIC AB 0 CT1 27 (SUTURE) ×4
SUT VIC AB 0 CT1 27XBRD ANBCTR (SUTURE) ×4 IMPLANT
SYR CONTROL 10ML LL (SYRINGE) IMPLANT
TOWEL OR 17X24 6PK STRL BLUE (TOWEL DISPOSABLE) ×4 IMPLANT
TOWEL OR 17X26 10 PK STRL BLUE (TOWEL DISPOSABLE) ×4 IMPLANT
TOWEL OR NON WOVEN STRL DISP B (DISPOSABLE) ×4 IMPLANT
WATER STERILE IRR 1000ML POUR (IV SOLUTION) ×4 IMPLANT

## 2013-12-16 NOTE — Anesthesia Procedure Notes (Signed)
Procedure Name: Intubation Date/Time: 12/16/2013 9:58 PM Performed by: Vaughan Browner Pre-anesthesia Checklist: Patient identified, Emergency Drugs available, Suction available and Patient being monitored Patient Re-evaluated:Patient Re-evaluated prior to inductionOxygen Delivery Method: Circle system utilized Preoxygenation: Pre-oxygenation with 100% oxygen Intubation Type: Rapid sequence, IV induction and Cricoid Pressure applied Grade View: Grade I Tube type: Oral Number of attempts: 1 Airway Equipment and Method: Video-laryngoscopy and Stylet Placement Confirmation: ETT inserted through vocal cords under direct vision,  positive ETCO2 and breath sounds checked- equal and bilateral Secured at: 22 cm Tube secured with: Tape Dental Injury: Teeth and Oropharynx as per pre-operative assessment  Comments: Neck maintained in c-collar. Head/neck maintained in neutral position throughout Glidescope and intubation.
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
## 2013-12-16 NOTE — ED Notes (Signed)
To ct with RN accompanying.

## 2013-12-16 NOTE — ED Notes (Signed)
Pt to OR with RN accompanying, SR on monitor, alert, answers questions appropriately.  Pain 6/10.

## 2013-12-16 NOTE — ED Notes (Signed)
Mother in waiting room.  xrays taking place in room, then to CT.
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
## 2013-12-16 NOTE — Procedures (Signed)
FAST  Focused abdominal sonogram for trauma performed at the bedside   No evidence of intra-abdominal bleeding in all four windows.    CT scan to follow.  Kathryne Eriksson. Dahlia Bailiff, MD, [place] [phone] Trauma Surgeon
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
## 2013-12-16 NOTE — ED Notes (Signed)
+   cervical tenderness reported to dr cram during exam.  Pt responded appropriately to Dr Saintclair Halsted exam. Small amount of bleeding to brain where bruise is noted.  Dr Saintclair Halsted feels he will do well with this and informed mother.
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
## 2013-12-16 NOTE — Progress Notes (Signed)
Orthopedic Tech Progress Note Patient Details:  James Hamilton Jun 25, 1986 [id number]  Musculoskeletal Traction Type of Traction: [name] Traction Traction Location: RLE    [name] 12/16/2013, 8:21 PM
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
## 2013-12-17 ENCOUNTER — Encounter (HOSPITAL_COMMUNITY): Payer: Self-pay

## 2013-12-17 ENCOUNTER — Inpatient Hospital Stay (HOSPITAL_COMMUNITY): Payer: PRIVATE HEALTH INSURANCE

## 2013-12-17 DIAGNOSIS — S72001A Fracture of unspecified part of neck of right femur, initial encounter for closed fracture: Secondary | ICD-10-CM | POA: Diagnosis present

## 2013-12-17 DIAGNOSIS — S06369A Traumatic hemorrhage of cerebrum, unspecified, with loss of consciousness of unspecified duration, initial encounter: Secondary | ICD-10-CM | POA: Diagnosis present

## 2013-12-17 DIAGNOSIS — S0291XA Unspecified fracture of skull, initial encounter for closed fracture: Secondary | ICD-10-CM | POA: Diagnosis present

## 2013-12-17 DIAGNOSIS — S066XAA Traumatic subarachnoid hemorrhage with loss of consciousness status unknown, initial encounter: Secondary | ICD-10-CM | POA: Diagnosis present

## 2013-12-17 DIAGNOSIS — D571 Sickle-cell disease without crisis: Secondary | ICD-10-CM | POA: Insufficient documentation

## 2013-12-17 DIAGNOSIS — D62 Acute posthemorrhagic anemia: Secondary | ICD-10-CM | POA: Diagnosis not present

## 2013-12-17 DIAGNOSIS — S0636AA Traumatic hemorrhage of cerebrum, unspecified, with loss of consciousness status unknown, initial encounter: Secondary | ICD-10-CM | POA: Diagnosis present

## 2013-12-17 DIAGNOSIS — S7291XA Unspecified fracture of right femur, initial encounter for closed fracture: Secondary | ICD-10-CM | POA: Diagnosis present

## 2013-12-17 DIAGNOSIS — S066X9A Traumatic subarachnoid hemorrhage with loss of consciousness of unspecified duration, initial encounter: Secondary | ICD-10-CM | POA: Diagnosis present

## 2013-12-17 LAB — URINALYSIS, ROUTINE W REFLEX MICROSCOPIC
Bilirubin Urine: NEGATIVE
Glucose, UA: NEGATIVE mg/dL
Ketones, ur: NEGATIVE mg/dL
NITRITE: NEGATIVE
PH: 5.5 (ref 5.0–8.0)
Protein, ur: NEGATIVE mg/dL
SPECIFIC GRAVITY, URINE: 1.016 (ref 1.005–1.030)
UROBILINOGEN UA: 0.2 mg/dL (ref 0.0–1.0)

## 2013-12-17 LAB — CBC
HCT: 34.1 % — ABNORMAL LOW (ref 39.0–52.0)
HEMOGLOBIN: 12.2 g/dL — AB (ref 13.0–17.0)
MCH: 26.8 pg (ref 26.0–34.0)
MCHC: 35.8 g/dL (ref 30.0–36.0)
MCV: 74.9 fL — ABNORMAL LOW (ref 78.0–100.0)
PLATELETS: 163 10*3/uL (ref 150–400)
RBC: 4.55 MIL/uL (ref 4.22–5.81)
RDW: 15.2 % (ref 11.5–15.5)
WBC: 11.5 10*3/uL — AB (ref 4.0–10.5)

## 2013-12-17 LAB — URINE MICROSCOPIC-ADD ON

## 2013-12-17 LAB — BASIC METABOLIC PANEL
BUN: 12 mg/dL (ref 6–23)
CHLORIDE: 102 meq/L (ref 96–112)
CO2: 21 meq/L (ref 19–32)
Calcium: 8.1 mg/dL — ABNORMAL LOW (ref 8.4–10.5)
Creatinine, Ser: 1.02 mg/dL (ref 0.50–1.35)
GFR calc Af Amer: >90 mL/min (ref >90)
GFR calc non Af Amer: >90 mL/min (ref >90)
Glucose, Bld: 170 mg/dL — ABNORMAL HIGH (ref 70–99)
POTASSIUM: 4.7 meq/L (ref 3.7–5.3)
SODIUM: 136 meq/L — AB (ref 137–147)

## 2013-12-17 LAB — POCT I-STAT 4, (NA,K, GLUC, HGB,HCT)
Glucose, Bld: 110 mg/dL — ABNORMAL HIGH (ref 70–99)
HCT: 25 % — ABNORMAL LOW (ref 39.0–52.0)
HEMOGLOBIN: 8.5 g/dL — AB (ref 13.0–17.0)
POTASSIUM: 3.8 meq/L (ref 3.7–5.3)
Sodium: 144 mEq/L (ref 137–147)

## 2013-12-17 LAB — PREPARE RBC (CROSSMATCH)

## 2013-12-17 MED ORDER — ONDANSETRON HCL 4 MG/2ML IJ SOLN: 4.0000 mg | Freq: Four times a day (QID) | INTRAMUSCULAR | Status: DC | PRN

## 2013-12-17 MED ORDER — HYDROMORPHONE 0.3 MG/ML IV SOLN
INTRAVENOUS | Status: AC
  Administered 2013-12-17: 0.5 mL via INTRAVENOUS
  Filled 2013-12-17: qty 25

## 2013-12-17 MED ORDER — NALOXONE HCL 0.4 MG/ML IJ SOLN: 0.4000 mg | INTRAMUSCULAR | Status: DC | PRN

## 2013-12-17 MED ORDER — NEOSTIGMINE METHYLSULFATE 1 MG/ML IJ SOLN
INTRAMUSCULAR | Status: DC | PRN
  Administered 2013-12-17: 4 mg via INTRAVENOUS

## 2013-12-17 MED ORDER — CEFAZOLIN SODIUM 1-5 GM-% IV SOLN
1.0000 g | Freq: Three times a day (TID) | INTRAVENOUS | Status: DC
  Administered 2013-12-17 – 2013-12-21 (×13): 1 g via INTRAVENOUS
  Filled 2013-12-17 (×15): qty 50

## 2013-12-17 MED ORDER — CEFAZOLIN SODIUM-DEXTROSE 2-3 GM-% IV SOLR
2.0000 g | Freq: Four times a day (QID) | INTRAVENOUS | Status: DC
  Filled 2013-12-17 (×2): qty 50

## 2013-12-17 MED ORDER — HYDROMORPHONE HCL PF 1 MG/ML IJ SOLN: 0.2500 mg | INTRAMUSCULAR | Status: DC | PRN

## 2013-12-17 MED ORDER — SODIUM CHLORIDE 0.9 % IJ SOLN
9.0000 mL | INTRAMUSCULAR | Status: DC | PRN
Start: 2013-12-17 — End: 2013-12-20

## 2013-12-17 MED ORDER — OXYCODONE HCL 5 MG PO TABS: 5.0000 mg | ORAL_TABLET | Freq: Once | ORAL | Status: DC | PRN

## 2013-12-17 MED ORDER — KCL IN DEXTROSE-NACL 20-5-0.45 MEQ/L-%-% IV SOLN
INTRAVENOUS | Status: DC
  Administered 2013-12-17: 100 mL/h via INTRAVENOUS
  Filled 2013-12-17 (×2): qty 1000

## 2013-12-17 MED ORDER — ESMOLOL HCL 10 MG/ML IV SOLN
INTRAVENOUS | Status: AC
  Filled 2013-12-17: qty 10

## 2013-12-17 MED ORDER — ESMOLOL HCL 10 MG/ML IV SOLN
INTRAVENOUS | Status: DC | PRN
  Administered 2013-12-17: 10 mg via INTRAVENOUS

## 2013-12-17 MED ORDER — PANTOPRAZOLE SODIUM 40 MG PO TBEC
40.0000 mg | DELAYED_RELEASE_TABLET | Freq: Every day | ORAL | Status: DC
  Administered 2013-12-19 – 2013-12-21 (×3): 40 mg via ORAL
  Filled 2013-12-17 (×3): qty 1

## 2013-12-17 MED ORDER — POTASSIUM CHLORIDE IN NACL 20-0.9 MEQ/L-% IV SOLN
INTRAVENOUS | Status: DC
  Administered 2013-12-17 – 2013-12-18 (×3): via INTRAVENOUS
  Administered 2013-12-18: 100 mL/h via INTRAVENOUS
  Administered 2013-12-19 – 2013-12-20 (×3): via INTRAVENOUS
  Administered 2013-12-21: 1000 mL via INTRAVENOUS
  Filled 2013-12-17 (×10): qty 1000

## 2013-12-17 MED ORDER — DIPHENHYDRAMINE HCL 50 MG/ML IJ SOLN
12.5000 mg | Freq: Four times a day (QID) | INTRAMUSCULAR | Status: DC | PRN
Start: 2013-12-17 — End: 2013-12-20

## 2013-12-17 MED ORDER — GLYCOPYRROLATE 0.2 MG/ML IJ SOLN
INTRAMUSCULAR | Status: DC | PRN
  Administered 2013-12-17: 0.6 mg via INTRAVENOUS

## 2013-12-17 MED ORDER — OXYCODONE HCL 5 MG/5ML PO SOLN: 5.0000 mg | Freq: Once | ORAL | Status: DC | PRN

## 2013-12-17 MED ORDER — PANTOPRAZOLE SODIUM 40 MG IV SOLR
40.0000 mg | Freq: Every day | INTRAVENOUS | Status: DC
  Administered 2013-12-17 – 2013-12-18 (×2): 40 mg via INTRAVENOUS
  Filled 2013-12-17 (×2): qty 40

## 2013-12-17 MED ORDER — DIPHENHYDRAMINE HCL 12.5 MG/5ML PO ELIX
12.5000 mg | ORAL_SOLUTION | Freq: Four times a day (QID) | ORAL | Status: DC | PRN
  Filled 2013-12-17: qty 5

## 2013-12-17 MED ORDER — HYDROMORPHONE 0.3 MG/ML IV SOLN
INTRAVENOUS | Status: DC
  Administered 2013-12-17: 0.3 mg via INTRAVENOUS
  Administered 2013-12-17: 02:00:00 via INTRAVENOUS
  Administered 2013-12-17: 0.3 mg via INTRAVENOUS
  Administered 2013-12-17: 0.6 mg via INTRAVENOUS
  Administered 2013-12-17 – 2013-12-18 (×2): 0.9 mg via INTRAVENOUS
  Administered 2013-12-18: 0.6 mg via INTRAVENOUS
  Administered 2013-12-18: 0.3 mg via INTRAVENOUS
  Administered 2013-12-18: 0.6 mg via INTRAVENOUS
  Administered 2013-12-18: 0.9 mg via INTRAVENOUS
  Administered 2013-12-18: 0.3 mg via INTRAVENOUS
  Administered 2013-12-18: 0.6 mg via INTRAVENOUS
  Administered 2013-12-19 (×2): 0.9 mg via INTRAVENOUS
  Administered 2013-12-20: 0.3 mg via INTRAVENOUS
  Administered 2013-12-20: 0.9 mg via INTRAVENOUS
  Filled 2013-12-17: qty 25

## 2013-12-17 MED ORDER — SODIUM CHLORIDE 0.9 % IV SOLN: INTRAVENOUS | Status: DC
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
## 2013-12-17 NOTE — Progress Notes (Signed)
Pt awoke, Asked to pee, wasn't able to cooperate with questions.
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
## 2013-12-17 NOTE — Progress Notes (Signed)
OT Cancellation Note  Patient Details Name: James Hamilton MRN: [id number] DOB: May 29, 1986   Cancelled Treatment:    Reason Eval/Treat Not Completed: Medical issues which prohibited therapy (Awaiting orders to increase actitvity after last scan. )  [place], OTR/L  [phone] 12/17/2013 12/17/2013, 12:28 PM
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
## 2013-12-17 NOTE — Transfer of Care (Signed)
Immediate Anesthesia Transfer of Care Note  Patient: James Hamilton  Procedure(s) Performed: Procedure(s): INTRAMEDULLARY (IM) RETROGRADE FEMORAL NAILING (Right) COMPRESSION HIP (Right)  Patient Location: PACU  Anesthesia Type:General  Level of Consciousness: responds to stimulation  Airway & Oxygen Therapy: Patient Spontanous Breathing and Patient connected to nasal cannula oxygen  Post-op Assessment: Report given to PACU RN and Post -op Vital signs reviewed and stable  Post vital signs: Reviewed and stable  Complications: No apparent anesthesia complications
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
## 2013-12-17 NOTE — Progress Notes (Signed)
PT Cancellation Note  Patient Details Name: James Hamilton MRN: [id number] DOB: June 15, 1986   Cancelled Treatment:    Reason Eval/Treat Not Completed: Medical issues which prohibited therapy;Patient not medically ready. Awaiting test results to clear pt medically. Will re-attempt to see pt this afternoon if appropriate.    Elie Confer [place], [place] 12/17/2013, 8:35 AM

## 2013-12-17 NOTE — Op Note (Signed)
12/16/2013 - 12/17/2013  12:32 AM  PATIENT:  James Hamilton    PRE-OPERATIVE DIAGNOSIS:  Multiple Fractures Right leg  POST-OPERATIVE DIAGNOSIS:  Same  PROCEDURE:  INTRAMEDULLARY (IM) RETROGRADE FEMORAL NAILING, COMPRESSION HIP  SURGEON:  MURPHY, TIMOTHY, D, MD  ASSISTANT: Joya Gaskins, OPA  ANESTHESIA:   Gen  PREOPERATIVE INDICATIONS:  James Hamilton is a  28 y.o. male with a diagnosis of Multiple Fractures Right leg who failed conservative measures and elected for surgical management.    The risks benefits and alternatives were discussed with the patient preoperatively including but not limited to the risks of infection, bleeding, nerve injury, cardiopulmonary complications, the need for revision surgery, among others, and the patient was willing to proceed.  OPERATIVE IMPLANTS: Stryker DHS, Canulated 6.5 screw, Retrograde nail.   BLOOD LOSS: 716  COMPLICATIONS: none  TOURNIQUET TIME: none  OPERATIVE PROCEDURE:  Patient was identified in the preoperative holding area and site was marked by me He was transported to the operating theater and placed on the table in supine position taking care to pad all bony prominences. After a preincinduction time out anesthesia was induced. The right lower extremity was prepped and draped in normal sterile fashion and a pre-incision timeout was performed. He received Ancef for preoperative antibiotics.   I first took multiple x-rays of the hip closed to confirm relatively stable alignment good reduction with mild abduction no displacement in the lateral x-ray. I then performed examination of the right knee with x-ray examination the knee opened to valgus stressing indicating an MCL injury and there was a soft endpoint on Lachman. He a firm stop to varus as well as posterior drawer specifically Aleve has had an anterior cruciate ligament tear as well as a MCL tear.  I then turned my attention to the fracture the hand. I made a lateral 7 cm  incision just distal greater trochanter for reduction and fixation of the femoral neck fracture with a little lateral pressure and abduction of the leg the fracture reduced very well so I placed a guidepin for the 65 cannulated screw tibia superiorly to use the lag aspect of the screw close to the fracture and keep it out of the way the DHS lag screw. I placed a screw and was very happy with the bite and reduction of the fracture. I then placed a guidewire for 130 DHS screw and inserted to a satisfactory position I selected a appropriate DHS screw length and drilled with the entry reamer I had extremely hard bone this took a while then as where placing the lag screw we had an issue with partial stripping of the linkage between the inserter and the lag screw I felt as though he didn't have it down off and removing it would be extremely difficult as partial stripping side elected to leave aware was and was happy with its position all the threads crossed the fracture and the back it was not proud over the plate. I then inserted a 4 hole plate to bridge the proximal end of the femoral nail. Placed a 2 proximal screw holes had excellent bite with these.  I then turned my attention to the diaphyseal fracture which I selected a retrograde femoral nail to fix this. I made a medial parapatellar incision and approached the knee joint. There is a significant hemarthrosis that was alleviated with this approach. I inserted the guidepin to a satisfactory position at the tip of assess line and central in the notch and I inserted the guidepin  and then opened the canal of the entry reamer. I then placed a ball-tipped guidewire I did have to make a small 3 cm incision to affect a reduction and used the finger to get the guidewire across.  I then held appropriate reduction and sequentially reamed up to 11.5 reamer with good chatter and selected a 10 mm nail with the length of 360 so that the tip of the overlapping with the base  of the DHS and not leave a stress riser. I used the outrigger placed a distal static interlock taking care to keep the knee pointed straight up and used a perfect circles to place a proximal interlock. I took multiple final fluoroscopic views and was happy with the alignment of all fractures and hardware. I then thoroughly irrigated all wounds and closed them in layer with absorbable suture. His placed a sterile dressing and a knee immobilizer and taken the PACU in stable condition.  POST OPERATIVE PLAN: Nonweightbearing of the right lower extremity he'll obtain an MRI of his right knee he was placed in the immobilizer. I will order SCDs and hold off on chemical DVT prophylaxis to allow the trauma team the or this in light of his possible head injury there is no orthopedic contraindication to chemical prophylaxis.

## 2013-12-17 NOTE — Progress Notes (Signed)
UR completed.  Jaquanna Ballentine, RN BSN [facility] CCM Trauma/Neuro ICU Case Manager [phone]  
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
## 2013-12-17 NOTE — Progress Notes (Signed)
Patient ID: James Hamilton, male   DOB: [birth year], 28 y.o.   MRN: [id number] Patient remains somnolent but arousable  Follows commands and moves all extremities with a lot of stimulation.  Repeat CT of his head is pending continue observation in the ICU

## 2013-12-18 ENCOUNTER — Inpatient Hospital Stay (HOSPITAL_COMMUNITY): Payer: PRIVATE HEALTH INSURANCE

## 2013-12-18 ENCOUNTER — Inpatient Hospital Stay (HOSPITAL_COMMUNITY): Payer: Medicaid Other

## 2013-12-18 ENCOUNTER — Encounter (HOSPITAL_COMMUNITY): Payer: Self-pay | Admitting: Physical Medicine and Rehabilitation

## 2013-12-18 DIAGNOSIS — S065XAA Traumatic subdural hemorrhage with loss of consciousness status unknown, initial encounter: Secondary | ICD-10-CM

## 2013-12-18 DIAGNOSIS — IMO0002 Reserved for concepts with insufficient information to code with codable children: Secondary | ICD-10-CM

## 2013-12-18 DIAGNOSIS — S72309A Unspecified fracture of shaft of unspecified femur, initial encounter for closed fracture: Secondary | ICD-10-CM

## 2013-12-18 DIAGNOSIS — S065X9A Traumatic subdural hemorrhage with loss of consciousness of unspecified duration, initial encounter: Secondary | ICD-10-CM

## 2013-12-18 LAB — CBC
HCT: 31.2 % — ABNORMAL LOW (ref 39.0–52.0)
Hemoglobin: 11 g/dL — ABNORMAL LOW (ref 13.0–17.0)
MCH: 26.4 pg (ref 26.0–34.0)
MCHC: 35.3 g/dL (ref 30.0–36.0)
MCV: 74.8 fL — AB (ref 78.0–100.0)
Platelets: 157 10*3/uL (ref 150–400)
RBC: 4.17 MIL/uL — ABNORMAL LOW (ref 4.22–5.81)
RDW: 15 % (ref 11.5–15.5)
WBC: 12.3 10*3/uL — AB (ref 4.0–10.5)

## 2013-12-18 LAB — BASIC METABOLIC PANEL
BUN: 12 mg/dL (ref 6–23)
CHLORIDE: 104 meq/L (ref 96–112)
CO2: 27 mEq/L (ref 19–32)
CREATININE: 1.15 mg/dL (ref 0.50–1.35)
Calcium: 8.4 mg/dL (ref 8.4–10.5)
GFR calc non Af Amer: 87 mL/min — ABNORMAL LOW (ref >90)
Glucose, Bld: 122 mg/dL — ABNORMAL HIGH (ref 70–99)
POTASSIUM: 4.7 meq/L (ref 3.7–5.3)
Sodium: 143 mEq/L (ref 137–147)
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
## 2013-12-18 NOTE — Progress Notes (Signed)
Rehab Admissions Coordinator Note:  Patient was screened by Marchetta Navratil L for appropriateness for an Inpatient Acute Rehab Consult.  At this time, we are recommending Inpatient Rehab consult.  Caley Volkert, PT Rehabilitation Admissions Coordinator [phone]  

## 2013-12-18 NOTE — Evaluation (Signed)
Clinical/Bedside Swallow Evaluation Patient Details  Name: James Hamilton MRN: [id number] Date of Birth: 08-Oct-1986  Today's Date: 12/18/2013 Time: 0852-0910 SLP Time Calculation (min): 18 min  Past Medical History:  Past Medical History  Diagnosis Date  . Sickle cell anemia    Past Surgical History: History reviewed. No pertinent past surgical history. HPI:  28 yr old apparently with of Sickle cell.  CT Marked progression of hemorrhagic contusions in the right frontal lobe, left frontal lobe, and right temporal lobe. Large area hemorrhagic contusion is not present in the right frontal lobe.  Improvement in subdural hemorrhage and subarachnoid hemorrhage on the right. 5.7 mm midline shift to the left.  CXR Patchy infiltrate or contusion in the posterior left lower lobe.   Assessment / Plan / Recommendation Clinical Impression  Pt. slightly lethargic with PCA pump for pain management, however easily aroused.  No inidications of poor airway protection, pharyngeal weakness or deficits with sensorimotor tract.  He requires max supervision to ensure small bites and sips, eat/drink slowly.  SLP recommends regular texture diet with parents provided menu and advised to choose more mechanical soft texture for first day (grilled cheese, soft peaches).  Full supervision, pills with liquids and continued ST for safety and tolerance.    Aspiration Risk  Moderate    Diet Recommendation Regular;Thin liquid   Liquid Administration via: Straw;Cup Medication Administration: Whole meds with liquid Supervision: Patient able to self feed;Full supervision/cueing for compensatory strategies Compensations: Slow rate;Small sips/bites Postural Changes and/or Swallow Maneuvers: Seated upright 90 degrees    Other  Recommendations Oral Care Recommendations: Oral care BID   Follow Up Recommendations  Inpatient Rehab    Frequency and Duration min 2x/week  2 weeks   Pertinent Vitals/Pain WDL          Swallow Study           Oral/Motor/Sensory Function Overall Oral Motor/Sensory Function: Appears within functional limits for tasks assessed   Ice Chips Ice chips: Within functional limits   Thin Liquid Thin Liquid: Within functional limits Presentation: Cup;Straw    Nectar Thick Nectar Thick Liquid: Not tested   Honey Thick Honey Thick Liquid: Not tested   Puree Puree: Within functional limits   Solid   GO    Solid: Within functional limits       Houston Siren M.Ed [employer] [phone]  12/18/2013
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
## 2013-12-18 NOTE — Anesthesia Postprocedure Evaluation (Signed)
Anesthesia Post Note  Patient: James Hamilton  Procedure(s) Performed: Procedure(s) (LRB): INTRAMEDULLARY (IM) RETROGRADE FEMORAL NAILING (Right) COMPRESSION HIP (Right)  Anesthesia type: General  Patient location: PACU  Post pain: Pain level controlled and Adequate analgesia  Post assessment: Post-op Vital signs reviewed, Patient's Cardiovascular Status Stable, Respiratory Function Stable, Patent Airway and Pain level controlled  Last Vitals:  Filed Vitals:   12/18/13 0812  BP:   Pulse:   Temp: 37.1 C  Resp:     Post vital signs: Reviewed and stable  Level of consciousness: awake, alert  and oriented  Complications: No apparent anesthesia complications

## 2013-12-19 ENCOUNTER — Inpatient Hospital Stay (HOSPITAL_COMMUNITY): Payer: PRIVATE HEALTH INSURANCE

## 2013-12-19 MED ORDER — OXYCODONE-ACETAMINOPHEN 5-325 MG PO TABS
1.0000 | ORAL_TABLET | ORAL | Status: DC | PRN
  Administered 2013-12-20 – 2013-12-21 (×5): 2 via ORAL
  Filled 2013-12-19 (×5): qty 2
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
## 2013-12-19 NOTE — Progress Notes (Signed)
Subjective: 3 Days Post-Op Procedure(s) (LRB): INTRAMEDULLARY (IM) RETROGRADE FEMORAL NAILING (Right) COMPRESSION HIP (Right) Patient reports pain as 1 on 0-10 scale.  Reports very minimal pain.  Patient is more alert today than he has been since being admitted.    Objective: Vital signs in last 24 hours: Temp:  [98.1 F (36.7 C)-98.5 F (36.9 C)] 98.3 F (36.8 C) (02/07 0737) Pulse Rate:  [71-125] 90 (02/07 0800) Resp:  [13-24] 17 (02/07 1000) BP: (117-158)/(59-107) 133/67 mmHg (02/07 1000) SpO2:  [94 %-100 %] 100 % (02/07 0800) FiO2 (%):  [0 %] 0 % (02/07 0903)  Intake/Output from previous day: 02/06 0701 - 02/07 0700 In: 3412.8 [P.O.:1440; I.V.:1772.8; IV Piggyback:200] Out: 2005 [Urine:2005] Intake/Output this shift: Total I/O In: 175 [I.V.:175] Out: -    Recent Labs  12/16/13 2012 12/16/13 2013 12/16/13 2247 12/17/13 0550 12/18/13 0211  HGB 14.0 15.6 8.5* 12.2* 11.0*    Recent Labs  12/17/13 0550 12/18/13 0211  WBC 11.5* 12.3*  RBC 4.55 4.17*  HCT 34.1* 31.2*  PLT 163 157    Recent Labs  12/17/13 0550 12/18/13 0211  NA 136* 143  K 4.7 4.7  CL 102 104  CO2 21 27  BUN 12 12  CREATININE 1.02 1.15  GLUCOSE 170* 122*  CALCIUM 8.1* 8.4    Recent Labs  12/16/13 2012  INR 1.08    ABD soft Neurovascular intact Sensation intact distally Intact pulses distally Dorsiflexion/Plantar flexion intact Compartment soft Knee immobilizer in place No drainage noted through ace bandage  Assessment/Plan: 3 Days Post-Op Procedure(s) (LRB): INTRAMEDULLARY (IM) RETROGRADE FEMORAL NAILING (Right) COMPRESSION HIP (Right) Up with therapy Knee immobilizer at all times RLE NWB RLE Dry dressing change PRN Continue plan per trauma   ANTON, M. LINDSEY 12/19/2013, 11:25 AM
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
## 2013-12-19 NOTE — Progress Notes (Signed)
I participated in the care of this patient and agree with the above history, physical and evaluation. I performed a review of the history and a physical exam as detailed   Carilyn Woolston Daniel Aliese Brannum MD  

## 2013-12-20 LAB — CBC WITH DIFFERENTIAL/PLATELET
BASOS ABS: 0 10*3/uL (ref 0.0–0.1)
BASOS PCT: 0 % (ref 0–1)
EOS ABS: 0 10*3/uL (ref 0.0–0.7)
Eosinophils Relative: 0 % (ref 0–5)
HCT: 25.8 % — ABNORMAL LOW (ref 39.0–52.0)
Hemoglobin: 8.9 g/dL — ABNORMAL LOW (ref 13.0–17.0)
LYMPHS ABS: 1 10*3/uL (ref 0.7–4.0)
Lymphocytes Relative: 12 % (ref 12–46)
MCH: 26 pg (ref 26.0–34.0)
MCHC: 34.5 g/dL (ref 30.0–36.0)
MCV: 75.4 fL — ABNORMAL LOW (ref 78.0–100.0)
Monocytes Absolute: 0.6 10*3/uL (ref 0.1–1.0)
Monocytes Relative: 8 % (ref 3–12)
NEUTROS PCT: 80 % — AB (ref 43–77)
Neutro Abs: 6.7 10*3/uL (ref 1.7–7.7)
PLATELETS: 151 10*3/uL (ref 150–400)
RBC: 3.42 MIL/uL — AB (ref 4.22–5.81)
RDW: 15.3 % (ref 11.5–15.5)
WBC: 8.4 10*3/uL (ref 4.0–10.5)

## 2013-12-20 LAB — BASIC METABOLIC PANEL
BUN: 7 mg/dL (ref 6–23)
CO2: 27 mEq/L (ref 19–32)
Calcium: 8.3 mg/dL — ABNORMAL LOW (ref 8.4–10.5)
Chloride: 102 mEq/L (ref 96–112)
Creatinine, Ser: 0.98 mg/dL (ref 0.50–1.35)
GFR calc Af Amer: >90 mL/min (ref >90)
GFR calc non Af Amer: >90 mL/min (ref >90)
GLUCOSE: 99 mg/dL (ref 70–99)
Potassium: 3.6 mEq/L — ABNORMAL LOW (ref 3.7–5.3)
SODIUM: 141 meq/L (ref 137–147)

## 2013-12-20 LAB — TYPE AND SCREEN
ABO/RH(D): O POS
Antibody Screen: NEGATIVE
DONOR AG TYPE: NEGATIVE
DONOR AG TYPE: NEGATIVE
DONOR AG TYPE: NEGATIVE
Donor AG Type: NEGATIVE
UNIT DIVISION: 0
Unit division: 0
Unit division: 0
Unit division: 0
Unit division: 0
Unit division: 0
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
## 2013-12-21 ENCOUNTER — Inpatient Hospital Stay (HOSPITAL_COMMUNITY)
Admission: RE | Admit: 2013-12-21 | Discharge: 2014-01-11 | DRG: 945 | Disposition: A | Discharge status: Home Health | Payer: PRIVATE HEALTH INSURANCE | Source: Intra-hospital | Attending: Physical Medicine & Rehabilitation | Admitting: Physical Medicine & Rehabilitation

## 2013-12-21 ENCOUNTER — Encounter (HOSPITAL_COMMUNITY): Payer: Self-pay | Admitting: *Deleted

## 2013-12-21 ENCOUNTER — Encounter (HOSPITAL_BASED_OUTPATIENT_CLINIC_OR_DEPARTMENT_OTHER): Payer: Self-pay | Admitting: *Deleted

## 2013-12-21 DIAGNOSIS — D473 Essential (hemorrhagic) thrombocythemia: Secondary | ICD-10-CM | POA: Diagnosis present

## 2013-12-21 DIAGNOSIS — S064X9A Epidural hemorrhage with loss of consciousness of unspecified duration, initial encounter: Secondary | ICD-10-CM

## 2013-12-21 DIAGNOSIS — D57419 Sickle-cell thalassemia with crisis, unspecified: Secondary | ICD-10-CM | POA: Diagnosis present

## 2013-12-21 DIAGNOSIS — S069X9A Unspecified intracranial injury with loss of consciousness of unspecified duration, initial encounter: Secondary | ICD-10-CM | POA: Diagnosis present

## 2013-12-21 DIAGNOSIS — G936 Cerebral edema: Secondary | ICD-10-CM | POA: Diagnosis present

## 2013-12-21 DIAGNOSIS — IMO0002 Reserved for concepts with insufficient information to code with codable children: Secondary | ICD-10-CM | POA: Diagnosis present

## 2013-12-21 DIAGNOSIS — S069XAA Unspecified intracranial injury with loss of consciousness status unknown, initial encounter: Secondary | ICD-10-CM

## 2013-12-21 DIAGNOSIS — S82109A Unspecified fracture of upper end of unspecified tibia, initial encounter for closed fracture: Secondary | ICD-10-CM | POA: Diagnosis present

## 2013-12-21 DIAGNOSIS — E236 Other disorders of pituitary gland: Secondary | ICD-10-CM | POA: Diagnosis present

## 2013-12-21 DIAGNOSIS — K219 Gastro-esophageal reflux disease without esophagitis: Secondary | ICD-10-CM

## 2013-12-21 DIAGNOSIS — S72009A Fracture of unspecified part of neck of unspecified femur, initial encounter for closed fracture: Secondary | ICD-10-CM

## 2013-12-21 DIAGNOSIS — D62 Acute posthemorrhagic anemia: Secondary | ICD-10-CM | POA: Diagnosis present

## 2013-12-21 DIAGNOSIS — S065X9A Traumatic subdural hemorrhage with loss of consciousness of unspecified duration, initial encounter: Secondary | ICD-10-CM

## 2013-12-21 DIAGNOSIS — K5900 Constipation, unspecified: Secondary | ICD-10-CM | POA: Diagnosis present

## 2013-12-21 DIAGNOSIS — R4587 Impulsiveness: Secondary | ICD-10-CM | POA: Diagnosis present

## 2013-12-21 DIAGNOSIS — D571 Sickle-cell disease without crisis: Secondary | ICD-10-CM | POA: Diagnosis present

## 2013-12-21 DIAGNOSIS — S06309A Unspecified focal traumatic brain injury with loss of consciousness of unspecified duration, initial encounter: Secondary | ICD-10-CM

## 2013-12-21 DIAGNOSIS — S7291XA Unspecified fracture of right femur, initial encounter for closed fracture: Secondary | ICD-10-CM | POA: Diagnosis present

## 2013-12-21 DIAGNOSIS — Z832 Family history of diseases of the blood and blood-forming organs and certain disorders involving the immune mechanism: Secondary | ICD-10-CM

## 2013-12-21 DIAGNOSIS — S020XXA Fracture of vault of skull, initial encounter for closed fracture: Secondary | ICD-10-CM

## 2013-12-21 DIAGNOSIS — S83419A Sprain of medial collateral ligament of unspecified knee, initial encounter: Secondary | ICD-10-CM | POA: Diagnosis present

## 2013-12-21 DIAGNOSIS — S72309A Unspecified fracture of shaft of unspecified femur, initial encounter for closed fracture: Secondary | ICD-10-CM | POA: Diagnosis present

## 2013-12-21 DIAGNOSIS — F17200 Nicotine dependence, unspecified, uncomplicated: Secondary | ICD-10-CM | POA: Diagnosis present

## 2013-12-21 DIAGNOSIS — S02109A Fracture of base of skull, unspecified side, initial encounter for closed fracture: Secondary | ICD-10-CM | POA: Diagnosis present

## 2013-12-21 DIAGNOSIS — S42253A Displaced fracture of greater tuberosity of unspecified humerus, initial encounter for closed fracture: Secondary | ICD-10-CM | POA: Diagnosis present

## 2013-12-21 DIAGNOSIS — R112 Nausea with vomiting, unspecified: Secondary | ICD-10-CM

## 2013-12-21 DIAGNOSIS — S0291XA Unspecified fracture of skull, initial encounter for closed fracture: Secondary | ICD-10-CM | POA: Diagnosis present

## 2013-12-21 DIAGNOSIS — F3289 Other specified depressive episodes: Secondary | ICD-10-CM | POA: Diagnosis present

## 2013-12-21 DIAGNOSIS — Z79899 Other long term (current) drug therapy: Secondary | ICD-10-CM

## 2013-12-21 DIAGNOSIS — E876 Hypokalemia: Secondary | ICD-10-CM | POA: Diagnosis present

## 2013-12-21 DIAGNOSIS — F329 Major depressive disorder, single episode, unspecified: Secondary | ICD-10-CM | POA: Diagnosis present

## 2013-12-21 DIAGNOSIS — S0630AA Unspecified focal traumatic brain injury with loss of consciousness status unknown, initial encounter: Secondary | ICD-10-CM

## 2013-12-21 DIAGNOSIS — Z91199 Patient's noncompliance with other medical treatment and regimen due to unspecified reason: Secondary | ICD-10-CM

## 2013-12-21 DIAGNOSIS — D5700 Hb-SS disease with crisis, unspecified: Secondary | ICD-10-CM

## 2013-12-21 DIAGNOSIS — Z9119 Patient's noncompliance with other medical treatment and regimen: Secondary | ICD-10-CM

## 2013-12-21 DIAGNOSIS — Z720 Tobacco use: Secondary | ICD-10-CM

## 2013-12-21 DIAGNOSIS — S066X9A Traumatic subarachnoid hemorrhage with loss of consciousness of unspecified duration, initial encounter: Secondary | ICD-10-CM

## 2013-12-21 DIAGNOSIS — R4589 Other symptoms and signs involving emotional state: Secondary | ICD-10-CM | POA: Diagnosis present

## 2013-12-21 DIAGNOSIS — Z5189 Encounter for other specified aftercare: Principal | ICD-10-CM

## 2013-12-21 DIAGNOSIS — M25069 Hemarthrosis, unspecified knee: Secondary | ICD-10-CM | POA: Diagnosis present

## 2013-12-21 DIAGNOSIS — S72001A Fracture of unspecified part of neck of right femur, initial encounter for closed fracture: Secondary | ICD-10-CM | POA: Diagnosis present

## 2013-12-21 DIAGNOSIS — R471 Dysarthria and anarthria: Secondary | ICD-10-CM | POA: Diagnosis present

## 2013-12-21 DIAGNOSIS — E860 Dehydration: Secondary | ICD-10-CM

## 2013-12-21 MED ORDER — FLEET ENEMA 7-19 GM/118ML RE ENEM: 1.0000 | ENEMA | Freq: Once | RECTAL | Status: AC | PRN

## 2013-12-21 MED ORDER — PROCHLORPERAZINE EDISYLATE 5 MG/ML IJ SOLN
5.0000 mg | Freq: Four times a day (QID) | INTRAMUSCULAR | Status: DC | PRN
  Filled 2013-12-21: qty 2

## 2013-12-21 MED ORDER — PANTOPRAZOLE SODIUM 40 MG PO TBEC
40.0000 mg | DELAYED_RELEASE_TABLET | Freq: Every day | ORAL | Status: DC
  Administered 2013-12-22 – 2014-01-11 (×21): 40 mg via ORAL
  Filled 2013-12-21 (×17): qty 1

## 2013-12-21 MED ORDER — OXYCODONE HCL 5 MG PO TABS
5.0000 mg | ORAL_TABLET | ORAL | Status: DC | PRN
  Administered 2013-12-21 – 2013-12-25 (×12): 10 mg via ORAL
  Filled 2013-12-21 (×13): qty 2

## 2013-12-21 MED ORDER — POLYETHYLENE GLYCOL 3350 17 G PO PACK
17.0000 g | PACK | Freq: Every day | ORAL | Status: DC | PRN
  Administered 2013-12-21: 17 g via ORAL
  Filled 2013-12-21 (×2): qty 1

## 2013-12-21 MED ORDER — HYDROMORPHONE HCL PF 1 MG/ML IJ SOLN: 0.5000 mg | INTRAMUSCULAR | Status: DC | PRN

## 2013-12-21 MED ORDER — METHOCARBAMOL 500 MG PO TABS
500.0000 mg | ORAL_TABLET | Freq: Four times a day (QID) | ORAL | Status: DC | PRN
Start: 2013-12-21 — End: 2014-01-11
  Administered 2013-12-21 – 2014-01-07 (×21): 500 mg via ORAL
  Filled 2013-12-21 (×22): qty 1

## 2013-12-21 MED ORDER — TRAZODONE HCL 50 MG PO TABS
25.0000 mg | ORAL_TABLET | Freq: Every evening | ORAL | Status: DC | PRN
  Administered 2013-12-23 – 2013-12-27 (×5): 50 mg via ORAL
  Filled 2013-12-21 (×5): qty 1

## 2013-12-21 MED ORDER — NICOTINE 14 MG/24HR TD PT24
14.0000 mg | MEDICATED_PATCH | Freq: Every day | TRANSDERMAL | Status: DC
  Administered 2013-12-21 – 2014-01-08 (×9): 14 mg via TRANSDERMAL
  Filled 2013-12-21 (×23): qty 1

## 2013-12-21 MED ORDER — SODIUM CHLORIDE 0.9 % IJ SOLN: 3.0000 mL | INTRAMUSCULAR | Status: DC | PRN

## 2013-12-21 MED ORDER — DIPHENHYDRAMINE HCL 12.5 MG/5ML PO ELIX: 12.5000 mg | ORAL_SOLUTION | Freq: Four times a day (QID) | ORAL | Status: DC | PRN

## 2013-12-21 MED ORDER — BISACODYL 10 MG RE SUPP
10.0000 mg | Freq: Every day | RECTAL | Status: DC | PRN
  Administered 2013-12-22: 10 mg via RECTAL
  Filled 2013-12-21 (×3): qty 1

## 2013-12-21 MED ORDER — PROCHLORPERAZINE MALEATE 5 MG PO TABS
5.0000 mg | ORAL_TABLET | Freq: Four times a day (QID) | ORAL | Status: DC | PRN
  Administered 2013-12-22: 10 mg via ORAL
  Filled 2013-12-21 (×2): qty 2

## 2013-12-21 MED ORDER — GUAIFENESIN-DM 100-10 MG/5ML PO SYRP: 5.0000 mL | ORAL_SOLUTION | Freq: Four times a day (QID) | ORAL | Status: DC | PRN

## 2013-12-21 MED ORDER — ALUM & MAG HYDROXIDE-SIMETH 200-200-20 MG/5ML PO SUSP: 30.0000 mL | ORAL | Status: DC | PRN

## 2013-12-21 MED ORDER — PROCHLORPERAZINE 25 MG RE SUPP
12.5000 mg | Freq: Four times a day (QID) | RECTAL | Status: DC | PRN
  Filled 2013-12-21: qty 1

## 2013-12-21 MED ORDER — ACETAMINOPHEN 325 MG PO TABS
325.0000 mg | ORAL_TABLET | ORAL | Status: DC | PRN
  Administered 2013-12-23 – 2013-12-26 (×3): 650 mg via ORAL
  Administered 2013-12-29: 325 mg via ORAL
  Filled 2013-12-21: qty 1
  Filled 2013-12-21 (×3): qty 2
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
## 2013-12-21 NOTE — Progress Notes (Signed)
Trauma Service Note  Subjective: Patient is awake and alert.  No beds on 4N.  Patient stayed in the ICU  Objective: Vital signs in last 24 hours: Temp:  [97.9 F (36.6 C)-99.3 F (37.4 C)] 98.3 F (36.8 C) (02/09 0700) Pulse Rate:  [71-80] 80 (02/08 1000) Resp:  [13-27] 13 (02/09 0600) BP: (124-133)/(62-82) 133/67 mmHg (02/09 0600) SpO2:  [95 %-100 %] 95 % (02/08 1000)    Intake/Output from previous day: 02/08 0701 - 02/09 0700 In: 2790 [P.O.:1440; I.V.:1200; IV Piggyback:150] Out: 4145 [Urine:4145] Intake/Output this shift:    General: No acute distress.  Has headache  Lungs: Clear  Abd: Soft, nontender  Extremities: No changes.  Neuro: Intact, headache only, but oriented x 3.  Lab Results: CBC   Recent Labs  12/20/13 0300  WBC 8.4  HGB 8.9*  HCT 25.8*  PLT 151   BMET  Recent Labs  12/20/13 0300  NA 141  K 3.6*  CL 102  CO2 27  GLUCOSE 99  BUN 7  CREATININE 0.98  CALCIUM 8.3*   PT/INR No results found for this basename: LABPROT, INR,  in the last 72 hours ABG No results found for this basename: PHART, PCO2, PO2, HCO3,  in the last 72 hours  Studies/Results: No results found.  Anti-infectives: Anti-infectives   Start     Dose/Rate Route Frequency Ordered Stop   12/17/13 0400  ceFAZolin (ANCEF) IVPB 2 g/50 mL premix  Status:  Discontinued     2 g 100 mL/hr over 30 Minutes Intravenous Every 6 hours 12/17/13 0100 12/17/13 0305   12/17/13 0258  ceFAZolin (ANCEF) IVPB 1 g/50 mL premix     1 g 100 mL/hr over 30 Minutes Intravenous 3 times per day 12/17/13 0258     12/16/13 2015  ceFAZolin (ANCEF) 2 g in dextrose 5 % 50 mL IVPB     2 g 140 mL/hr over 30 Minutes Intravenous STAT 12/16/13 2011 12/16/13 2135      Assessment/Plan: s/p Procedure(s): INTRAMEDULLARY (IM) RETROGRADE FEMORAL NAILING COMPRESSION HIP Continue therapy. May be too high level  For Rehab Change transfer destination to 6N  LOS: 5 days   Kathryne Eriksson. Dahlia Bailiff, MD,  FACS [phone] Trauma Surgeon 12/21/2013
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
## 2013-12-21 NOTE — Progress Notes (Signed)
Rehab admissions - We met with pt, his mother and aunt and explained the possibility of inpatient rehab. Pt was sleepy throughout session (just received pain meds) and his mother and aunt were interested in pursuing inpatient rehab. Informational brochures given and questions answered. Confirmed that pt has Medicaid services associated with the sickle cell program. Will contact Medicaid to proceed with seeking authorization per sickle cell program. Will keep pt/family updated on this process. Please call with any questions. Thanks,  Nanetta Batty, [place] [facility] Admissions Coordinator [phone]

## 2013-12-21 NOTE — Progress Notes (Signed)
Patient discharged to inpatient rehab. Report called to [employer].
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
## 2013-12-21 NOTE — Progress Notes (Signed)
Rehab Admissions - Received update from Medicaid of [place] for sickle cell program. Per Adah Perl of [place] [facility] [phone]), this medicaid policy only covers events related to sickle cell disease. Per Charleston Ropes, this policy covers events related to sickle cell: 2 inpt stays/yr, 12 ER visits, up to 36 outpt visits in a 3 month period. Daisy checked with the [facility] medical director who stated if a hematolgist is involved in his current care and medical justification can be made for sickle cell involvement, then this case could be presented to [facility] Sickle Cell program for potential coverage.  Updated pt and his mother and gave this information to pt's mother. Pt/mother understand that they will be considered a self pay patient. In addition, pt's mother is interested in pursuing possible Medicaid application.  Pt and his mother are agreeable to pursue inpatient rehab and bed is available. Will admit pt to inpatient rehab later today. Please call with any questions. Thanks.  Nanetta Batty, PT Rehabilitation Admissions Coordinator [phone]

## 2013-12-21 NOTE — Progress Notes (Signed)
Received patient from 6N.  Arousable; oriented x4; vtials stable.  Patient and family oriented to room and unit.  Will continue to monitor.

## 2013-12-21 NOTE — Progress Notes (Signed)
Speech Language Pathology Treatment: Dysphagia;Cognitive-Linquistic  Patient Details Name: James Hamilton MRN: [id number] DOB: January 06, 1986 Today's Date: 12/21/2013 Time: 1500-1530 SLP Time Calculation (min): 30 min  Assessment / Plan / Recommendation Clinical Impression  Patient seen for f/u swallowing and cognitive treatment. Pt seen with trials of thin liquid via straw sips with no overt s/s of aspiration observed despite impulsivity with intake, requiring Max multimodal cues for small sips. Patient was lethargic but oriented x4. He required Min cues for sustained attention to functional conversation related to current situation. He did not recall that he had been approved for CIR transfer, however recalled general events from the day with Min cues from mother. He verbalized NWB status for RLE with supervision level question cueing. Continue plan of care.   HPI HPI: 28 yr old apparently with of Sickle cell.  CT Marked progression of hemorrhagic contusions in the right frontal lobe, left frontal lobe, and right temporal lobe. Large area hemorrhagic contusion is not present in the right frontal lobe.  Improvement in subdural hemorrhage and subarachnoid hemorrhage on the right. 5.7 mm midline shift to the left.  CXR Patchy infiltrate or contusion in the posterior left lower lobe.   Pertinent Vitals N/A  SLP Plan  Continue with current plan of care    Recommendations Diet recommendations: Regular;Thin liquid Liquids provided via: Cup;Straw Medication Administration: Whole meds with liquid Supervision: Patient able to self feed;Full supervision/cueing for compensatory strategies Compensations: Slow rate;Small sips/bites Postural Changes and/or Swallow Maneuvers: Seated upright 90 degrees              General recommendations: Rehab consult Oral Care Recommendations: Oral care BID Follow up Recommendations: Inpatient Rehab Plan: Continue with current plan of care    GO      Germain Osgood, M.A. CCC-SLP [phone]  Germain Osgood 12/21/2013, 3:45 PM

## 2013-12-21 NOTE — Progress Notes (Signed)
Patient ID: James Hamilton, male   DOB: Oct 23, 1986, 28 y.o.   MRN: [id number] Awakens oriented x2 follows commands continue physical occupational therapy as has been except for rehabilitation today

## 2013-12-22 ENCOUNTER — Inpatient Hospital Stay (HOSPITAL_COMMUNITY): Payer: Medicaid Other | Admitting: Occupational Therapy

## 2013-12-22 ENCOUNTER — Inpatient Hospital Stay (HOSPITAL_COMMUNITY): Payer: Medicaid Other | Admitting: Rehabilitation

## 2013-12-22 ENCOUNTER — Inpatient Hospital Stay (HOSPITAL_COMMUNITY): Payer: Medicaid Other | Admitting: Speech Pathology

## 2013-12-22 ENCOUNTER — Inpatient Hospital Stay (HOSPITAL_COMMUNITY): Payer: Medicaid Other | Admitting: *Deleted

## 2013-12-22 DIAGNOSIS — IMO0002 Reserved for concepts with insufficient information to code with codable children: Secondary | ICD-10-CM

## 2013-12-22 DIAGNOSIS — S06309A Unspecified focal traumatic brain injury with loss of consciousness of unspecified duration, initial encounter: Secondary | ICD-10-CM

## 2013-12-22 DIAGNOSIS — S72009A Fracture of unspecified part of neck of unspecified femur, initial encounter for closed fracture: Secondary | ICD-10-CM

## 2013-12-22 DIAGNOSIS — S020XXA Fracture of vault of skull, initial encounter for closed fracture: Secondary | ICD-10-CM

## 2013-12-22 DIAGNOSIS — S72309A Unspecified fracture of shaft of unspecified femur, initial encounter for closed fracture: Secondary | ICD-10-CM

## 2013-12-22 DIAGNOSIS — R609 Edema, unspecified: Secondary | ICD-10-CM

## 2013-12-22 LAB — CBC WITH DIFFERENTIAL/PLATELET
BASOS ABS: 0 10*3/uL (ref 0.0–0.1)
Basophils Relative: 0 % (ref 0–1)
Eosinophils Absolute: 0.1 10*3/uL (ref 0.0–0.7)
Eosinophils Relative: 1 % (ref 0–5)
HCT: 28.2 % — ABNORMAL LOW (ref 39.0–52.0)
Hemoglobin: 9.6 g/dL — ABNORMAL LOW (ref 13.0–17.0)
LYMPHS ABS: 1.5 10*3/uL (ref 0.7–4.0)
Lymphocytes Relative: 15 % (ref 12–46)
MCH: 25.9 pg — ABNORMAL LOW (ref 26.0–34.0)
MCHC: 34 g/dL (ref 30.0–36.0)
MCV: 76 fL — ABNORMAL LOW (ref 78.0–100.0)
Monocytes Absolute: 0.8 10*3/uL (ref 0.1–1.0)
Monocytes Relative: 8 % (ref 3–12)
NEUTROS ABS: 7.3 10*3/uL (ref 1.7–7.7)
Neutrophils Relative %: 75 % (ref 43–77)
PLATELETS: 225 10*3/uL (ref 150–400)
RBC: 3.71 MIL/uL — ABNORMAL LOW (ref 4.22–5.81)
RDW: 15.5 % (ref 11.5–15.5)
WBC: 9.8 10*3/uL (ref 4.0–10.5)

## 2013-12-22 LAB — URINE CULTURE
CULTURE: NO GROWTH
Colony Count: NO GROWTH

## 2013-12-22 LAB — COMPREHENSIVE METABOLIC PANEL
ALK PHOS: 64 U/L (ref 39–117)
ALT: 33 U/L (ref 0–53)
AST: 53 U/L — AB (ref 0–37)
Albumin: 3.1 g/dL — ABNORMAL LOW (ref 3.5–5.2)
BUN: 9 mg/dL (ref 6–23)
CHLORIDE: 104 meq/L (ref 96–112)
CO2: 28 meq/L (ref 19–32)
Calcium: 8.6 mg/dL (ref 8.4–10.5)
Creatinine, Ser: 0.86 mg/dL (ref 0.50–1.35)
GFR calc Af Amer: >90 mL/min (ref >90)
GFR calc non Af Amer: >90 mL/min (ref >90)
GLUCOSE: 92 mg/dL (ref 70–99)
POTASSIUM: 3.4 meq/L — AB (ref 3.7–5.3)
Sodium: 143 mEq/L (ref 137–147)
Total Bilirubin: 0.7 mg/dL (ref 0.3–1.2)
Total Protein: 6.5 g/dL (ref 6.0–8.3)

## 2013-12-22 MED ORDER — SORBITOL 70 % SOLN
45.0000 mL | Freq: Once | Status: AC
  Administered 2013-12-22: 45 mL via ORAL
  Filled 2013-12-22: qty 60

## 2013-12-22 MED ORDER — METHYLPHENIDATE HCL 5 MG PO TABS
5.0000 mg | ORAL_TABLET | Freq: Two times a day (BID) | ORAL | Status: DC
  Administered 2013-12-22 – 2013-12-30 (×16): 5 mg via ORAL
  Filled 2013-12-22 (×16): qty 1

## 2013-12-22 MED ORDER — POTASSIUM CHLORIDE CRYS ER 20 MEQ PO TBCR
20.0000 meq | EXTENDED_RELEASE_TABLET | Freq: Every day | ORAL | Status: DC
  Administered 2013-12-22 – 2014-01-11 (×21): 20 meq via ORAL
  Filled 2013-12-22 (×22): qty 1

## 2013-12-22 MED ORDER — SENNOSIDES-DOCUSATE SODIUM 8.6-50 MG PO TABS
3.0000 | ORAL_TABLET | Freq: Two times a day (BID) | ORAL | Status: DC
  Administered 2013-12-22 – 2014-01-11 (×41): 3 via ORAL
  Filled 2013-12-22 (×17): qty 3
  Filled 2013-12-22: qty 2
  Filled 2013-12-22 (×22): qty 3

## 2013-12-22 MED ORDER — FERROUS SULFATE 325 (65 FE) MG PO TABS
325.0000 mg | ORAL_TABLET | Freq: Two times a day (BID) | ORAL | Status: DC
  Administered 2013-12-22 – 2014-01-11 (×41): 325 mg via ORAL
  Filled 2013-12-22 (×43): qty 1

## 2013-12-22 NOTE — Progress Notes (Signed)
Physical Therapy Session Note  Patient Details  Name: James Hamilton MRN: [id number] Date of Birth: Mar 15, 1986  Today's Date: 12/22/2013 Time: 2706-2376 Time Calculation (min): 36 min  Short Term Goals: Week 1:  PT Short Term Goal 1 (Week 1): STGs=LTGs secondary to LOS  Skilled Therapeutic Interventions/Progress Updates:   Pt received sitting in w/c in room, mother present during first and last part of session today.  Pt states he feels that he needs to have a bowel movement, therefore set up w/c right outside of bathroom to ambulate with RW into restroom.  Placed pts RLE in orange theraband on RW in order to provide feedback for NWB through RLE while ambulating.  Ambulated into restroom at mod assist with mod to max verbal cues, esp when sitting/standing, to maintain NWB status on RLE.  Also provided cues for using grab bar when sitting/standing for increased safety.  Pt able to have bowel movement on toilet (RN notified).  Assisted pt with adjusting clothing prior to and following toileting and also assisted with peri care to ensure safety while pt maintained balance.  Pt then ambulated back to chair for seated rest break as stated above.  Pt somewhat impulsive to sit and requires mod verbal cues for backing all the way up to chair before sitting and reaching back.  Pt stating he wants to return to bed, therefore backed up w/c to doorway in order to continue gait training with RW to bedside.  Pt ambulated as stated above at mod assist.  Again requires cues for safety and backing up to bed.  Once in bed, again impulsive to lie down without therapist assist.  Finally had to assist pts RLE into bed with mod verbal and tactile cues for scooting hips to the L.  Performed supine hip abd (AAROM) x 10 reps and SLR x 10 reps RLE (AAROM).  Pt very fatigued at end of session, however therapist continued with attempts to engage pt by providing questioning cues of what happened to him, what he had worked on in  therapy that day, and who family members were in room.  Pt unable to remember what he worked on that day other than "worked on my leg" and "had a bowel movement."  Pt left in bed with family present with bed alarm set and all needs in reach.    Therapy Documentation Precautions:  Precautions Precautions: Fall Required Braces or Orthoses: Knee Immobilizer - Right Knee Immobilizer - Right: On at all times Restrictions Weight Bearing Restrictions: Yes RLE Weight Bearing: Non weight bearing General: Amount of Missed PT Time (min): 9 Minutes Missed Time Reason: Patient fatigue Vital Signs: Therapy Vitals Temp: 97.4 F (36.3 C) Temp src: Oral Pulse Rate: 83 Resp: 18 BP: 127/82 mmHg Patient Position, if appropriate: Lying Oxygen Therapy SpO2: 100 % O2 Device: None (Room air) Pain: Pt with no c/o pain during PT session.     See FIM for current functional status  Therapy/Group: Individual Therapy  Denice Bors 12/22/2013, 6:55 PM
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
## 2013-12-22 NOTE — Plan of Care (Signed)
Problem: RH BOWEL ELIMINATION Goal: RH STG MANAGE BOWEL WITH ASSISTANCE STG Manage Bowel with mod I.  Outcome: Not Progressing Last Bm reported 12/16/13, Sorbitol 80mL given and Senna 3 tablets, not yet effective.  Problem: RH SAFETY Goal: RH STG ADHERE TO SAFETY PRECAUTIONS W/ASSISTANCE/DEVICE STG Adhere to Safety Precautions With Mod Assistance/Device.  Outcome: Not Progressing Pt transferred from w/c to bed with quick release intact, poor safety awareness, requires max cues assist to maintain safety.

## 2013-12-22 NOTE — Progress Notes (Signed)
Patient information reviewed and entered into [facility] system by Deserai Cansler, RN, CRRN, PPS Coordinator.  Information including medical coding and functional independence measure will be reviewed and updated through discharge.    

## 2013-12-22 NOTE — Plan of Care (Signed)
Problem: RH PAIN MANAGEMENT Goal: RH STG PAIN MANAGED AT OR BELOW PT'S PAIN GOAL Pain <6 Outcome: Not Progressing Rates pain at 8 out of 10
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
## 2013-12-22 NOTE — Progress Notes (Signed)
Subjective/Complaints: No major issues last night. Remains quite distracted. Complains of headache. Pulled off KI A 12 point review of systems has been performed and if not noted above is otherwise negative.   Objective: Vital Signs: Blood pressure 137/72, pulse 71, temperature 98.3 F (36.8 C), temperature source Oral, resp. rate 16, SpO2 99.00%. No results found.  Recent Labs  12/20/13 0300 12/22/13 0459  WBC 8.4 9.8  HGB 8.9* 9.6*  HCT 25.8* 28.2*  PLT 151 225    Recent Labs  12/20/13 0300 12/22/13 0459  NA 141 143  K 3.6* 3.4*  CL 102 104  GLUCOSE 99 92  BUN 7 9  CREATININE 0.98 0.86  CALCIUM 8.3* 8.6   CBG (last 3)  No results found for this basename: GLUCAP,  in the last 72 hours  Wt Readings from Last 3 Encounters:  12/17/13 70.3 kg (154 lb 15.7 oz)  12/17/13 70.3 kg (154 lb 15.7 oz)  05/03/13 77.111 kg (170 lb)    Physical Exam:  General: Awake, a little restless in bed. Has taken off KI and has removed most of ACE wrap. HEENT: Head is normocephalic, atraumatic, PERRLA, EOMI, sclera anicteric, oral mucosa pink and moist, dentition intact, ext ear canals clear,  Neck: Supple without JVD or lymphadenopathy  Heart: Reg rate and rhythm. No murmurs rubs or gallops  Chest: CTA bilaterally without wheezes, rales, or rhonchi; no distress  Abdomen: Soft, non-tender, non-distended, bowel sounds positive.  Extremities: No clubbing, cyanosis, or edema. Pulses are 2+  Skin: Clean and intact without signs of breakdown. Right leg incisions all intact without drainage. Neuro: pt sitting in bed. Eyes open today, still with limited attention. Very distracted. Follows simple commands. Answers basic questions regarding living situation. Can tell me he's at [facility]. Moves all 4's except for right leg. Senses pain in all 4's. Left lid lag which improves a bit when he is more alert.  Musculoskeletal: right leg in KI with ACE wrap over lower portion of  leg.  Psych: Pt's affect is flat,    Assessment/Plan: 1. 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 Physiatrist is providing close team supervision and 24 hour management of active medical problems listed below. Physiatrist and rehab team continue to assess barriers to discharge/monitor patient progress toward functional and medical goals. FIM:                   Comprehension Comprehension Mode: Auditory Comprehension: 5-Follows basic conversation/direction: With no assist  Expression Expression Mode: Verbal  Social Interaction Social Interaction: 7-Interacts appropriately with others - No medications needed.  Problem Solving Problem Solving: 5-Solves basic problems: With no assist  Memory Memory: 7-Complete Independence: No helper  Medical Problem List and Plan:  TBI with skull fracture, left femural neck fracture with compression screw and left femoral shaft fracture with IM nail--NWB LLE.   -reviewed KI and precautions with patient although insight still limited 1. DVT Prophylaxis/Anticoagulation: Mechanical: Sequential compression devices, below knee Bilateral lower extremities  2. Pain Management: Has intermittent pain with sickle cell crises. Will continue oxycodone prn.  3. Mood: Internally distracted with poor insight and impaired awareness. Will monitor as mentation improves. LCSW to follow for evaluation and support.  -initiate ritalin trial  4. Neuropsych: This patient is not capable of making decisions on his own behalf.  5. Acute on chronic anemia/  H/o SS anemia : Hgb up to 9.6 .  6. Hypokalemia: 3.4, continue supplementation 7. Occult intra-articular fracture of the lateral tibial plateau with intercondylar extension and hemarthrosis and probable MCL strain: Continue NWB. Will check on ROM/KI restrictions from ortho.  8.  Constipation: Will augment bowel program  LOS (Days) 1 A FACE TO FACE EVALUATION WAS PERFORMED  James Hamilton T 12/22/2013 8:20 AM

## 2013-12-22 NOTE — Progress Notes (Signed)
Occupational Therapy Session Note  Patient Details  Name: James Hamilton MRN: [id number] Date of Birth: 01/07/1986  Today's Date: 12/22/2013 Time: 8270-7867 Time Calculation (min): 45 min  Short Term Goals: Week 1:  OT Short Term Goal 1 (Week 1): pt will demonstrate sustain attention for 1 minute to adl task OT Short Term Goal 2 (Week 1): Pt will demonstrate bed mobility HOB flat with min (A) OT Short Term Goal 3 (Week 1): Pt will complete toilet transfer Min (A)  OT Short Term Goal 4 (Week 1): pt will complete grooming task min verbal cues and min (A)  Skilled Therapeutic Interventions/Progress Updates:    1:1 self care/ basic transfer/ static standing: pt supine on arrival with parents present. Pt with pants doff in supine. Pt total (A) to don ted hose.Pt total (A) to thread Rt LE and pt don Lt LE into pants. Pt able to bridge buttock off bed with LT LE. Pt progressed to eob with min (A) . Pt reports dizziness at eob and educated on gaze stabilization. Pt transferring with RW from EOB to sink level with mod (A). Pt LOB to the right side x2 during session. Pt lifting left side of RW off ground with LOB to the right. Pt static standing at sink for hand washing min (A). Pt returned to sitting for lunch. Pt needed cues to initiate. Pt eating sweet potato, ice cream and red velvet cake. Pt in w/c with gait belt. RN reports pt transferred independently from w/c to bed earlier this AM. Pt provided call bell, door open and talking on telephone at end of session. Pt oriented x4, recalling all injuries and reason for admission. Pt self reports difficulty with memory. [place] recovery level VI   Therapy Documentation Precautions:  Precautions Precautions: Fall Required Braces or Orthoses: Knee Immobilizer - Right Knee Immobilizer - Right: On at all times Restrictions Weight Bearing Restrictions: Yes RLE Weight Bearing: Non weight bearing Pain:  4 out 10 pain- HA 6 out 10 pain- RT LE        See FIM for current functional status  Therapy/Group: Individual Therapy  Peri Maris 12/22/2013, 3:06 PM Pager: [phone]

## 2013-12-23 ENCOUNTER — Inpatient Hospital Stay (HOSPITAL_COMMUNITY): Payer: Medicaid Other | Admitting: Speech Pathology

## 2013-12-23 ENCOUNTER — Inpatient Hospital Stay (HOSPITAL_COMMUNITY): Payer: Medicaid Other | Admitting: Occupational Therapy

## 2013-12-23 ENCOUNTER — Inpatient Hospital Stay (HOSPITAL_COMMUNITY): Payer: Medicaid Other | Admitting: *Deleted

## 2013-12-23 DIAGNOSIS — S72309A Unspecified fracture of shaft of unspecified femur, initial encounter for closed fracture: Secondary | ICD-10-CM

## 2013-12-23 DIAGNOSIS — IMO0002 Reserved for concepts with insufficient information to code with codable children: Secondary | ICD-10-CM

## 2013-12-23 DIAGNOSIS — S0630AA Unspecified focal traumatic brain injury with loss of consciousness status unknown, initial encounter: Secondary | ICD-10-CM

## 2013-12-23 DIAGNOSIS — S020XXA Fracture of vault of skull, initial encounter for closed fracture: Secondary | ICD-10-CM

## 2013-12-23 DIAGNOSIS — S06309A Unspecified focal traumatic brain injury with loss of consciousness of unspecified duration, initial encounter: Secondary | ICD-10-CM

## 2013-12-23 DIAGNOSIS — S72009A Fracture of unspecified part of neck of unspecified femur, initial encounter for closed fracture: Secondary | ICD-10-CM
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
## 2013-12-23 NOTE — Progress Notes (Signed)
OT NOTE  Pt is okay to perform shower while seated per verbal order from PA PAM .Pt is to remain NWB RT LE   Jeri Modena   OTR/L Pager: [phone] Office: [phone] .

## 2013-12-23 NOTE — Progress Notes (Signed)
Occupational Therapy Session Note  Patient Details  Name: James Hamilton MRN: [id number] Date of Birth: Nov 30, 1985  Today's Date: 12/23/2013 Time: 0700-0750 Time Calculation (min): 50 min  Short Term Goals: Week 1:  OT Short Term Goal 1 (Week 1): pt will demonstrate sustain attention for 1 minute to adl task OT Short Term Goal 2 (Week 1): Pt will demonstrate bed mobility HOB flat with min (A) OT Short Term Goal 3 (Week 1): Pt will complete toilet transfer Min (A)  OT Short Term Goal 4 (Week 1): pt will complete grooming task min verbal cues and min (A)  Skilled Therapeutic Interventions/Progress Updates:   1:1 self care retraining/ basic transfer: pt required 15 minutes to exit bed this AM. Pt with name call and tactile cues to initiate arousal. Pt pulling covers over his head. Pt with all lights turned on, window opened, HOB elevated and multimodal cues to progress to EOB sitting. Pt impulsive and transferring with squat pivot to w/c using UB. Pt educated on the need to use RW for safety. Pt positioned at sink with basin full of water and wash cloth present. Pt needed cues to initiate. Pt terminating task prematurely and needed cues for attention to detail. Pt attempting to push with BIL LE to bridge in w/c to pull up shorts / boxers. Pt educated on Rt LE NWB. Pt states "i know"  Pt's calendar in room states "going home" from previous patient writing on calendar. Pt asking therapist "are you going home today" Pt educated that previous patient worked hard and actually went home early. Pt states "I dont need that I am home". Pt perseverating on returning to bed. Pt performing oral care in detail. Pt educated on schedule and reading schedule allowed to therapist. Pt using RW to transfer from w/c to bed and educated on getting OOB for SLP at 8am. Pt agreeable.   Therapy Documentation Precautions:  Precautions Precautions: Fall Required Braces or Orthoses: Knee Immobilizer - Right Knee  Immobilizer - Right: On at all times Restrictions Weight Bearing Restrictions: Yes RLE Weight Bearing: Non weight bearing General: General Amount of Missed OT Time (min): 10 Minutes Vital Signs: Therapy Vitals Temp: 97.7 F (36.5 C) Temp src: Oral Pulse Rate: 66 Resp: 16 BP: 133/68 mmHg Patient Position, if appropriate: Lying Oxygen Therapy SpO2: 100 % O2 Device: None (Room air) Pain: Pain Assessment 6 out 10 HA 8 out 10 RT LE pain  ADL:      See FIM for current functional status  Therapy/Group: Individual Therapy  Peri Maris 12/23/2013, 7:54 AM Pager: [phone]

## 2013-12-23 NOTE — Progress Notes (Signed)
Speech Language Pathology Daily Session Note  Patient Details  Name: James Hamilton MRN: [id number] Date of Birth: [birth year]  Today's Date: 12/23/2013 Time: 1330-1415 Time Calculation (min): 45 min  Short Term Goals: Week 1: SLP Short Term Goal 1 (Week 1): Pt will utilize call bell to request assistance with Mod question cues.  SLP Short Term Goal 2 (Week 1): Pt will demonstrate sustained attention to a functional task for 15 minutes with Mod A verbal cues for redirection  SLP Short Term Goal 3 (Week 1): Pt will recall new, daily information with Mod A multimodal cueing  SLP Short Term Goal 4 (Week 1): Pt will identify 2 cognitive impairments with Min question cues.  SLP Short Term Goal 5 (Week 1): Pt will utilize an increased vocal intensity at the phrase level with Min verbal cues.  SLP Short Term Goal 6 (Week 1): Pt will consume current diet with minimal overt s/s of aspiration with supervision verbal cues.   Skilled Therapeutic Interventions: Treatment focus on dysphagia and cognitive goals. Upon arrival, pt was asleep supine in bed. Pt transferred to wheelchair with extra time and Max verbal and tactile cues for initiation. SLP facilitated session by providing Min A verbal and visual cues for utilization of small bites and a slow pace with lunch meal of regular textures and thin liquids via straw. Pt without overt s/s of aspiration.  Pt also required Min A question cues for functional problem solving for pain management.  Pt also required Max verbal and question cues for social engagement and interaction with clinician. Continue with current plan of care.    FIM:  Comprehension Comprehension Mode: Auditory Comprehension: 4-Understands basic 75 - 89% of the time/requires cueing 10 - 24% of the time Expression Expression Mode: Verbal Expression: 4-Expresses basic 75 - 89% of the time/requires cueing 10 - 24% of the time. Needs helper to occlude trach/needs to repeat words. Social  Interaction Social Interaction: 3-Interacts appropriately 50 - 74% of the time - May be physically or verbally inappropriate. Problem Solving Problem Solving: 4-Solves basic 75 - 89% of the time/requires cueing 10 - 24% of the time Memory Memory: 2-Recognizes or recalls 25 - 49% of the time/requires cueing 51 - 75% of the time FIM - Eating Eating Activity: 5: Supervision/cues  Pain Pain Assessment Pain Assessment: 0-10 Pain Score: 7/10 in right arm and 9/10 for back Multiple Pain Sites: Yes RN made aware, medications given and pt repositioned   Therapy/Group: Individual Therapy  Shelitha Magley 12/23/2013, 3:40 PM
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
## 2013-12-23 NOTE — Progress Notes (Signed)
Social Work Patient ID: James Hamilton, male   DOB: [birth year], 28 y.o.   MRN: [id number]  Met with pt and both parents yesterday afternoon following team conference.  All aware and agreeable with targeted d/c date of 2/20 with supervision goals.  Parents pleased with progress to date.  Also discussed possible start of a SSD application - all agreeable so will begin process.  Continue to follow for support.  Request for neuropsych consult for testing initiated.  Shajuana Mclucas, LCSW

## 2013-12-23 NOTE — Progress Notes (Signed)
Subjective/Complaints: No new issues. Just waking up. A 12 point review of systems has been performed and if not noted above is otherwise negative.   Objective: Vital Signs: Blood pressure 133/68, pulse 66, temperature 97.7 F (36.5 C), temperature source Oral, resp. rate 16, weight 66.497 kg (146 lb 9.6 oz), SpO2 100.00%. No results found.  Recent Labs  12/22/13 0459  WBC 9.8  HGB 9.6*  HCT 28.2*  PLT 225    Recent Labs  12/22/13 0459  NA 143  K 3.4*  CL 104  GLUCOSE 92  BUN 9  CREATININE 0.86  CALCIUM 8.6   CBG (last 3)  No results found for this basename: GLUCAP,  in the last 72 hours  Wt Readings from Last 3 Encounters:  12/23/13 66.497 kg (146 lb 9.6 oz)  12/17/13 70.3 kg (154 lb 15.7 oz)  12/17/13 70.3 kg (154 lb 15.7 oz)    Physical Exam:  General: Awake, a little restless in bed. Has taken off KI and has removed most of ACE wrap. HEENT: Head is normocephalic, atraumatic, PERRLA, EOMI, sclera anicteric, oral mucosa pink and moist, dentition intact, ext ear canals clear,  Neck: Supple without JVD or lymphadenopathy  Heart: Reg rate and rhythm. No murmurs rubs or gallops  Chest: CTA bilaterally without wheezes, rales, or rhonchi; no distress  Abdomen: Soft, non-tender, non-distended, bowel sounds positive.  Extremities: No clubbing, cyanosis, or edema. Pulses are 2+  Skin: Clean and intact without signs of breakdown. Right leg incisions all intact without drainage. Neuro: pt sitting in bed. Eyes open today, still with limited attention. Very distracted. Follows simple commands. Answers basic questions regarding living situation. Can tell me he's at [facility]. Moves all 4's except for right leg. Senses pain in all 4's.  Musculoskeletal: right leg in KI with ACE wrap over lower portion of leg.  Psych: Pt's affect is flat,    Assessment/Plan: 1. Functional deficits secondary to TBI, right femur,tibial plateau fx's which require 3+ hours  per day of interdisciplinary therapy in a comprehensive inpatient rehab setting. Physiatrist is providing close team supervision and 24 hour management of active medical problems listed below. Physiatrist and rehab team continue to assess barriers to discharge/monitor patient progress toward functional and medical goals.  FIM: FIM - Bathing Bathing Steps Patient Completed: Chest;Right Arm;Left Arm;Abdomen;Front perineal area;Buttocks;Right upper leg;Left upper leg;Left lower leg (including foot) Bathing: 3: Mod-Patient completes 5-7 29f 10 parts or 50-74%  FIM - Upper Body Dressing/Undressing Upper body dressing/undressing steps patient completed: Thread/unthread right sleeve of pullover shirt/dresss;Thread/unthread left sleeve of pullover shirt/dress;Put head through opening of pull over shirt/dress;Pull shirt over trunk Upper body dressing/undressing: 5: Supervision: Safety issues/verbal cues FIM - Lower Body Dressing/Undressing Lower body dressing/undressing steps patient completed: Thread/unthread right pants leg;Thread/unthread left pants leg;Pull pants up/down;Thread/unthread right underwear leg;Thread/unthread left underwear leg;Pull underwear up/down;Fasten/unfasten pants;Don/Doff right sock;Don/Doff left sock Lower body dressing/undressing: 2: Max-Patient completed 25-49% of tasks     FIM - Radio producer Devices: Walker;Elevated toilet seat;Grab bars Toilet Transfers: 3-To toilet/BSC: Mod A (lift or lower assist);3-From toilet/BSC: Mod A (lift or lower assist)  FIM - Bed/Chair Transfer Bed/Chair Transfer Assistive Devices: Bed rails;Arm rests;HOB elevated Bed/Chair Transfer: 4: Sit > Supine: Min A (steadying pt. > 75%/lift 1 leg)  FIM - Locomotion: Wheelchair Distance: 45 Locomotion: Wheelchair: 1: Travels less than 50 ft with minimal assistance (Pt.>75%) FIM - Locomotion: Ambulation Locomotion: Ambulation Assistive Devices: Astronomer  Ambulation/Gait Assistance: 1: +2 Total assist;3: Mod assist Locomotion: Ambulation: 1: Two helpers  Comprehension Comprehension Mode: Auditory Comprehension: 4-Understands basic 75 - 89% of the time/requires cueing 10 - 24% of the time  Expression Expression Mode: Verbal Expression: 2-Expresses basic 25 - 49% of the time/requires cueing 50 - 75% of the time. Uses single words/gestures.  Social Interaction Social Interaction: 2-Interacts appropriately 25 - 49% of time - Needs frequent redirection.  Problem Solving Problem Solving: 2-Solves basic 25 - 49% of the time - needs direction more than half the time to initiate, plan or complete simple activities  Memory Memory: 2-Recognizes or recalls 25 - 49% of the time/requires cueing 51 - 75% of the time  Medical Problem List and Plan:  TBI with skull fracture, left femural neck fracture with compression screw and left femoral shaft fracture with IM nail--NWB LLE.     1. DVT Prophylaxis/Anticoagulation: Mechanical: Sequential compression devices, below knee Bilateral lower extremities  2. Pain Management: Has intermittent pain with sickle cell crises. Will continue oxycodone prn.  3. Mood: Internally distracted with poor insight and impaired awareness. Will monitor as mentation improves. LCSW to follow for evaluation and support.  - ritalin trial  4. Neuropsych: This patient is not capable of making decisions on his own behalf.  5. Acute on chronic anemia/ H/o SS anemia : Hgb up to 9.6 .  6. Hypokalemia: 3.4, continue supplementation 7. Occult intra-articular fracture of the lateral tibial plateau with intercondylar extension and hemarthrosis and probable MCL strain: Continue NWB, KI.  8. Constipation: Will augment bowel program  LOS (Days) 2 A FACE TO FACE EVALUATION WAS PERFORMED  Shontae Rosiles T 12/23/2013 8:19 AM

## 2013-12-23 NOTE — Progress Notes (Signed)
Physical Therapy Session Note  Patient Details  Name: James Hamilton MRN: [id number] Date of Birth: 14-Mar-1986  Today's Date: 12/23/2013 Time: 1103-1200 and 1415-154 Time Calculation (min): 57 min and 39 min  Short Term Goals: Week 1:  PT Short Term Goal 1 (Week 1): STGs=LTGs secondary to LOS  Skilled Therapeutic Interventions/Progress Updates:    AM Session: Patient received semi-reclined in bed. Session focused on cognitive remediation and safety awareness with functional transfers, gait training, and wheelchair mobility. Patient oriented x4. Patient requires maxA for sit>supine with HOB elevated and use of bedrails secondary to lethargy and decreased initiation. Patient impulsively attempting to transfer without assist and requires verbal directions and hands on assist to stop for safety. Stand pivot transfer with RW (with theraband sling for R LE to maintain NWB precautions) with modA, verbal cues for sequencing and technique. Squat pivot transfer wheelchair>mat with modA, patient demonstrating significant posterior trunk lean in unsupported sitting on EOM. Patient unable to maintain upright posture in unsupported sitting, so transitioned to long sitting on mat with pillows for back support. Patient completed pipe tree activity of medium difficulty with min verbal cues for accuracy, problem solving, and sequencing. Patient demonstrating good self-correction with questioning cues. Gait training 48' x1 and 20' x1 in controlled environment with RW and theraband sling and modA. Verbal cues for maintaining BOS within RW, but not too far anterior. Patient left supine in bed with 3 rails up and bed alarm on, all needs within reach.  PM Session: Patient received sitting in wheelchair. Session focused on functional transfers, postural control w/ dynamic sitting balance, and gait training. Wheelchair mobility x75' with B UE and supervision, able to negotiate obstacles in confined spaces. Squat pivot  transfers with modA secondary to poor postural control and significant posterior trunk lean in unsupported sitting. Sitting unsupported for horseshoe toss, patient required to laterally lean to reach for horseshoes, requires assist to maintain upright sitting, sits with significant posterior pelvic tilt. Functional ambulation from door of room>bed with modA, cues for maintaining R LE NWB. Patient left supine in bed with all needs within reach and bed alarm on.  Therapy Documentation Precautions:  Precautions Precautions: Fall Precaution Comments: cognitive deficits Required Braces or Orthoses: Knee Immobilizer - Right Knee Immobilizer - Right: On at all times Restrictions Weight Bearing Restrictions: Yes RLE Weight Bearing: Non weight bearing Pain: Pain Assessment Pain Assessment: 0-10 Pain Score: 6  Pain Type: Surgical pain Pain Location: Leg Pain Orientation: Right Pain Descriptors / Indicators: Spasm Pain Onset: Gradual Pain Intervention(s): RN made aware;Repositioned;Ambulation/increased activity Multiple Pain Sites: No Locomotion : Ambulation Ambulation/Gait Assistance: 3: Mod assist Wheelchair Mobility Distance: 100   See FIM for current functional status  Therapy/Group: Individual Therapy  Lillia Abed. Cherron Blitzer, PT, DPT 12/23/2013, 12:10 PM
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
## 2013-12-24 ENCOUNTER — Inpatient Hospital Stay (HOSPITAL_COMMUNITY): Payer: Medicaid Other | Admitting: *Deleted

## 2013-12-24 ENCOUNTER — Inpatient Hospital Stay (HOSPITAL_COMMUNITY): Payer: Medicaid Other | Admitting: Speech Pathology

## 2013-12-24 ENCOUNTER — Encounter (HOSPITAL_COMMUNITY): Payer: PRIVATE HEALTH INSURANCE | Admitting: Occupational Therapy

## 2013-12-24 ENCOUNTER — Inpatient Hospital Stay (HOSPITAL_COMMUNITY): Payer: Medicaid Other

## 2013-12-24 NOTE — Progress Notes (Signed)
Speech Language Pathology Daily Session Notes  Patient Details  Name: James Hamilton MRN: [id number] Date of Birth: Oct 11, 1986  Today's Date: 12/24/2013  Session 1 Time: 0800-0855 Time Calculation (min): 55 min  Session 2 Time: 1330-1350 Time Calculation: 20 min  Short Term Goals: Week 1: SLP Short Term Goal 1 (Week 1): Pt will utilize call bell to request assistance with Mod question cues.  SLP Short Term Goal 2 (Week 1): Pt will demonstrate sustained attention to a functional task for 15 minutes with Mod A verbal cues for redirection  SLP Short Term Goal 3 (Week 1): Pt will recall new, daily information with Mod A multimodal cueing  SLP Short Term Goal 4 (Week 1): Pt will identify 2 cognitive impairments with Min question cues.  SLP Short Term Goal 5 (Week 1): Pt will utilize an increased vocal intensity at the phrase level with Min verbal cues.  SLP Short Term Goal 6 (Week 1): Pt will consume current diet with minimal overt s/s of aspiration with supervision verbal cues.   Skilled Therapeutic Interventions:  Session 1: Treatment focus on dysphagia and cognitive goals. Upon arrival, pt was supine in recliner. Pt required Max verbal cues for appropriate positioning during PO intake and was eventually transferred to his wheelchair to increase overall arousal. Pt consumed regular textures with thin liquids without overt s/s of aspiration but required Mod verbal and visual cues for utilization of small bites and a slow pace. Pt demonstrated functional problem solving with  basic computer tasks but required extra time and Mod A verbal and question cues for functional problem solving with a mildly complex, new learning task. Pt required constant cueing throughout the session for participation and arousal due to pt perseverative in going back to bed. Pt transferred back to recliner at end of session to encourage staying out of bed. Continue with current plan of care   Session 2: Treatment focus  on cognitive goals. Upon arrival, pt was sleeping in bed. Pt required total multimodal cueing for encouragement for participation in treatment session. Pt became mildly verbally agitated when blankets were removed and reported, "you don't want to do that." Pt reported he was going home today and proceeded to call his mother to request her to come pick him up. Unsure of what mother said during phone conversation, however, after he hung up, he reported his mother was picking him up after work today, therefore, he didn't need to pariticpate. Social worker made aware. Session ended 25 minutes early. Continue with current plan of care.   FIM:  Comprehension Comprehension Mode: Auditory Comprehension: 4-Understands basic 75 - 89% of the time/requires cueing 10 - 24% of the time Expression Expression Mode: Verbal Expression: 4-Expresses basic 75 - 89% of the time/requires cueing 10 - 24% of the time. Needs helper to occlude trach/needs to repeat words. Social Interaction Social Interaction: 3-Interacts appropriately 50 - 74% of the time - May be physically or verbally inappropriate. Problem Solving Problem Solving: 4-Solves basic 75 - 89% of the time/requires cueing 10 - 24% of the time Memory Memory: 2-Recognizes or recalls 25 - 49% of the time/requires cueing 51 - 75% of the time FIM - Eating Eating Activity: 5: Supervision/cues  Pain Pain Assessment Pain Assessment: 0-10 Pain Score: 8  Pain Type: Acute pain Pain Location: Back Pain Orientation: Mid Pain Radiating Towards: right leg  Pain Descriptors / Indicators: Aching Pain Frequency: Occasional Pain Onset: Gradual Patients Stated Pain Goal: 3 Pain Intervention(s): Medication (See eMAR) (oxycodone 10 mg  po)  Therapy/Group: Individual Therapy  Etsuko Dierolf 12/24/2013, 3:29 PM

## 2013-12-24 NOTE — Progress Notes (Signed)
Subjective/Complaints: Up in shower already. No new problems reported. Pain issues still A 12 point review of systems has been performed and if not noted above is otherwise negative.   Objective: Vital Signs: Blood pressure 121/74, pulse 89, temperature 98.7 F (37.1 C), temperature source Oral, resp. rate 22, weight 66.497 kg (146 lb 9.6 oz), SpO2 98.00%. No results found.  Recent Labs  12/22/13 0459  WBC 9.8  HGB 9.6*  HCT 28.2*  PLT 225    Recent Labs  12/22/13 0459  NA 143  K 3.4*  CL 104  GLUCOSE 92  BUN 9  CREATININE 0.86  CALCIUM 8.6   CBG (last 3)  No results found for this basename: GLUCAP,  in the last 72 hours  Wt Readings from Last 3 Encounters:  12/23/13 66.497 kg (146 lb 9.6 oz)  12/17/13 70.3 kg (154 lb 15.7 oz)  12/17/13 70.3 kg (154 lb 15.7 oz)    Physical Exam:  General: Awake, a little restless in bed. Has taken off KI and has removed most of ACE wrap. HEENT: Head is normocephalic, atraumatic, PERRLA, EOMI, sclera anicteric, oral mucosa pink and moist, dentition intact, ext ear canals clear,  Neck: Supple without JVD or lymphadenopathy  Heart: Reg rate and rhythm. No murmurs rubs or gallops  Chest: CTA bilaterally without wheezes, rales, or rhonchi; no distress  Abdomen: Soft, non-tender, non-distended, bowel sounds positive.  Extremities: No clubbing, cyanosis, or edema. Pulses are 2+  Skin: Clean and intact without signs of breakdown. Right leg incisions all intact without drainage. Neuro: more alert, still distracted.   Answers basic questions regarding living situation.   Moves all 4's except for right leg. Senses pain in all 4's.  Musculoskeletal: right leg in KI with ACE wrap over lower portion of leg.  Psych: Pt's affect is flat,    Assessment/Plan: 1. Functional deficits secondary to TBI, right femur,tibial plateau fx's which require 3+ hours per day of interdisciplinary therapy in a comprehensive inpatient rehab  setting. Physiatrist is providing close team supervision and 24 hour management of active medical problems listed below. Physiatrist and rehab team continue to assess barriers to discharge/monitor patient progress toward functional and medical goals.  FIM: FIM - Bathing Bathing Steps Patient Completed: Chest;Right Arm;Left Arm;Abdomen;Front perineal area;Buttocks;Right upper leg;Left upper leg;Left lower leg (including foot) Bathing: 3: Mod-Patient completes 5-7 33f 10 parts or 50-74%  FIM - Upper Body Dressing/Undressing Upper body dressing/undressing steps patient completed: Thread/unthread right sleeve of pullover shirt/dresss;Thread/unthread left sleeve of pullover shirt/dress;Put head through opening of pull over shirt/dress;Pull shirt over trunk Upper body dressing/undressing: 5: Supervision: Safety issues/verbal cues FIM - Lower Body Dressing/Undressing Lower body dressing/undressing steps patient completed: Thread/unthread right pants leg;Thread/unthread left pants leg;Pull pants up/down;Thread/unthread right underwear leg;Thread/unthread left underwear leg;Pull underwear up/down;Fasten/unfasten pants;Don/Doff right sock;Don/Doff left sock Lower body dressing/undressing: 2: Max-Patient completed 25-49% of tasks     FIM - Radio producer Devices: Walker;Elevated toilet seat;Grab bars Toilet Transfers: 3-To toilet/BSC: Mod A (lift or lower assist);3-From toilet/BSC: Mod A (lift or lower assist)  FIM - Bed/Chair Transfer Bed/Chair Transfer Assistive Devices: Bed rails;Arm rests Bed/Chair Transfer: 2: Supine > Sit: Max A (lifting assist/Pt. 25-49%);3: Chair or W/C > Bed: Mod A (lift or lower assist);3: Bed > Chair or W/C: Mod A (lift or lower assist);4: Sit > Supine: Min A (steadying pt. > 75%/lift 1 leg)  FIM - Locomotion: Wheelchair Distance: 100 Locomotion: Wheelchair: 2: Travels 50 -  149 ft with supervision, cueing or coaxing FIM - Locomotion:  Ambulation Locomotion: Ambulation Assistive Devices: Walker - Rolling Ambulation/Gait Assistance: 3: Mod assist Locomotion: Ambulation: 1: Travels less than 50 ft with moderate assistance (Pt: 50 - 74%)  Comprehension Comprehension Mode: Auditory Comprehension: 4-Understands basic 75 - 89% of the time/requires cueing 10 - 24% of the time  Expression Expression Mode: Verbal Expression: 4-Expresses basic 75 - 89% of the time/requires cueing 10 - 24% of the time. Needs helper to occlude trach/needs to repeat words.  Social Interaction Social Interaction: 3-Interacts appropriately 50 - 74% of the time - May be physically or verbally inappropriate.  Problem Solving Problem Solving: 4-Solves basic 75 - 89% of the time/requires cueing 10 - 24% of the time  Memory Memory: 2-Recognizes or recalls 25 - 49% of the time/requires cueing 51 - 75% of the time  Medical Problem List and Plan:  TBI with skull fracture, left femural neck fracture with compression screw and left femoral shaft fracture with IM nail--NWB LLE.     1. DVT Prophylaxis/Anticoagulation: Mechanical: Sequential compression devices, below knee Bilateral lower extremities  2. Pain Management: Has intermittent pain with sickle cell crises. Will continue oxycodone prn.  3. Mood: Internally distracted with poor insight and impaired awareness. Will monitor as mentation improves. LCSW to follow for evaluation and support.  - ritalin trial  4. Neuropsych: This patient is not capable of making decisions on his own behalf.  5. Acute on chronic anemia/ H/o SS anemia : Hgb up to 9.6 .  6. Hypokalemia: 3.4, continue supplementation 7. Occult intra-articular fracture of the lateral tibial plateau with intercondylar extension and hemarthrosis and probable MCL strain: Continue NWB, KI.  8. Constipation:  bm's yesterday  LOS (Days) 3 A FACE TO FACE EVALUATION WAS PERFORMED  SWARTZ,ZACHARY T 12/24/2013 8:52 AM

## 2013-12-24 NOTE — Progress Notes (Signed)
Occupational Therapy Session Note  Patient Details  Name: James Hamilton MRN: [id number] Date of Birth: 1986/07/05  Today's Date: 12/24/2013 Time: 7:00-8:00 Time Calculation (min):60 min  Short Term Goals: Week 1:  OT Short Term Goal 1 (Week 1): pt will demonstrate sustain attention for 1 minute to adl task OT Short Term Goal 2 (Week 1): Pt will demonstrate bed mobility HOB flat with min (A) OT Short Term Goal 3 (Week 1): Pt will complete toilet transfer Min (A)  OT Short Term Goal 4 (Week 1): pt will complete grooming task min verbal cues and min (A)  Skilled Therapeutic Interventions/Progress Updates:    1:1 self care retraining/ cognitive recovery : pt supine on arrival and required 15 minutes to progress to EOB. Pt asking for extended time supine and attempting to bargain with therapist about delaying treatment. Pt insisting on transferring without RW to w/c. Pt with RW placed in front of patient and educated on safety. Pt pushing RW out of the way and using BIL UE to pivot to w/c. Therapist holding Rt LE to prevent weight bearing due to patients impulsive behavior. Pt using RW to progress to shower seat for first shower in bathroom. Pt needed verbal cues to sequence undressing to prepare for bathing. Pt needed remainders that Rt LE was NWB and not able to use RT LE. Pt lateral shifting for peri hygiene on shower seat. Pt total (A) for KI don/ doff. Pt demonstrates RT UE deficits this session. Pt abel to reach ~80 degrees and ~30 degrees abduction. Pt reports no pain with supination/ pronation and internal rotation. Pt with pain : shoulder flexion, shoulder abduction and external rotation. Pt pointing to lateral aspect of shoulder in generalize area of deltoid. Pt using Lt UE to elevate Rt UE. Pt perseverating on returning to bed. Pt with reclined position in w/c and reports lower back pain. Pt declining to perform hip flexion to reach for objects on sink level . Pt provided pillows for support  and continued to attempt to recline in chair. Pt repositioned in geri chair at end of session to provide back support and incr activity tolerance OOB. Pt starting SLP session directly following OT session.   Therapy Documentation Precautions:  Precautions Precautions: Fall Precaution Comments: cognitive deficits Required Braces or Orthoses: Knee Immobilizer - Right Knee Immobilizer - Right: On at all times Restrictions Weight Bearing Restrictions: Yes RLE Weight Bearing: Non weight bearing General: General Missed Time Reason: Patient fatigue Vital Signs:   Pain: Pain Assessment Reports pain Rt shoulder with movement Reports HA 6 out 10 Reports pain Rt LE unrated ADL:    See FIM for current functional status  Therapy/Group: Individual Therapy  Peri Maris 12/24/2013, 11:46 AM Pager: [phone]

## 2013-12-24 NOTE — Progress Notes (Signed)
Physical Therapy Session Note  Patient Details  Name: James Hamilton MRN: [id number] Date of Birth: August 18, 1986  Today's Date: 12/24/2013 Time: 4967-5916 and 3846-6599 Time Calculation (min): 43 min and 40 min  Short Term Goals: Week 1:  PT Short Term Goal 1 (Week 1): STGs=LTGs secondary to LOS  Skilled Therapeutic Interventions/Progress Updates:    AM Session: Patient received supine in recliner. Session focused on functional transfers, sitting balance, cognitive remediation, wheelchair mobility, and gait training. Patient performs recliner>wheelchair<>mat via squat pivot and minA. Wheelchair mobility 100' x2 with B UE and supervision. Patient impulsively attempting to transfer wheelchair>mat without therapist present to assist, but is able to be redirected to wait for therapist. Sitting EOM, patient performed pipe tree of medium difficulty with min cues, max cues for sustained attention as patient continues to attempt to lay down. Additionally, patient requires consistent modA for upright sitting secondary to significant posterior lean and pelvic tilt. Gait training in controlled environment x20' with RW and theraband sling to maintain NWB on R LE. Patient impulsively attempting to sit when wheelchair not behind him. Patient returned to room and left supine in bed, 3 rails up, and bed alarm on with all needs within reach.  Patient continues to require max cues to participate in therapy sessions. Additionally, patient still unable to perform R shoulder flexion and unable to hold in flexed position when passively assisted there.  PM Session: Patient received semi-reclined in bed. Patient initially refusing therapy, but with coaxing is agreeable. Session focused on functional transfers, sitting balance, and assessment of R shoulder and hip. In therapy gym, further assessment of R shoulder-presents like RTC injury. Additionally, assessment of R hip in functional movements and patient presenting with  limited hip flexion in unsupported sitting, likely due to increased tightness and guarding preventing patient from ability to achieve enough hip flexion to sit unsupported. Patient returned to room at end of session and left supine in bed with 3 rails up, bed alarm on, and all needs within reach; RN present.  Therapy Documentation Precautions:  Precautions Precautions: Fall Precaution Comments: cognitive deficits Required Braces or Orthoses: Knee Immobilizer - Right Knee Immobilizer - Right: On at all times Restrictions Weight Bearing Restrictions: Yes RLE Weight Bearing: Non weight bearing General: Amount of Missed PT Time (min): 17 Minutes in AM; 20 minutes in PM Missed Time Reason: Patient fatigue in AM; Pain in PM Pain: Pain Assessment Pain Assessment: 0-10 Pain Score: 7  Pain Type: Acute pain Pain Location: Back Pain Orientation: Mid Pain Descriptors / Indicators: Aching;Sore Pain Onset: On-going Pain Intervention(s): RN made aware;Repositioned;Ambulation/increased activity Multiple Pain Sites: No Locomotion : Ambulation Ambulation/Gait Assistance: 3: Mod assist Wheelchair Mobility Distance: 100   See FIM for current functional status  Therapy/Group: Individual Therapy  Lillia Abed. Hilbert Briggs, PT, DPT 12/24/2013, 11:15 AM

## 2013-12-24 NOTE — IPOC Note (Signed)
Overall Plan of Care [facility]) Patient Details Name: James Hamilton MRN: [id number] DOB: Sep 14, 1986  Admitting Diagnosis: TBI WITH R FEM NECK [facility] Problems: Active Problems:   Hypokalemia   Hip fracture, right   Femur fracture, right   Skull fracture   Sickle cell disease   Acute blood loss anemia   TBI (traumatic brain injury)     Functional Problem List: Nursing Behavior;Bowel;Motor;Pain;Safety;Skin Integrity  PT [employer];Endurance;Motor;Pain;Perception;Safety;Skin Integrity  OT Balance;Behavior;Cognition;Endurance;Pain;Safety  SLP Cognition  TR         Basic ADL's: OT Grooming;Bathing;Dressing;Toileting     Advanced  ADL's: OT Simple Meal Preparation;Laundry;Light Housekeeping     Transfers: PT Bed Mobility;Bed to Chair;Car;Furniture  OT Toilet;Tub/Shower     Locomotion: PT Ambulation;Wheelchair Mobility;Stairs     Additional Impairments: OT    SLP Social Cognition   Social Interaction;Problem Solving;Memory;Attention;Awareness  TR      Anticipated Outcomes Item Anticipated Outcome  Self Feeding independent  Swallowing  Mod I   Basic self-care  supervision  Toileting  supervision   Bathroom Transfers supervision  Bowel/Bladder  Continent of bowel and bladder, mod I  Transfers  supervision  Locomotion  household ambulation with supervision, community w/c mobility with supervision, curb negotiation with minA  Communication  Supervision  Cognition  Supervision   Pain  Pain </=3  Safety/Judgment  No falls with injury, mod assist   Therapy Plan: PT Intensity: Minimum of 1-2 x/day ,45 to 90 minutes PT Frequency: 5 out of 7 days PT Duration Estimated Length of Stay: 7-10 days OT Intensity: Minimum of 1-2 x/day, 45 to 90 minutes OT Frequency: 5 out of 7 days OT Duration/Estimated Length of Stay: 2 weeks SLP Intensity: Minumum of 1-2 x/day, 30 to 90 minutes SLP Frequency: 5 out of 7 days SLP Duration/Estimated Length of Stay:  2 weeks       Team Interventions: Nursing Interventions Patient/Family Education;Bowel Management;Pain Management;Medication Management;Skin Care/Wound Management;Psychosocial Support  PT interventions Ambulation/gait training;Balance/vestibular training;Cognitive remediation/compensation;Community reintegration;Discharge planning;Neuromuscular re-education;Functional mobility training;DME/adaptive equipment instruction;Disease management/prevention;Pain management;Patient/family education;Psychosocial support;Skin care/wound management;Splinting/orthotics;UE/LE Coordination activities;UE/LE Strength taining/ROM;Therapeutic Exercise;Therapeutic Activities;Wheelchair propulsion/positioning;Stair training;Visual/perceptual remediation/compensation  OT Interventions Balance/vestibular training;Cognitive remediation/compensation;Community reintegration;Discharge planning;Disease mangement/prevention;DME/adaptive equipment instruction;Functional mobility training;Pain management;Patient/family education;Psychosocial support;Self Care/advanced ADL retraining;Therapeutic Activities;Therapeutic Exercise;UE/LE Strength taining/ROM;Visual/perceptual remediation/compensation;Wheelchair propulsion/positioning  SLP Interventions Cognitive remediation/compensation;Cueing hierarchy;Functional tasks;Environmental controls;Internal/external aids;Dysphagia/aspiration precaution training;Patient/family education;Therapeutic Activities;Speech/Language facilitation  TR Interventions    SW/CM Interventions Discharge Planning;Psychosocial Support;Patient/Family Education    Team Discharge Planning: Destination: PT-Home ,OT- Home , SLP-Home Projected Follow-up: PT-Home health PT;Outpatient PT, OT-  Outpatient OT, SLP-Outpatient SLP Projected Equipment Needs: PT-To be determined, OT- Wheelchair (measurements);Wheelchair cushion (measurements);To be determined, SLP-None recommended by SLP Equipment Details: PT-Patient does not  own any DME; further recommendations TBD upon d/c, OT-Rt LE elevated leg rest of w/c Patient/family involved in discharge planning: PT- Patient,  OT-Patient unable/family or caregiver not available, SLP-Family member/caregiver;Patient  MD ELOS: 12 days Medical Rehab Prognosis:  Excellent Assessment: The patient has been admitted for CIR therapies. The team will be addressing, functional mobility, strength, stamina, balance, safety, adaptive techniques/equipment, self-care, bowel and bladder mgt, patient and caregiver education, NMR, cognitive perceptual awareness, arousal, communication, day-night cycle. Goals have been set at supervision for mobility, self-care, and cognition.    [name], MD, FAAPMR      See Team Conference Notes for weekly updates to the plan of care

## 2013-12-25 ENCOUNTER — Inpatient Hospital Stay (HOSPITAL_COMMUNITY): Payer: PRIVATE HEALTH INSURANCE | Admitting: Occupational Therapy

## 2013-12-25 ENCOUNTER — Inpatient Hospital Stay (HOSPITAL_COMMUNITY): Payer: Medicaid Other | Admitting: Speech Pathology

## 2013-12-25 ENCOUNTER — Encounter (HOSPITAL_COMMUNITY): Payer: Self-pay | Admitting: Orthopedic Surgery

## 2013-12-25 ENCOUNTER — Inpatient Hospital Stay (HOSPITAL_COMMUNITY): Payer: Medicaid Other | Admitting: *Deleted

## 2013-12-25 ENCOUNTER — Inpatient Hospital Stay (HOSPITAL_COMMUNITY): Payer: PRIVATE HEALTH INSURANCE

## 2013-12-25 ENCOUNTER — Encounter (HOSPITAL_COMMUNITY): Payer: PRIVATE HEALTH INSURANCE

## 2013-12-25 MED ORDER — HYDROCODONE-ACETAMINOPHEN 5-325 MG PO TABS
1.0000 | ORAL_TABLET | ORAL | Status: DC | PRN
  Administered 2013-12-25 – 2013-12-29 (×20): 2 via ORAL
  Filled 2013-12-25 (×21): qty 2

## 2013-12-25 NOTE — Plan of Care (Signed)
Problem: RH SAFETY Goal: RH STG ADHERE TO SAFETY PRECAUTIONS W/ASSISTANCE/DEVICE STG Adhere to Safety Precautions With Mod Assistance/Device.  Outcome: Not Progressing Gets OOB unassisted, does not utilize call bell for assistance

## 2013-12-25 NOTE — Progress Notes (Signed)
Occupational Therapy Session Note  Patient Details  Name: Cray Monnin MRN: [id number] Date of Birth: [birth year]  Today's Date: 12/25/2013 Time: 1335-1400 Time Calculation (min): 25 min  Short Term Goals: Week 1:  OT Short Term Goal 1 (Week 1): pt will demonstrate sustain attention for 1 minute to adl task OT Short Term Goal 2 (Week 1): Pt will demonstrate bed mobility HOB flat with min (A) OT Short Term Goal 3 (Week 1): Pt will complete toilet transfer Min (A)  OT Short Term Goal 4 (Week 1): pt will complete grooming task min verbal cues and min (A)  Skilled Therapeutic Interventions/Progress Updates:    1:1- cognitive recovery due to refusal to participate with mobility: PT NOW NWB RT UE with SLING AT ALL TIMES. Pt supine asleep on arrival and aroused to name call. Pt with RT UE outside sling. Pt provided questioning cues for purpose of new sling and unable to verbalize. Pt reports injury of Rt UE as "i have a fracture" and pointing to direct location of fx site. Pt declining to get OOB with therapist. Pt reports "its like prison if I dont do right I get more time." OT providing questioning cues to attempt to find motivation for mobility. Pt verbalized desire to return home. Ot used this to problem solve how patient would leave hospital and what equipment would be needed. Pt reports using RW and OT discussed inability to use RW with RT UE. Pt verbalized w/c will not fit into bathroom at home. Goals revised and SW Lucy notified of DME need changes. Pt will need 3n1 / urinal for toileting and sponge bath if w/c will not pass into bathroom. Pt problem solved placement of w/c on left side of bed to prevent weight bearing on Rt UE/ LE with questioning cues. Pt attempting to use bed to progress to sitting. OT locking bed control so pt must engage core strength to progress to sitting. Pt not attempting mobility even with controls unlocked. Pt remains supine. OT to speak with teammates about a behavior  plan meeting Monday to help with motivation for therapy session.   Therapy Documentation Precautions:  Precautions Precautions: Fall Precaution Comments: cognitive deficits Required Braces or Orthoses: Knee Immobilizer - Right;Sling (Sling R UE) Knee Immobilizer - Right: On at all times Restrictions Weight Bearing Restrictions: Yes RUE Weight Bearing: Non weight bearing RLE Weight Bearing: Non weight bearing Other Position/Activity Restrictions: R UE NWB as of 12/25/13 General: General Amount of Missed OT Time (min): 35 Minutes Vital Signs:   Pain: Pain Assessment Reports fatigue as reason for refusal  See FIM for current functional status  Therapy/Group: Individual Therapy  Peri Maris 12/25/2013, 2:22 PM Pager: [phone]

## 2013-12-25 NOTE — Progress Notes (Signed)
Physical Therapy Session Note  Patient Details  Name: James Hamilton MRN: [id number] Date of Birth: [birth year]  Today's Date: 12/25/2013 Time: 1030-1125 Time Calculation (min): 55 min  Short Term Goals: Week 1:  PT Short Term Goal 1 (Week 1): STGs=LTGs secondary to LOS  Skilled Therapeutic Interventions/Progress Updates:    Patient received semi-reclined in bed. New orders for NWB R UE secondary to R shoulder greater tuberosity fx. Session focused on education about R UE fx and new NWB precautions, functional transfers, sitting balance, and wheelchair mobility. Functional transfers via squat pivot/scoot transfer, patient requires therapist to lift R LE to maintain NWB precautions and therapist holding patient's R hand to maintain NWB precautions R UE. Unsupported sitting with emphasis on anterior weight shift with anterior pelvic tilt 5 x15" hold to improved flexibility of R hip joint in sitting. Switched patient's wheelchair to one arm drive, education provided. Wheelchair mobility x75' with minA for negotiation of obstacles and steering with one arm drive wheelchair. Patient left supine in bed with 3 rails up, bed alarm on, and all needs within reach.  Therapy Documentation Precautions:  Precautions Precautions: Fall Precaution Comments: cognitive deficits Required Braces or Orthoses: Knee Immobilizer - Right;Sling (Sling R UE) Knee Immobilizer - Right: On at all times Restrictions Weight Bearing Restrictions: Yes RUE Weight Bearing: Non weight bearing RLE Weight Bearing: Non weight bearing Other Position/Activity Restrictions: R UE NWB as of 12/25/13 Pain: Pain Assessment Pain Assessment: 0-10 Pain Score: 7  Pain Type: Surgical pain Pain Location: Leg Pain Orientation: Right Pain Radiating Towards: right leg  Pain Descriptors / Indicators: Aching;Sore Pain Frequency: Occasional Pain Onset: On-going Patients Stated Pain Goal: 5 Pain Intervention(s): RN made  aware;Repositioned;Ambulation/increased activity Multiple Pain Sites: No Locomotion : Ambulation Ambulation/Gait Assistance: Not tested (comment) Wheelchair Mobility Distance: 75   See FIM for current functional status  Therapy/Group: Individual Therapy  Lillia Abed. Welma Mccombs, PT, DPT 12/25/2013, 12:17 PM

## 2013-12-25 NOTE — Plan of Care (Signed)
Problem: RH Balance Goal: LTG Patient will maintain dynamic standing balance (PT) LTG: Patient will maintain dynamic standing balance with assistance during mobility activities (PT)  Outcome: Not Applicable Date Met:  12/25/13 Goal discharged 12/25/13 secondary to new NWB precautions R UE (R shoulder greater tuberosity fx)  Problem: RH Ambulation Goal: LTG Patient will ambulate in controlled environment (PT) LTG: Patient will ambulate in a controlled environment, # of feet with assistance (PT).  Outcome: Not Applicable Date Met:  12/25/13 Goal discharged 12/25/13 secondary to new NWB precautions R UE (R shoulder greater tuberosity fx) Goal: LTG Patient will ambulate in home environment (PT) LTG: Patient will ambulate in home environment, # of feet with assistance (PT).  Outcome: Not Applicable Date Met:  12/25/13 Goal discharged 12/25/13 secondary to new NWB precautions R UE (R shoulder greater tuberosity fx)     

## 2013-12-25 NOTE — Progress Notes (Signed)
Orthopedic Tech Progress Note Patient Details:  James Hamilton 09-16-1986 [id number]  Ortho Devices Type of Ortho Device: Sling immobilizer Ortho Device/Splint Interventions: Application   Cammer, Theodoro Parma 12/25/2013, 2:36 PM

## 2013-12-25 NOTE — Plan of Care (Signed)
Problem: RH Balance Goal: LTG Patient will maintain dynamic standing balance (PT) LTG: Patient will maintain dynamic standing balance with assistance during mobility activities (PT)  Outcome: Not Applicable Date Met:  38/75/64 Goal discharged 12/25/13 secondary to new NWB precautions R UE (R shoulder greater trochanter fx)  Problem: RH Ambulation Goal: LTG Patient will ambulate in controlled environment (PT) LTG: Patient will ambulate in a controlled environment, # of feet with assistance (PT).  Outcome: Not Applicable Date Met:  33/29/51 Goal discharged 12/25/13 secondary to new NWB precautions R UE (R shoulder greater trochanter fx) Goal: LTG Patient will ambulate in home environment (PT) LTG: Patient will ambulate in home environment, # of feet with assistance (PT).  Outcome: Not Applicable Date Met:  88/41/66 Goal discharged 12/25/13 secondary to new NWB precautions R UE (R shoulder greater trochanter fx)

## 2013-12-25 NOTE — Progress Notes (Addendum)
Pt un-strapped R knee immobilizer, then called for assistance afterwards. Requesting to have leg "air out". Nurse allowed immobilizer to remain undone but not off for approximately 20 minutes then strapped immobilizer back in place.   Attempted to get OOB un-assisted, refused assistance with cleaning after bowel movement. Importance of safety, to adhere to NWB status and utilizing assistance stressed to pt. Pt States "I'm a grown man" and "It's embarrassing". Appears to be frustrated with staff does not want anyone in the BR with him or any one to assist him with cleaning himself.

## 2013-12-25 NOTE — Progress Notes (Signed)
Subjective/Complaints: Right shoulder pain yesterday. Difficulty lifting right arm. A 12 point review of systems has been performed and if not noted above is otherwise negative.   Objective: Vital Signs: Blood pressure 133/76, pulse 111, temperature 98.5 F (36.9 C), temperature source Oral, resp. rate 19, weight 66.497 kg (146 lb 9.6 oz), SpO2 98.00%. Dg Shoulder Right  12/24/2013   CLINICAL DATA:  Right shoulder pain.  Decreased range of motion.  EXAM: RIGHT SHOULDER - 2+ VIEW  COMPARISON:  Chest radiograph, 04/21/2012  FINDINGS: There is a fracture of the greater tuberosity. There are 2 components, 1 superior and 1 inferior. There is no significant fracture displacement.  No other fractures. The glenohumeral and AC joints are normally aligned. There are no degenerative changes. The underlying ribs are intact.  IMPRESSION: Nondisplaced fractures of the greater tuberosity of the right proximal humerus.   Electronically Signed   By: Lajean Manes M.D.   On: 12/24/2013 18:00   No results found for this basename: WBC, HGB, HCT, PLT,  in the last 72 hours No results found for this basename: NA, K, CL, CO, GLUCOSE, BUN, CREATININE, CALCIUM,  in the last 72 hours CBG (last 3)  No results found for this basename: GLUCAP,  in the last 72 hours  Wt Readings from Last 3 Encounters:  12/23/13 66.497 kg (146 lb 9.6 oz)  12/17/13 70.3 kg (154 lb 15.7 oz)  12/17/13 70.3 kg (154 lb 15.7 oz)    Physical Exam:  General: Awake, a little restless in bed. Has taken off KI and has removed most of ACE wrap. HEENT: Head is normocephalic, atraumatic, PERRLA, EOMI, sclera anicteric, oral mucosa pink and moist, dentition intact, ext ear canals clear,  Neck: Supple without JVD or lymphadenopathy  Heart: Reg rate and rhythm. No murmurs rubs or gallops  Chest: CTA bilaterally without wheezes, rales, or rhonchi; no distress  Abdomen: Soft, non-tender, non-distended, bowel sounds positive.  Extremities:  No clubbing, cyanosis, or edema. Pulses are 2+  Skin: Clean and intact without signs of breakdown. Right leg incisions all intact without drainage. Neuro: more alert, still distracted.   Answers basic questions regarding living situation.   Moves all 4's except for right leg. Senses pain in all 4's.  Musculoskeletal: right leg in KI with ACE wrap over lower portion of leg.  Psych: Pt's affect is flat,    Assessment/Plan: 1. 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 Physiatrist is providing close team supervision and 24 hour management of active medical problems listed below. Physiatrist and rehab team continue to assess barriers to discharge/monitor patient progress toward functional and medical goals.  FIM: FIM - Bathing Bathing Steps Patient Completed: Chest;Right Arm;Left Arm;Abdomen;Front perineal area;Buttocks;Left upper leg;Right upper leg;Left lower leg (including foot) Bathing: 3: Mod-Patient completes 5-7 22f 10 parts or 50-74%  FIM - Upper Body Dressing/Undressing Upper body dressing/undressing steps patient completed: Thread/unthread right sleeve of pullover shirt/dresss;Thread/unthread left sleeve of pullover shirt/dress;Put head through opening of pull over shirt/dress;Pull shirt over trunk Upper body dressing/undressing: 5: Supervision: Safety issues/verbal cues FIM - Lower Body Dressing/Undressing Lower body dressing/undressing steps patient completed: Thread/unthread left underwear leg;Thread/unthread right underwear leg;Pull underwear up/down;Thread/unthread right pants leg;Thread/unthread left pants leg;Pull pants up/down;Don/Doff right sock;Don/Doff left sock Lower body dressing/undressing: 2: Max-Patient completed 25-49% of tasks  FIM - Toileting Toileting steps completed by patient: Performs perineal hygiene Toileting:  2: Max-Patient  completed 1 of 3 steps  FIM - Radio producer Devices: Walker;Elevated toilet seat;Grab bars Toilet Transfers: 3-To toilet/BSC: Mod A (lift or lower assist);3-From toilet/BSC: Mod A (lift or lower assist)  FIM - Bed/Chair Transfer Bed/Chair Transfer Assistive Devices: Arm rests;HOB elevated;Bed rails Bed/Chair Transfer: 3: Supine > Sit: Mod A (lifting assist/Pt. 50-74%/lift 2 legs;3: Sit > Supine: Mod A (lifting assist/Pt. 50-74%/lift 2 legs);3: Bed > Chair or W/C: Mod A (lift or lower assist);3: Chair or W/C > Bed: Mod A (lift or lower assist)  FIM - Locomotion: Wheelchair Distance: 100 Locomotion: Wheelchair: 1: Total Assistance/staff pushes wheelchair (Pt<25%) FIM - Locomotion: Ambulation Locomotion: Ambulation Assistive Devices: Administrator Ambulation/Gait Assistance: Not tested (comment) Locomotion: Ambulation: 0: Activity did not occur  Comprehension Comprehension Mode: Auditory Comprehension: 4-Understands basic 75 - 89% of the time/requires cueing 10 - 24% of the time  Expression Expression Mode: Verbal Expression: 4-Expresses basic 75 - 89% of the time/requires cueing 10 - 24% of the time. Needs helper to occlude trach/needs to repeat words.  Social Interaction Social Interaction: 3-Interacts appropriately 50 - 74% of the time - May be physically or verbally inappropriate.  Problem Solving Problem Solving: 3-Solves basic 50 - 74% of the time/requires cueing 25 - 49% of the time  Memory Memory: 2-Recognizes or recalls 25 - 49% of the time/requires cueing 51 - 75% of the time  Medical Problem List and Plan:  TBI with skull fracture, left femural neck fracture with compression screw and left femoral shaft fracture with IM nail--NWB LLE.     1. DVT Prophylaxis/Anticoagulation: Mechanical: Sequential compression devices, below knee Bilateral lower extremities  2. Pain Management: Has intermittent pain with sickle cell crises. Will continue  oxycodone prn.  3. Mood: Internally distracted with poor insight and impaired awareness. Will monitor as mentation improves. LCSW to follow for evaluation and support.  - ritalin---increase to 10mg  4. Neuropsych: This patient is not capable of making decisions on his own behalf.  5. Acute on chronic anemia/ H/o SS anemia : Hgb up to 9.6  6. Hypokalemia: 3.4, continue supplementation 7. Occult intra-articular fracture of the lateral tibial plateau with intercondylar extension and hemarthrosis and probable MCL strain: Continue NWB, KI.  8. Constipation:  bm's yesterday 9. Right shoulder---proximal, greater tuberosity humerus fx's, sling, no ROM  -will request ortho follow up.  LOS (Days) 4 A FACE TO FACE EVALUATION WAS PERFORMED  SWARTZ,ZACHARY T 12/25/2013 8:16 AM

## 2013-12-25 NOTE — Progress Notes (Signed)
Speech Language Pathology Daily Session Note  Patient Details  Name: James Hamilton MRN: [id number] Date of Birth: 27-Jul-1986  Today's Date: 12/25/2013 Time: 0174-9449 Time Calculation (min): 30 min  Short Term Goals: Week 1: SLP Short Term Goal 1 (Week 1): Pt will utilize call bell to request assistance with Mod question cues.  SLP Short Term Goal 2 (Week 1): Pt will demonstrate sustained attention to a functional task for 15 minutes with Mod A verbal cues for redirection  SLP Short Term Goal 3 (Week 1): Pt will recall new, daily information with Mod A multimodal cueing  SLP Short Term Goal 4 (Week 1): Pt will identify 2 cognitive impairments with Min question cues.  SLP Short Term Goal 5 (Week 1): Pt will utilize an increased vocal intensity at the phrase level with Min verbal cues.  SLP Short Term Goal 6 (Week 1): Pt will consume current diet with minimal overt s/s of aspiration with supervision verbal cues.   Skilled Therapeutic Interventions: Skilled treatment session focused on addressing cognitive goals. Upon arrival, patient was supine in bed and agreeable to participate while sitting up in bed.  Patient demonstrated functional problem solving with basic money management task with Min verbal and visual cues for accuracy.  Patient was able to recall1 out of 2 weight bearing restrictions and demonstrated appropriate use of call bell to request pain medication.  However, patient required Max cues for working-short term recall of requesting medication and RN's response.  Patient's internal distraction appeared to impact recall and result in perseverative on pain and need for medication. Continue with current plan of care.     FIM:  Comprehension Comprehension Mode: Auditory Comprehension: 4-Understands basic 75 - 89% of the time/requires cueing 10 - 24% of the time Expression Expression Mode: Verbal Expression: 4-Expresses basic 75 - 89% of the time/requires cueing 10 - 24% of the  time. Needs helper to occlude trach/needs to repeat words. Social Interaction Social Interaction: 3-Interacts appropriately 50 - 74% of the time - May be physically or verbally inappropriate. Problem Solving Problem Solving: 4-Solves basic 75 - 89% of the time/requires cueing 10 - 24% of the time Memory Memory: 2-Recognizes or recalls 25 - 49% of the time/requires cueing 51 - 75% of the time  Pain Pain Assessment Pain Assessment: 0-10 Pain Score: 6  Pain Type: Acute pain Pain Location: Back Pain Descriptors / Indicators: Aching Pain Onset: Gradual Patients Stated Pain Goal: 0 Pain Intervention(s): RN aware  Therapy/Group: Individual Therapy  Carmelia Roller., CCC-SLP [phone]  M[place] 12/25/2013, 4:48 PM

## 2013-12-25 NOTE — Plan of Care (Signed)
Problem: RH Balance Goal: LTG Patient will maintain dynamic standing balance (PT) LTG: Patient will maintain dynamic standing balance with assistance during mobility activities (PT)  Outcome: Not Applicable Date Met:  93/96/88 Goal discharged 12/25/13 secondary to new NWB precautions R UE (R greater tuberosity fx)  Problem: RH Ambulation Goal: LTG Patient will ambulate in controlled environment (PT) LTG: Patient will ambulate in a controlled environment, # of feet with assistance (PT).  Outcome: Not Applicable Date Met:  64/84/72 Goal discharged 12/25/13 secondary to new NWB precautions R UE (R greater tuberosity fx) Goal: LTG Patient will ambulate in home environment (PT) LTG: Patient will ambulate in home environment, # of feet with assistance (PT).  Outcome: Not Applicable Date Met:  06/01/81 Goal discharged 12/25/13 secondary to new NWB precautions R UE (R greater trochanter fx)

## 2013-12-25 NOTE — Progress Notes (Signed)
Occupational Therapy Session Note  Patient Details  Name: James Hamilton MRN: [id number] Date of Birth: 11-05-1986  Today's Date: 12/25/2013 Time: 0800-0945 Time Calculation (min): 105 min  Short Term Goals: Week 1:  OT Short Term Goal 1 (Week 1): pt will demonstrate sustain attention for 1 minute to adl task OT Short Term Goal 2 (Week 1): Pt will demonstrate bed mobility HOB flat with min (A) OT Short Term Goal 3 (Week 1): Pt will complete toilet transfer Min (A)  OT Short Term Goal 4 (Week 1): pt will complete grooming task min verbal cues and min (A)  Skilled Therapeutic Interventions/Progress Updates:    Pt resting in bed upon arrival.  Pt required max encouragement and extra time to initiate sitting EOB to transfer to w/c to perform shaving tasks at sink.  After shaving patient stated that he wouldn't do anything else until he received his pain medication.  Pt returned to bed and awaited nursing with medications.  Pt continually commented that "we just didn't understand what he was going through" and he could only do so much.  Pt resistant to completing ADLs and finally agreed to get up after 5 minute rest.  After 5 minutes patient was still resistant to taking a shower.  Patient finally agreed to take shower.  Pt required mod A for shower transfers and bathing.  Pt returned to bed to complete dressing tasks.  Pt commented at end of session that he felt like "these things" always happened to him and expressed frustration with his circumstances.  Therapist offered pastoral surfaces but patient declined at this time.  Social Worker notified.  Pt required extra time to initiate tasks, extra time to complete tasks, and max encouragement to actively participate throughout session.    Therapy Documentation Precautions:  Precautions Precautions: Fall Precaution Comments: cognitive deficits Required Braces or Orthoses: Knee Immobilizer - Right Knee Immobilizer - Right: On at all  times Restrictions Weight Bearing Restrictions: Yes RLE Weight Bearing: Non weight bearing (needs reminders often)   Pain: Pain Assessment Pain Assessment: 0-10 Pain Score: 8  Pain Type: Acute pain Pain Location: Arm Pain Orientation: Right Pain Radiating Towards: right leg  Pain Descriptors / Indicators: Aching Pain Frequency: Occasional Pain Onset: Gradual Patients Stated Pain Goal: 5 Pain Intervention(s): RN aware; repositioned See FIM for current functional status  Therapy/Group: Individual Therapy  James Hamilton 12/25/2013, 10:37 AM

## 2013-12-25 NOTE — Progress Notes (Signed)
Orthopedic Tech Progress Note Patient Details:  James Hamilton 30-Mar-1986 [id number]  Ortho Devices Type of Ortho Device: Arm sling Ortho Device/Splint Interventions: Application   Cammer, Theodoro Parma 12/25/2013, 1:52 PM

## 2013-12-26 ENCOUNTER — Inpatient Hospital Stay (HOSPITAL_COMMUNITY): Payer: Medicaid Other

## 2013-12-26 DIAGNOSIS — S06309A Unspecified focal traumatic brain injury with loss of consciousness of unspecified duration, initial encounter: Secondary | ICD-10-CM

## 2013-12-26 DIAGNOSIS — S0630AA Unspecified focal traumatic brain injury with loss of consciousness status unknown, initial encounter: Secondary | ICD-10-CM

## 2013-12-26 DIAGNOSIS — S020XXA Fracture of vault of skull, initial encounter for closed fracture: Secondary | ICD-10-CM

## 2013-12-26 DIAGNOSIS — S72309A Unspecified fracture of shaft of unspecified femur, initial encounter for closed fracture: Secondary | ICD-10-CM

## 2013-12-26 DIAGNOSIS — S72009A Fracture of unspecified part of neck of unspecified femur, initial encounter for closed fracture: Secondary | ICD-10-CM

## 2013-12-26 DIAGNOSIS — IMO0002 Reserved for concepts with insufficient information to code with codable children: Secondary | ICD-10-CM

## 2013-12-26 NOTE — Progress Notes (Signed)
Removing and or loosening KI. Encouraged and reminded to leave in place. 2 vicodin given 3 times over past 12 hours. PRN robaxin and trazodone given 0013 with min. Effectiveness. Spilled urinal this AM, declined need to be checked by staff. Patrici Ranks A

## 2013-12-26 NOTE — Progress Notes (Signed)
Subjective/Complaints: Right shoulder pain yesterday. Difficulty lifting right arm. A 12 point review of systems has been performed and if not noted above is otherwise negative.   Objective: Vital Signs: Blood pressure 115/71, pulse 95, temperature 97.3 F (36.3 C), temperature source Oral, resp. rate 20, weight 66.497 kg (146 lb 9.6 oz), SpO2 100.00%. Dg Shoulder Right  12/24/2013   CLINICAL DATA:  Right shoulder pain.  Decreased range of motion.  EXAM: RIGHT SHOULDER - 2+ VIEW  COMPARISON:  Chest radiograph, 04/21/2012  FINDINGS: There is a fracture of the greater tuberosity. There are 2 components, 1 superior and 1 inferior. There is no significant fracture displacement.  No other fractures. The glenohumeral and AC joints are normally aligned. There are no degenerative changes. The underlying ribs are intact.  IMPRESSION: Nondisplaced fractures of the greater tuberosity of the right proximal humerus.   Electronically Signed   By: Lajean Manes M.D.   On: 12/24/2013 18:00   No results found for this basename: WBC, HGB, HCT, PLT,  in the last 72 hours No results found for this basename: NA, K, CL, CO, GLUCOSE, BUN, CREATININE, CALCIUM,  in the last 72 hours CBG (last 3)  No results found for this basename: GLUCAP,  in the last 72 hours  Wt Readings from Last 3 Encounters:  12/23/13 66.497 kg (146 lb 9.6 oz)  12/17/13 70.3 kg (154 lb 15.7 oz)  12/17/13 70.3 kg (154 lb 15.7 oz)    Physical Exam:  General: Awake, a little restless in bed. KI on HEENT: Head is normocephalic, atraumatic, PERRLA, EOMI, sclera anicteric, oral mucosa pink and moist, dentition intact, ext ear canals clear,  Neck: Supple without JVD or lymphadenopathy  Heart: Reg rate and rhythm. No murmurs rubs or gallops  Chest: CTA bilaterally without wheezes, rales, or rhonchi; no distress  Abdomen: Soft, non-tender, non-distended, bowel sounds positive.  Extremities: No clubbing, cyanosis, or edema. Pulses are  2+  Skin: Clean and intact without signs of breakdown. Right leg incisions all intact without drainage. Neuro: more alert, still distracted.   Answers basic questions regarding living situation.   Moves all 4's except for right leg. Senses pain in all 4's. 3-/5 R delt, 4/5 R bi, tri, grip, 5/5 in LUE and LLE, 2- R hip flex, 3/5 ankle Musculoskeletal: right leg in KI with ACE wrap over lower portion of leg.  Psych: Pt's affect is flat,    Assessment/Plan: 1. 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 Physiatrist is providing close team supervision and 24 hour management of active medical problems listed below. Physiatrist and rehab team continue to assess barriers to discharge/monitor patient progress toward functional and medical goals.  FIM: FIM - Bathing Bathing Steps Patient Completed: Chest;Right Arm;Left Arm;Abdomen;Front perineal area;Right upper leg;Left upper leg Bathing: 3: Mod-Patient completes 5-7 17f 10 parts or 50-74%  FIM - Upper Body Dressing/Undressing Upper body dressing/undressing steps patient completed: Thread/unthread right sleeve of pullover shirt/dresss;Thread/unthread left sleeve of pullover shirt/dress;Put head through opening of pull over shirt/dress;Pull shirt over trunk Upper body dressing/undressing: 5: Supervision: Safety issues/verbal cues FIM - Lower Body Dressing/Undressing Lower body dressing/undressing steps patient completed: Thread/unthread left underwear leg;Pull underwear up/down;Thread/unthread left pants leg;Pull pants up/down Lower body dressing/undressing: 2: Max-Patient completed 25-49% of tasks  FIM - Toileting Toileting steps completed by patient: Performs perineal hygiene Toileting Assistive Devices: Grab bar or  rail for support Toileting: 2: Max-Patient completed 1 of 3 steps  FIM - Sport and exercise psychologist Devices: Walker;Elevated toilet seat;Grab bars Toilet Transfers: 3-To toilet/BSC: Mod A (lift or lower assist);3-From toilet/BSC: Mod A (lift or lower assist)  FIM - Bed/Chair Transfer Bed/Chair Transfer Assistive Devices: Arm rests;HOB elevated Bed/Chair Transfer: 2: Supine > Sit: Max A (lifting assist/Pt. 25-49%);4: Sit > Supine: Min A (steadying pt. > 75%/lift 1 leg);4: Chair or W/C > Bed: Min A (steadying Pt. > 75%);4: Bed > Chair or W/C: Min A (steadying Pt. > 75%)  FIM - Locomotion: Wheelchair Distance: 75 Locomotion: Wheelchair: 2: Travels 50 - 149 ft with minimal assistance (Pt.>75%) FIM - Locomotion: Ambulation Locomotion: Ambulation Assistive Devices: Administrator Ambulation/Gait Assistance: Not tested (comment) Locomotion: Ambulation: 0: Activity did not occur  Comprehension Comprehension Mode: Auditory Comprehension: 4-Understands basic 75 - 89% of the time/requires cueing 10 - 24% of the time  Expression Expression Mode: Verbal Expression: 4-Expresses basic 75 - 89% of the time/requires cueing 10 - 24% of the time. Needs helper to occlude trach/needs to repeat words.  Social Interaction Social Interaction: 3-Interacts appropriately 50 - 74% of the time - May be physically or verbally inappropriate.  Problem Solving Problem Solving: 4-Solves basic 75 - 89% of the time/requires cueing 10 - 24% of the time  Memory Memory: 2-Recognizes or recalls 25 - 49% of the time/requires cueing 51 - 75% of the time  Medical Problem List and Plan:  TBI with skull fracture, left femural neck fracture with compression screw and left femoral shaft fracture with IM nail--NWB LLE.     1. DVT Prophylaxis/Anticoagulation: Mechanical: Sequential compression devices, below knee Bilateral lower extremities  2. Pain Management: Has intermittent pain with sickle cell crises. Will continue oxycodone prn.  3. Mood: Internally distracted with poor insight and impaired  awareness. Will monitor as mentation improves. LCSW to follow for evaluation and support.  - ritalin---increase to 10mg  4. Neuropsych: This patient is not capable of making decisions on his own behalf.  5. Acute on chronic anemia/ H/o SS anemia : Hgb up to 9.6  6. Hypokalemia: 3.4, continue supplementation 7. Occult intra-articular fracture of the lateral tibial plateau with intercondylar extension and hemarthrosis and probable MCL strain: Continue NWB, KI.  8. Constipation:  bm's yesterday 9. Right shoulder---proximal, greater tuberosity humerus fx's, sling, no ROM  -will request ortho follow up.  LOS (Days) 5 A FACE TO FACE EVALUATION WAS PERFORMED  Charlett Blake 12/26/2013 7:45 AM

## 2013-12-26 NOTE — Progress Notes (Signed)
Physical Therapy Note  Patient Details  Name: James Hamilton MRN: [id number] Date of Birth: [birth year] Today's Date: 12/26/2013 0950-1035, 45 min individual therapy  Throbbing pain L proximal thigh, and LBP rated 6/10.  RN notified  Bed mobility in flat bed with rail, supervision, VCs and tactile cues to prevent RUE wt bearing.  Lateral scoot transfer bed> w/c to L with min assist, again tending to bear wt on RUE despite VCS, sling. Pt cannot remember why he has WBing precautions for RUE.  W/c propulsion in 1 arm drive w/c x 75', supervision, limited by L LE pain.  Pt declined sitting or standing during tx due to R LE pain.  W/c> mat lateral scoot transfer as above. Sit> supine with supervision.  neuromuscular re-education via manual cues, VCs. In supine: L 1/2 bridging, L resisted hip abd/add, L resisted ankle pumps,  sidelying : R active assistive R hip ext and abd, R ankle pumps.  Scooting laterally in supine with mod assist; supine> sit with mod assist. Transfer as above.  Returned to room; pt declined sitting up any longer.  W/c> flat bed without rails,  bed transfer as above.  Bed alarm set.  Therapist encouraged pt to lie on side to reduce LBP.  Bed alarm set and father in room.  Cobe Viney 12/26/2013, 10:14 AM

## 2013-12-26 NOTE — Progress Notes (Signed)
Speech Language Pathology Daily Session Note  Patient Details  Name: Tevan Marian MRN: [id number] Date of Birth: 06-23-1986  Today's Date: 12/26/2013 Time: 0092-3300 Time Calculation (min): 45 min  Short Term Goals: Week 1: SLP Short Term Goal 1 (Week 1): Pt will utilize call bell to request assistance with Mod question cues.  SLP Short Term Goal 2 (Week 1): Pt will demonstrate sustained attention to a functional task for 15 minutes with Mod A verbal cues for redirection  SLP Short Term Goal 3 (Week 1): Pt will recall new, daily information with Mod A multimodal cueing  SLP Short Term Goal 4 (Week 1): Pt will identify 2 cognitive impairments with Min question cues.  SLP Short Term Goal 5 (Week 1): Pt will utilize an increased vocal intensity at the phrase level with Min verbal cues.  SLP Short Term Goal 6 (Week 1): Pt will consume current diet with minimal overt s/s of aspiration with supervision verbal cues.   Skilled Therapeutic Interventions: Skilled treatment focused on cognitive and dysphagia goals. SLP facilitated session with Max-Total A for utilization of a slow rate and small bites/sips throughout breakfast meal. Despite multimodal cueing, pt consumed large quantities of solid PO at a time, resulting in strong cough reflex. Pt attributed cough to SLP "talking in his ear" while he was trying to eat, as he eats this way all the time at home. Pt became mildly verbally agitated at SLP attempts to increase use of swallowing strategies, saying that it made him "really mad when people try to tell me how to eat. If you change how I eat you might cause more damage." SLP provided education regarding aspiration precautions. After completion of meal, pt recalled rules from card game several days ago with Min cues, and applied rules during game with Min-Mod cues for recall. Continue plan of care.   FIM:  Comprehension Comprehension Mode: Auditory Comprehension: 4-Understands basic 75 - 89% of  the time/requires cueing 10 - 24% of the time Expression Expression Mode: Verbal Expression: 4-Expresses basic 75 - 89% of the time/requires cueing 10 - 24% of the time. Needs helper to occlude trach/needs to repeat words. Social Interaction Social Interaction: 3-Interacts appropriately 50 - 74% of the time - May be physically or verbally inappropriate. Problem Solving Problem Solving: 4-Solves basic 75 - 89% of the time/requires cueing 10 - 24% of the time Memory Memory: 3-Recognizes or recalls 50 - 74% of the time/requires cueing 25 - 49% of the time FIM - Eating Eating Activity: 5: Needs verbal cues/supervision  Pain Pain Assessment Pain Assessment: No/denies pain Pain Score: 5  Pain Type: Surgical pain Pain Location: Leg Pain Orientation: Right Pain Descriptors / Indicators: Aching Pain Intervention(s): Medication (See eMAR)  Therapy/Group: Individual Therapy   Germain Osgood, M.A. CCC-SLP [phone]   Germain Osgood 12/26/2013, 12:35 PM

## 2013-12-27 ENCOUNTER — Inpatient Hospital Stay (HOSPITAL_COMMUNITY): Payer: Medicaid Other | Admitting: *Deleted

## 2013-12-27 NOTE — Progress Notes (Signed)
Physical Therapy Session Note  Patient Details  Name: James Hamilton MRN: [id number] Date of Birth: [birth year]  Today's Date: 12/27/2013 Time: 0277-4128 Time Calculation (min): 54 min  Short Term Goals: Week 1:  PT Short Term Goal 1 (Week 1): STGs=LTGs secondary to LOS  Skilled Therapeutic Interventions/Progress Updates:    Patient received supine in bed, father present. Session focused on cognitive remediation during all functional mobility tasks, emphasis on NWB precautions R UE/LE. Patient very resistant to participating in therapy session, but able to be encouraged to get OOB and go to therapy gym. Wheelchair mobility in one arm drive [id number] with minA for negotiation of obstacles. Patient with poor frustration tolerance throughout, asking repeatedly to be pushed. Patient requires minA for lateral/scoot transfers secondary to therapist holding R hand (in handshake) to prevent patient from weight bearing through R UE. Throughout session, patient is told repeatedly about maintaining NWB through R UE and patient states "I'm going to do it how I want to." It seems the only way to maintain NWB precautions on R UE/LE during transfers is for foot stool to be placed for R LE to rest on while therapist holds hands with R hand. In therapy gym, patient continues to lay down on mat despite being instructed to sit up. Opportunities presented for rest breaks after performing exercises, but patient continues to maintain laying down and resists when therapist attempts to assist him to sitting. Patient continues to ask to go back to room, so in order to engage patient in remainder of therapy session, patient was instructed that he must direct every aspect of care for transferring mat>wheelchair, wheelchair parts management, and propulsion and wayfinding back to room. During this activity, patient continues to demonstrate unsafe and impulsive behaviors: attempting to transfer to wheelchair before it is close enough,  not locking brakes, not removing leg rests or arm rests, etc. Once in chair, patient requires visual demo for donning of leg rests and patient demonstrating poor frustration tolerance with inability to don. Patient requires visual, verbal, and tactile cues as well as additional time, but is then able to don leg rests and properly place arm rest. When frustrated, patient states "I'll just walk back to the room." In room, patient required to direct care for transfer to recliner and manage wheelchair parts, which requires mod verbal cues to do so. Patient left in recliner with father present. Educated patient he has another therapy session at 1:00 and patient verbalized understanding.  Discussion with patient's father about patient's continued resistance and decreased participation. Patient's father states he is frustrated because patient "has always had a lazy bone". Educated patient's father about effects of brain injury and that the treatment team will be working with patient on best way to address patient's goals and goals of therapy team during stay.  Therapy Documentation Precautions:  Precautions Precautions: Fall Precaution Comments: cognitive deficits Required Braces or Orthoses: Knee Immobilizer - Right;Sling Knee Immobilizer - Right: On at all times Restrictions Weight Bearing Restrictions: Yes RUE Weight Bearing: Non weight bearing RLE Weight Bearing: Non weight bearing Other Position/Activity Restrictions: R UE NWB as of 12/25/13 Pain: Pain Assessment Pain Assessment: 0-10 Pain Score: 7  Pain Type: Surgical pain Pain Location: Leg Pain Orientation: Right Pain Descriptors / Indicators: Aching;Sore Pain Onset: On-going Pain Intervention(s): RN made aware;Ambulation/increased activity;Repositioned Multiple Pain Sites: No  See FIM for current functional status  Therapy/Group: Individual Therapy  Lillia Abed. Bryler Dibble, PT, DPT 12/27/2013, 11:24 AM

## 2013-12-27 NOTE — Progress Notes (Addendum)
Occupational Therapy Session Note  Patient Details  Name: Ryot Burrous MRN: [id number] Date of Birth: [birth year]  Today's Date: 12/27/2013 Time:  -   0830-0930   (60 min) 1st session    Short Term Goals: Week 1:  OT Short Term Goal 1 (Week 1): pt will demonstrate sustain attention for 1 minute to adl task OT Short Term Goal 2 (Week 1): Pt will demonstrate bed mobility HOB flat with min (A) OT Short Term Goal 3 (Week 1): Pt will complete toilet transfer Min (A)  OT Short Term Goal 4 (Week 1): pt will complete grooming task min verbal cues and min (A)  Skilled Therapeutic Interventions/Progress Updates:      1st session:  Pt. Sleeping upon OT arrival.  Easily aroused.but refused to get up and take shower.  Kept asking OT to wait until a certain time and then he would get up.  He refused to get up at time he promised.   Showed decreased initiation throughout session.  Pt's father present and explained to son purpose of therapy.  Father left, but pt still not getting up to shower or bathed.  Agreed to dress supine in bed.  .  See Fim for levels.  Pt left in bed with call bell and phone in reach.  Communicated to Father in family room about session  And pt's lack of initiation to engage in activity.    Time:  1400-1445   (45 min)  2nd session Pain:  8/10  Right leg Individual session  2nd session:  Pt. Lying in bed upon OT arrival.  Pt. Agreed to get OOB and go to gym and exercise with heat applied to his right leg.  Transferred from bed to wc with min assist.  Pt rolled to gym with max assist.  Transferred to mat   Applied heat to right leg.  Did LUE exercises with 5 # weight.  Performed shoulder flex, abduction, elbow flexion/extenion.  Pt. Transferred back to wc.  He said the heat dropped his pain to 4/10.  RN ordered heating pad for patient to use in room.     Therapy Documentation Precautions:  Precautions Precautions: Fall Precaution Comments: cognitive deficits Required Braces or  Orthoses: Knee Immobilizer - Right;Sling (Sling R UE) Knee Immobilizer - Right: On at all times Restrictions Weight Bearing Restrictions: Yes (Poor compliance, even with cues) RUE Weight Bearing: Non weight bearing RLE Weight Bearing: Non weight bearing Other Position/Activity Restrictions: R UE NWB as of 12/25/13    Pain:  1st session Pain Assessment Pain Assessment: 0-10 Pain Score: 8  Pain Type: Surgical pain Pain Location: Leg Pain Orientation: Right Pain Descriptors / Indicators: Aching Pain Frequency: Intermittent Pain Onset: On-going Pain Intervention(s): Medication (See eMAR) Multiple Pain Sites: No        See FIM for current functional status  Therapy/Group: Individual Therapy  Lisa Roca 12/27/2013, 8:52 AM

## 2013-12-27 NOTE — Progress Notes (Signed)
Physical Therapy Session Note  Patient Details  Name: James Hamilton MRN: [id number] Date of Birth: 1985/12/04  Today's Date: 12/27/2013 Time: 1660-6301 Time Calculation (min): 19 min  Short Term Goals: Week 1:  PT Short Term Goal 1 (Week 1): STGs=LTGs secondary to LOS  Skilled Therapeutic Interventions/Progress Updates:    Patient received semi-reclined in bed. Patient agreeable to participate if he can use a heating pad. Patient agreed to sitting EOB for sitting balance task with heating pad applied to back. Patient supine>sit with supervision. Sitting EOB with manual facilitation for anterior weight shift to stretch R hip posterior capsule and heating pad applied to back. Patient immediately asking to lay down with heating pad. Patient encouraged to remain sitting, but returns to supine and therefore, heating pad removed. Patient unwilling to participate for remainder of session, even when heating pad is used for motivation. Patient left supine in bed with 3 rails up and bed alarm on. RN aware.  Therapy Documentation Precautions:  Precautions Precautions: Fall Precaution Comments: cognitive deficits Required Braces or Orthoses: Knee Immobilizer - Right;Sling Knee Immobilizer - Right: On at all times Restrictions Weight Bearing Restrictions: Yes RUE Weight Bearing: Non weight bearing RLE Weight Bearing: Non weight bearing Other Position/Activity Restrictions: R UE NWB as of 12/25/13 General: Amount of Missed PT Time (min): 11 Minutes Missed Time Reason: Patient unwilling/refused to participate without medical reason Pain: Pain Assessment Pain Assessment: 0-10 Pain Score: 7  Pain Type: Acute pain Pain Location: Back Pain Orientation: Mid Pain Descriptors / Indicators: Aching;Sore Pain Onset: On-going Pain Intervention(s): RN made aware;Repositioned Multiple Pain Sites: No  See FIM for current functional status  Therapy/Group: Individual Therapy  Lillia Abed. [name], PT, DPT 12/27/2013, 1:17 PM

## 2013-12-27 NOTE — Progress Notes (Addendum)
Subjective/Complaints: Father asking if KI can be opened to let it "air out" Pt without pain while in bed A 12 point review of systems has been performed and if not noted above is otherwise negative.   Objective: Vital Signs: Blood pressure 143/84, pulse 87, temperature 98 F (36.7 C), temperature source Oral, resp. rate 17, weight 66.497 kg (146 lb 9.6 oz), SpO2 100.00%. No results found. No results found for this basename: WBC, HGB, HCT, PLT,  in the last 72 hours No results found for this basename: NA, K, CL, CO, GLUCOSE, BUN, CREATININE, CALCIUM,  in the last 72 hours CBG (last 3)  No results found for this basename: GLUCAP,  in the last 72 hours  Wt Readings from Last 3 Encounters:  12/23/13 66.497 kg (146 lb 9.6 oz)  12/17/13 70.3 kg (154 lb 15.7 oz)  12/17/13 70.3 kg (154 lb 15.7 oz)    Physical Exam:  General: Awake, a little restless in bed. KI on HEENT: Head is normocephalic, atraumatic, PERRLA, EOMI, sclera anicteric, oral mucosa pink and moist, dentition intact, ext ear canals clear,  Neck: Supple without JVD or lymphadenopathy  Heart: Reg rate and rhythm. No murmurs rubs or gallops  Chest: CTA bilaterally without wheezes, rales, or rhonchi; no distress  Abdomen: Soft, non-tender, non-distended, bowel sounds positive.  Extremities: No clubbing, cyanosis, or edema. Pulses are 2+  Skin: Clean and intact without signs of breakdown. Right leg incisions all intact without drainage. Neuro: more alert, still distracted.   Answers basic questions regarding living situation.   Moves all 4's except for right leg. Senses pain in all 4's. 3-/5 R delt, 4/5 R bi, tri, grip, 5/5 in LUE and LLE, 2- R hip flex, 3/5 ankle Musculoskeletal: right leg in KI with ACE wrap over lower portion of leg.  Psych: Pt's affect is flat,    Assessment/Plan: 1. Functional deficits secondary to TBI, right femur,tibial plateau fx's which require 3+ hours per day of interdisciplinary therapy  in a comprehensive inpatient rehab setting. Physiatrist is providing close team supervision and 24 hour management of active medical problems listed below. Physiatrist and rehab team continue to assess barriers to discharge/monitor patient progress toward functional and medical goals.  FIM: FIM - Bathing Bathing Steps Patient Completed: Chest;Right Arm;Left Arm;Abdomen;Front perineal area;Right upper leg;Left upper leg Bathing: 3: Mod-Patient completes 5-7 43f 10 parts or 50-74%  FIM - Upper Body Dressing/Undressing Upper body dressing/undressing steps patient completed: Thread/unthread right sleeve of pullover shirt/dresss;Thread/unthread left sleeve of pullover shirt/dress;Put head through opening of pull over shirt/dress;Pull shirt over trunk Upper body dressing/undressing: 5: Supervision: Safety issues/verbal cues FIM - Lower Body Dressing/Undressing Lower body dressing/undressing steps patient completed: Thread/unthread left underwear leg;Pull underwear up/down;Thread/unthread left pants leg;Pull pants up/down Lower body dressing/undressing: 2: Max-Patient completed 25-49% of tasks  FIM - Toileting Toileting steps completed by patient: Performs perineal hygiene Toileting Assistive Devices: Grab bar or rail for support Toileting: 2: Max-Patient completed 1 of 3 steps  FIM - Radio producer Devices: Walker;Elevated toilet seat;Grab bars Toilet Transfers: 3-To toilet/BSC: Mod A (lift or lower assist);3-From toilet/BSC: Mod A (lift or lower assist)  FIM - Engineer, site Assistive Devices: Arm rests Bed/Chair Transfer: 4: Bed > Chair or W/C: Min A (steadying Pt. > 75%);4: Chair or W/C > Bed: Min A (steadying Pt. > 75%)  FIM - Locomotion: Wheelchair Distance: 75 Locomotion: Wheelchair: 1: Travels less than 50 ft with supervision, cueing or  coaxing FIM - Locomotion: Ambulation Locomotion: Ambulation Assistive Devices: Walker -  Rolling Ambulation/Gait Assistance: Not tested (comment) Locomotion: Ambulation: 0: Activity did not occur  Comprehension Comprehension Mode: Auditory Comprehension: 4-Understands basic 75 - 89% of the time/requires cueing 10 - 24% of the time  Expression Expression Mode: Verbal Expression: 4-Expresses basic 75 - 89% of the time/requires cueing 10 - 24% of the time. Needs helper to occlude trach/needs to repeat words.  Social Interaction Social Interaction: 3-Interacts appropriately 50 - 74% of the time - May be physically or verbally inappropriate.  Problem Solving Problem Solving: 4-Solves basic 75 - 89% of the time/requires cueing 10 - 24% of the time  Memory Memory: 2-Recognizes or recalls 25 - 49% of the time/requires cueing 51 - 75% of the time  Medical Problem List and Plan:  TBI with skull fracture, left femural neck fracture with compression screw and left femoral shaft fracture with IM nail--NWB LLE.     1. DVT Prophylaxis/Anticoagulation: Mechanical: Sequential compression devices, below knee Bilateral lower extremities  2. Pain Management: Has intermittent pain with sickle cell crises. Will continue oxycodone prn.  3. Mood: Internally distracted with poor insight and impaired awareness. Will monitor as mentation improves. LCSW to follow for evaluation and support.  - ritalin---increase to 10mg  4. Neuropsych: This patient is not capable of making decisions on his own behalf.  5. Acute on chronic anemia/ H/o SS anemia : Hgb up to 9.6  6. Hypokalemia: 3.4, continue supplementation 7. Occult intra-articular fracture of the lateral tibial plateau with intercondylar extension and hemarthrosis and probable MCL strain: Continue NWB, KI. May open KI but not remove for 1 hour per day, only if supervised  8. Constipation:  bm's yesterday 9. Right shoulder---proximal, greater tuberosity humerus fx's, sling, no ROM  -will request ortho follow up.  LOS (Days) 6 A FACE TO FACE  EVALUATION WAS PERFORMED  James Hamilton E 12/27/2013 9:30 AM

## 2013-12-27 NOTE — Progress Notes (Signed)
Taking PRN vicodin and robaxin for pain. C/O pain to RLE. KI irritates patient. Loosens or removes KI at times. Continuing to encourage and remind patient to leave brace in place.PRN trazodone 50mg  given at 2158.James Hamilton A

## 2013-12-28 ENCOUNTER — Inpatient Hospital Stay (HOSPITAL_COMMUNITY): Payer: Medicaid Other | Admitting: Speech Pathology

## 2013-12-28 ENCOUNTER — Encounter (HOSPITAL_COMMUNITY): Payer: PRIVATE HEALTH INSURANCE

## 2013-12-28 ENCOUNTER — Inpatient Hospital Stay (HOSPITAL_COMMUNITY): Payer: PRIVATE HEALTH INSURANCE

## 2013-12-28 ENCOUNTER — Inpatient Hospital Stay (HOSPITAL_COMMUNITY): Payer: Medicaid Other | Admitting: Physical Therapy

## 2013-12-28 ENCOUNTER — Inpatient Hospital Stay (HOSPITAL_COMMUNITY): Payer: Medicaid Other | Admitting: *Deleted

## 2013-12-28 ENCOUNTER — Inpatient Hospital Stay (HOSPITAL_COMMUNITY): Payer: Medicaid Other | Admitting: Occupational Therapy

## 2013-12-28 ENCOUNTER — Inpatient Hospital Stay (HOSPITAL_COMMUNITY): Payer: Medicaid Other

## 2013-12-28 LAB — BASIC METABOLIC PANEL
BUN: 13 mg/dL (ref 6–23)
CO2: 29 mEq/L (ref 19–32)
Calcium: 9.7 mg/dL (ref 8.4–10.5)
Chloride: 94 mEq/L — ABNORMAL LOW (ref 96–112)
Creatinine, Ser: 0.86 mg/dL (ref 0.50–1.35)
GFR calc non Af Amer: >90 mL/min (ref >90)
Glucose, Bld: 139 mg/dL — ABNORMAL HIGH (ref 70–99)
Potassium: 4.3 mEq/L (ref 3.7–5.3)
Sodium: 134 mEq/L — ABNORMAL LOW (ref 137–147)

## 2013-12-28 LAB — URINALYSIS, ROUTINE W REFLEX MICROSCOPIC
Bilirubin Urine: NEGATIVE
Glucose, UA: NEGATIVE mg/dL
Hgb urine dipstick: NEGATIVE
KETONES UR: NEGATIVE mg/dL
LEUKOCYTES UA: NEGATIVE
Nitrite: NEGATIVE
Protein, ur: NEGATIVE mg/dL
Specific Gravity, Urine: 1.016 (ref 1.005–1.030)
Urobilinogen, UA: 0.2 mg/dL (ref 0.0–1.0)
pH: 7 (ref 5.0–8.0)

## 2013-12-28 LAB — CBC
HCT: 29.2 % — ABNORMAL LOW (ref 39.0–52.0)
Hemoglobin: 10.1 g/dL — ABNORMAL LOW (ref 13.0–17.0)
MCH: 26 pg (ref 26.0–34.0)
MCHC: 34.6 g/dL (ref 30.0–36.0)
MCV: 75.1 fL — AB (ref 78.0–100.0)
PLATELETS: 503 10*3/uL — AB (ref 150–400)
RBC: 3.89 MIL/uL — ABNORMAL LOW (ref 4.22–5.81)
RDW: 16.3 % — AB (ref 11.5–15.5)
WBC: 17.8 10*3/uL — ABNORMAL HIGH (ref 4.0–10.5)

## 2013-12-28 MED ORDER — QUETIAPINE FUMARATE 50 MG PO TABS
50.0000 mg | ORAL_TABLET | Freq: Every day | ORAL | Status: DC
  Administered 2013-12-29 – 2014-01-10 (×13): 50 mg via ORAL
  Filled 2013-12-28 (×15): qty 1

## 2013-12-28 NOTE — Progress Notes (Signed)
Occupational Therapy Session Note  Patient Details  Name: James Hamilton MRN: [id number] Date of Birth: 1986/07/14  Today's Date: 12/28/2013 Time: 5462-7035 Time Calculation (min): 45 min  Short Term Goals: Week 1:  OT Short Term Goal 1 (Week 1): pt will demonstrate sustain attention for 1 minute to adl task OT Short Term Goal 2 (Week 1): Pt will demonstrate bed mobility HOB flat with min (A) OT Short Term Goal 3 (Week 1): Pt will complete toilet transfer Min (A)  OT Short Term Goal 4 (Week 1): pt will complete grooming task min verbal cues and min (A)  Skilled Therapeutic Interventions/Progress Updates:    1:1 self care retraining/ basic transfer/ shower transfer: Pt supine on arrival with KI off in bed complaining of RT knee , back and Rt shoulder pain. Total (A) to don KI. Pt required extensive encouragement to progress to eob. Water running at sink for sink level bathing. Pt progressed supine <>Sit on left side of bed min guard. Pt transferring EOB to w/c min (A) due to inability to maintain RT UE NWB without tactile input. Pt with sling don prior to transfer and pt using shoulder flexion to remove RT UE. Pt insisting on transfer into shower. Pt educated on need for sink level due to inability to place w/c in bathroom at home. Pt insist so Ot agreeable to trial shower transfer with w/c. Pt impulsive max (A) to transfer non complaint with RT UE NWB with sling and max cueing. OT holding RT UE off floor to prevent breaking weight bearing precautions. Pt sitting upright on shower seat with cueing from therapist. Pt holding shower head initially and allowed therapist to hold to decr AROM RT shoulder. Pt non complaint entire session with attempts to prevent RT UE AROM at shoulder. Pt c/o pain during washing and cued that pain is due to movement. Pt states "no - its fine" Pt transferring Lt into w/c from shower seat max (A) with KI don prior to attempt. Pt dressing in chair using RT UE . Pt attempting  to use bil UE to push w/c and disregarding therapist position to w/c. Pt attempting to roll over therapist foot to reach bed. Pt pushing therapist with RT UE. Pt becoming more agitated when therapist agreed to return to supine but on left side to encourage safer transfer. Pt unable to navigate w/c to left side and pulling on the bed end. Pt refusing to let therapist assist initially. RN called due to incr agitiated . Pt max (A) transferred to bed level using RT UE.  Pt demonstrates behavior consistent with [place] recovery IV   Therapy Documentation Precautions:  Precautions Precautions: Fall Precaution Comments: cognitive deficits Required Braces or Orthoses: Knee Immobilizer - Right;Sling Knee Immobilizer - Right: On at all times Restrictions Weight Bearing Restrictions: Yes RUE Weight Bearing: Non weight bearing (does not follow precautions) RLE Weight Bearing: Non weight bearing (does not follow precautions) Other Position/Activity Restrictions: R UE NWB as of 12/25/13 General: General Amount of Missed OT Time (min): 15 Minutes Missed Time Reason: Patient unwilling/refused to participate without medical reason  Pain: Pain Assessment Pain Assessment: 0-10 Reports pain RT UE RT LE and back- RN notified and reports medicated at 7 AM   See FIM for current functional status  Therapy/Group: Individual Therapy  Peri Maris 12/28/2013, 3:47 PM Pager: [phone]

## 2013-12-28 NOTE — Progress Notes (Signed)
Occupational Therapy Note  Patient Details  Name: James Hamilton MRN: [id number] Date of Birth: December 21, 1985 Today's Date: 12/28/2013  Pt missed 60 mins skilled OT services.  Pt initially agreed to participating in therapy but would not initiate any tasks and remained supine with eyes closed while therapist attempted to assist.  Pt resistant to assistance and repeatedly stated that he would get up in a minute.  Pt did not initiate any movement until therapist covered patient with covers.     Leotis Shames [facility] 12/28/2013, 1:35 PM

## 2013-12-28 NOTE — Progress Notes (Signed)
Physical Therapy Session Note  Patient Details  Name: James Hamilton MRN: [id number] Date of Birth: 1986/09/11  Today's Date: 12/28/2013 Time: 1115-1204 Time Calculation (min): 49 min  Short Term Goals: Week 1:  PT Short Term Goal 1 (Week 1): STGs=LTGs secondary to LOS  Skilled Therapeutic Interventions/Progress Updates:    Patient received semi-reclined in bed. Session focused on increasing activity tolerance, increasing participation, and cognitive remediation with set-up for transfers and wheelchair parts management. Patient very resistant to participating in therapy session, but able to be encouraged to get OOB and go to therapy gym. Donned R UE sling, which patient removes in order to push through R UE. Patient asked what he knows about his WB precautions and patient is able to verbalize NWB precautions and state possible repercussions/consequences if he is weightbearing through R UE/LE. However, when patient corrected in the moment when he attempts to bear weight through R limbs, patient states "I'm going to do it how I want to" and "it's easier to just put weight through my R arm and leg." Patient attempting to get up when wheelchair is approx 5' away from bed and requires PT facilitation to return to sitting to prevent a LOB or fall.  Patient requires minA for lateral/scoot transfers secondary to therapist holding R hand (in handshake) to prevent patient from weight bearing through R UE. At times, patient will push therapist's hand away so that he can weight bear through R UE. Wheelchair mobility in one arm drive 462'V0 with minA for negotiation of obstacles. Patient with poor frustration tolerance throughout, asking repeatedly to be pushed. In therapy gym, patient continues to lay down on mat despite being instructed to sit up. Opportunities presented for rest breaks, but patient continues to maintain laying down and resists when therapist attempts to assist him to sitting. Patient  continues to ask to go back to room, so in order to engage patient in remainder of therapy session, patient was instructed that he must direct every aspect of care for transferring mat>wheelchair, wheelchair parts management, and propulsion and wayfinding back to room. During this activity, patient continues to demonstrate unsafe and impulsive behaviors: attempting to transfer to wheelchair before it is close enough, not locking brakes, not removing leg rests or arm rests, etc. Once in chair, patient requires visual demo for donning of leg rests and patient demonstrating poor frustration tolerance with inability to don. Patient requires visual, verbal, and tactile cues as well as additional time, but is then able to don leg rests and properly place arm rest. In room, patient required to direct care for transfer to bed and manage wheelchair parts, which requires mod verbal cues to do so. Patient left supine in bed with 3 rails up, bed alarm on, and all needs within reach.  Therapy Documentation Precautions:  Precautions Precautions: Fall Precaution Comments: cognitive deficits Required Braces or Orthoses: Knee Immobilizer - Right;Sling Knee Immobilizer - Right: On at all times Restrictions Weight Bearing Restrictions: Yes RUE Weight Bearing: Non weight bearing (does not follow precautions) RLE Weight Bearing: Non weight bearing (does not follow precautions) Other Position/Activity Restrictions: R UE NWB as of 12/25/13 Pain: Pain Assessment Pain Assessment: 0-10 Pain Score: 8  Pain Type: Acute pain Pain Location: Leg Pain Orientation: Right Pain Descriptors / Indicators: Aching;Sore Pain Frequency: Intermittent Pain Onset: On-going Patients Stated Pain Goal: 3 Pain Intervention(s): RN made aware;Ambulation/increased activity;Repositioned Multiple Pain Sites: No Locomotion : Ambulation Ambulation/Gait Assistance: Not tested (comment) Wheelchair Mobility Distance: 120   See FIM for  current functional status  Therapy/Group: Individual Therapy  Lillia Abed. Anabela Crayton, PT, DPT 12/28/2013, 12:14 PM

## 2013-12-28 NOTE — Plan of Care (Signed)
Problem: RH Bed to Chair Transfers Goal: LTG Patient will perform bed/chair transfers w/assist (PT) LTG: Patient will perform bed/chair transfers with assistance, with/without cues (PT).  downgraded 12/28/13 secondary to inability to maintain NWB R UE/LE  Problem: RH Car Transfers Goal: LTG Patient will perform car transfers with assist (PT) LTG: Patient will perform car transfers with assistance (PT).  downgraded 12/28/13 secondary to inability to maintain NWB R UE/LE  Problem: RH Furniture Transfers Goal: LTG Patient will perform furniture transfers w/assist (OT/PT LTG: Patient will perform furniture transfers with assistance (OT/PT).  downgraded 12/28/13 secondary to inability to maintain NWB R UE/LE  Problem: RH Memory Goal: LTG Patient demonstrate ability for day to day recall (PT) LTG: Patient will demonstrate ability for day to day recall/carryover during mobility activities with assist (PT)  downgraded 12/28/13 secondary to inability to maintain NWB R UE/LE

## 2013-12-28 NOTE — Progress Notes (Signed)
Dr. Debroah Loop office called back--to continue sling and NO ROM right shoulder and follow up in a week for OV.

## 2013-12-28 NOTE — Progress Notes (Signed)
Patient ID: James Hamilton, male   DOB: [birth year], 28 y.o.   MRN: [id number] Asked to review CT head which shows evolving right frontal contusion with more edema. More shift of about 11 mm.  Concerned with some increased lethargy.  On exam patient arouses to voice opens eyes , engages in conversation and follows commands briskly.  Electrolytes : Na 134  Observe clinical status for now. Watch electrolytes particularly sodium. Do not want patient to become hyponatremic. Will discuss with Dr. Beatris Ship in AM.

## 2013-12-28 NOTE — Progress Notes (Signed)
Speech Language Pathology Daily Session Note  Patient Details  Name: James Hamilton MRN: [id number] Date of Birth: 27-Mar-1986  Today's Date: 12/28/2013 Time: 1000-1040 Time Calculation (min): 40 min  Short Term Goals: Week 1: SLP Short Term Goal 1 (Week 1): Pt will utilize call bell to request assistance with Mod question cues.  SLP Short Term Goal 2 (Week 1): Pt will demonstrate sustained attention to a functional task for 15 minutes with Mod A verbal cues for redirection  SLP Short Term Goal 3 (Week 1): Pt will recall new, daily information with Mod A multimodal cueing  SLP Short Term Goal 4 (Week 1): Pt will identify 2 cognitive impairments with Min question cues.  SLP Short Term Goal 5 (Week 1): Pt will utilize an increased vocal intensity at the phrase level with Min verbal cues.  SLP Short Term Goal 6 (Week 1): Pt will consume current diet with minimal overt s/s of aspiration with supervision verbal cues.   Skilled Therapeutic Interventions: Treatment focus on cognitive goals.  SLP facilitated session by providing total A verbal cues for sustained attention to a functional task for ~1 minute. Pt was distracted by pain and was restless in bed but declined to get up in a chair. Pt perseverative on pain and removing KI despite total A verbal cues for emergent awareness and problem solving.  Pt recalled all weight bearing precautions with Mod I but required total A to utilize them during bed mobility task.  Pt also utilized call bell appropriately to request pain medication.  Pt left in bed with alarm on and call bell within reach. Continue with current plan of care.    FIM:  Comprehension Comprehension Mode: Auditory Comprehension: 4-Understands basic 75 - 89% of the time/requires cueing 10 - 24% of the time Expression Expression Mode: Verbal Expression: 4-Expresses basic 75 - 89% of the time/requires cueing 10 - 24% of the time. Needs helper to occlude trach/needs to repeat  words. Social Interaction Social Interaction: 3-Interacts appropriately 50 - 74% of the time - May be physically or verbally inappropriate. Problem Solving Problem Solving: 2-Solves basic 25 - 49% of the time - needs direction more than half the time to initiate, plan or complete simple activities Memory Memory: 2-Recognizes or recalls 25 - 49% of the time/requires cueing 51 - 75% of the time  Pain Pain Assessment Pain Assessment: 0-10 Pain Score: 8  Pain Type: Acute pain Pain Location: Leg Pain Orientation: Right Pain Descriptors / Indicators: Aching;Sore Pain Frequency: Intermittent Pain Onset: On-going Patients Stated Pain Goal: 3 Pain Intervention(s): RN made aware;Ambulation/increased activity;Repositioned Multiple Pain Sites: No  Therapy/Group: Individual Therapy  James Hamilton 12/28/2013, 12:41 PM

## 2013-12-28 NOTE — Progress Notes (Signed)
CT results called to P. Love, PA . No new orders at this time . Continue with plan of care .             Mliss Sax

## 2013-12-28 NOTE — Progress Notes (Signed)
Mother has expressed concerns about sickle cell crises. Will order labs to check h/h as well as hydration status. Patient reports pain mostly in right shoulder and leg with pointed questioning and cues to stay awake.

## 2013-12-28 NOTE — Progress Notes (Signed)
Subjective/Complaints: Pt up all night per RN. Asked pt who states his right leg hurt. RN reports a lot of restlessness and agitation.  A 12 point review of systems has been performed and if not noted above is otherwise negative.   Objective: Vital Signs: Blood pressure 119/63, pulse 72, temperature 97.9 F (36.6 C), temperature source Oral, resp. rate 19, weight 66.497 kg (146 lb 9.6 oz), SpO2 98.00%. No results found. No results found for this basename: WBC, HGB, HCT, PLT,  in the last 72 hours No results found for this basename: NA, K, CL, CO, GLUCOSE, BUN, CREATININE, CALCIUM,  in the last 72 hours CBG (last 3)  No results found for this basename: GLUCAP,  in the last 72 hours  Wt Readings from Last 3 Encounters:  12/23/13 66.497 kg (146 lb 9.6 oz)  12/17/13 70.3 kg (154 lb 15.7 oz)  12/17/13 70.3 kg (154 lb 15.7 oz)    Physical Exam:  General: Awake, a little restless in bed. KI on HEENT: Head is normocephalic, atraumatic, PERRLA, EOMI, sclera anicteric, oral mucosa pink and moist, dentition intact, ext ear canals clear,  Neck: Supple without JVD or lymphadenopathy  Heart: Reg rate and rhythm. No murmurs rubs or gallops  Chest: CTA bilaterally without wheezes, rales, or rhonchi; no distress  Abdomen: Soft, non-tender, non-distended, bowel sounds positive.  Extremities: No clubbing, cyanosis, or edema. Pulses are 2+  Skin: Clean and intact without signs of breakdown. Right leg incisions all intact without drainage. Neuro: more alert, still distracted.   Answers basic questions regarding living situation.   Moves all 4's except for right leg. Senses pain in all 4's. 3-/5 R delt, 4/5 R bi, tri, grip, 5/5 in LUE and LLE, 2- R hip flex, 3/5 ankle Musculoskeletal: right leg in KI with ACE wrap over lower portion of leg.  Psych: Pt's affect is flat,    Assessment/Plan: 1. Functional deficits secondary to TBI, right femur,tibial plateau fx's which require 3+ hours per  day of interdisciplinary therapy in a comprehensive inpatient rehab setting. Physiatrist is providing close team supervision and 24 hour management of active medical problems listed below. Physiatrist and rehab team continue to assess barriers to discharge/monitor patient progress toward functional and medical goals.  FIM: FIM - Bathing Bathing Steps Patient Completed: Chest;Right Arm;Left Arm;Abdomen;Front perineal area;Right upper leg;Left upper leg Bathing: 0: Activity did not occur  FIM - Upper Body Dressing/Undressing Upper body dressing/undressing steps patient completed: Thread/unthread right sleeve of pullover shirt/dresss;Thread/unthread left sleeve of pullover shirt/dress;Put head through opening of pull over shirt/dress;Pull shirt over trunk Upper body dressing/undressing: 5: Set-up assist to: Obtain clothing/put away FIM - Lower Body Dressing/Undressing Lower body dressing/undressing steps patient completed: Thread/unthread left underwear leg;Pull underwear up/down;Thread/unthread left pants leg;Pull pants up/down Lower body dressing/undressing: 2: Max-Patient completed 25-49% of tasks  FIM - Toileting Toileting steps completed by patient: Performs perineal hygiene Toileting Assistive Devices: Grab bar or rail for support Toileting: 2: Max-Patient completed 1 of 3 steps  FIM - Radio producer Devices: Walker;Elevated toilet seat;Grab bars Toilet Transfers: 3-To toilet/BSC: Mod A (lift or lower assist);3-From toilet/BSC: Mod A (lift or lower assist)  FIM - Engineer, site Assistive Devices: Arm rests Bed/Chair Transfer: 4: Chair or W/C > Bed: Min A (steadying Pt. > 75%);1: Bed > Chair or W/C: Total A (helper does all/Pt. < 25%)  FIM - Locomotion: Wheelchair Distance: 120 Locomotion: Wheelchair: 2: [place] ft  with minimal assistance (Pt.>75%) FIM - Locomotion: Ambulation Locomotion: Ambulation Assistive Devices:  Walker - Rolling Ambulation/Gait Assistance: Not tested (comment) Locomotion: Ambulation: 0: Activity did not occur  Comprehension Comprehension Mode: Auditory Comprehension: 4-Understands basic 75 - 89% of the time/requires cueing 10 - 24% of the time  Expression Expression Mode: Verbal Expression: 4-Expresses basic 75 - 89% of the time/requires cueing 10 - 24% of the time. Needs helper to occlude trach/needs to repeat words.  Social Interaction Social Interaction: 3-Interacts appropriately 50 - 74% of the time - May be physically or verbally inappropriate.  Problem Solving Problem Solving: 3-Solves basic 50 - 74% of the time/requires cueing 25 - 49% of the time  Memory Memory: 2-Recognizes or recalls 25 - 49% of the time/requires cueing 51 - 75% of the time  Medical Problem List and Plan:  TBI with skull fracture, left femural neck fracture with compression screw and left femoral shaft fracture with IM nail--NWB LLE.     1. DVT Prophylaxis/Anticoagulation: Mechanical: Sequential compression devices, below knee Bilateral lower extremities  2. Pain Management: Has intermittent pain with sickle cell crises. Will continue oxycodone prn.  3. Mood: Internally distracted with poor insight and impaired awareness. Will monitor as mentation improves. LCSW to follow for evaluation and support.  - ritalin---decreased to 5mg --hold there for now 4. Neuropsych: This patient is not capable of making decisions on his own behalf.  5. Acute on chronic anemia/ H/o SS anemia : Hgb up to 9.6  6. Hypokalemia: 3.4, continue supplementation 7. Occult intra-articular fracture of the lateral tibial plateau with intercondylar extension and hemarthrosis and probable MCL strain: Continue NWB, KI. May open KI  for 1 hour per day, only if supervised  8. Constipation:  bm's yesterday 9. Right shoulder---proximal, greater tuberosity humerus fx's, sling, no ROM  -continue POC, ortho in agreement.  LOS (Days) 7 A  FACE TO FACE EVALUATION WAS PERFORMED  Bonnie Roig T 12/28/2013 8:20 AM

## 2013-12-28 NOTE — Progress Notes (Signed)
Frequently removing KI after multi requests to leave in place. Patient reports, "they" said I could leave it off. Reminded of Doctor's orders to leave KI in place. Patient has continued to remove. 2 vicodin given at 2232 and 0232, for complaint of RLE and back pain. Prn robaxin given at 0149. Trazodone 50mg  given at 2232. Multi attempts up without assistance, bed alarm in use and alerting staff. At 0200,  bed alarm sounding, patient standing at EOB holding on to bedside table. Table started moving and patient sat down on bed quickly with staff assistance. KI on floor and gripper socks were removed. Applied gripper socks and reapplied KI. Assist patient to BR, getting out of WC without locking brakes or before staff able to lock. Continent BM and void in BR. James Hamilton A

## 2013-12-28 NOTE — Progress Notes (Signed)
Awake all night, attempting OOB, to nurses's station with patient requesting back to bed within 5 minutes. James Hamilton

## 2013-12-28 NOTE — Progress Notes (Signed)
Physical Therapy Session Note  Patient Details  Name: Chaynce Schafer MRN: [id number] Date of Birth: 1985-11-29  Today's Date: 12/28/2013 Time: 7076-1518 Time Calculation (min): 15 min  Skilled Therapeutic Interventions/Progress Updates:  1:1. Pt received supine in bed w/ EC, req max verbal cues to open eyes. Therapist providing max encouragement x37min to get pt OOB. Pt repeatedly resisting stating, "I don't care that I stay here, I don't care that I lie in bed all day." Increased irritation noted w/ continued trials to get pt OOB. Pt supine in bed at end of session w/ all needs in reach, bed alarm on.   Therapy Documentation Precautions:  Precautions Precautions: Fall Precaution Comments: cognitive deficits Required Braces or Orthoses: Knee Immobilizer - Right;Sling Knee Immobilizer - Right: On at all times Restrictions Weight Bearing Restrictions: Yes RUE Weight Bearing: Non weight bearing (does not follow precautions) RLE Weight Bearing: Non weight bearing (does not follow precautions) Other Position/Activity Restrictions: R UE NWB as of 12/25/13 General: Amount of Missed PT Time (min): 15 Minutes Missed Time Reason: Patient unwilling/refused to participate without medical reason  See FIM for current functional status  Therapy/Group: Individual Therapy  Gilmore Laroche 12/28/2013, 2:18 PM

## 2013-12-29 ENCOUNTER — Encounter (HOSPITAL_COMMUNITY): Payer: PRIVATE HEALTH INSURANCE | Admitting: Occupational Therapy

## 2013-12-29 ENCOUNTER — Inpatient Hospital Stay (HOSPITAL_COMMUNITY): Payer: PRIVATE HEALTH INSURANCE | Admitting: Speech Pathology

## 2013-12-29 ENCOUNTER — Inpatient Hospital Stay (HOSPITAL_COMMUNITY): Payer: Medicaid Other

## 2013-12-29 ENCOUNTER — Inpatient Hospital Stay (HOSPITAL_COMMUNITY): Payer: Medicaid Other | Admitting: Physical Therapy

## 2013-12-29 ENCOUNTER — Inpatient Hospital Stay (HOSPITAL_COMMUNITY): Payer: PRIVATE HEALTH INSURANCE | Admitting: *Deleted

## 2013-12-29 LAB — CBC WITH DIFFERENTIAL/PLATELET
BASOS ABS: 0 10*3/uL (ref 0.0–0.1)
Basophils Relative: 0 % (ref 0–1)
Eosinophils Absolute: 0.1 10*3/uL (ref 0.0–0.7)
Eosinophils Relative: 1 % (ref 0–5)
HEMATOCRIT: 30.2 % — AB (ref 39.0–52.0)
Hemoglobin: 10.1 g/dL — ABNORMAL LOW (ref 13.0–17.0)
LYMPHS ABS: 1.5 10*3/uL (ref 0.7–4.0)
LYMPHS PCT: 10 % — AB (ref 12–46)
MCH: 25.1 pg — ABNORMAL LOW (ref 26.0–34.0)
MCHC: 33.4 g/dL (ref 30.0–36.0)
MCV: 75.1 fL — ABNORMAL LOW (ref 78.0–100.0)
Monocytes Absolute: 0.9 10*3/uL (ref 0.1–1.0)
Monocytes Relative: 6 % (ref 3–12)
NEUTROS ABS: 11.9 10*3/uL — AB (ref 1.7–7.7)
Neutrophils Relative %: 83 % — ABNORMAL HIGH (ref 43–77)
PLATELETS: 513 10*3/uL — AB (ref 150–400)
RBC: 4.02 MIL/uL — ABNORMAL LOW (ref 4.22–5.81)
RDW: 16.7 % — ABNORMAL HIGH (ref 11.5–15.5)
WBC: 14.3 10*3/uL — AB (ref 4.0–10.5)

## 2013-12-29 LAB — BASIC METABOLIC PANEL
BUN: 14 mg/dL (ref 6–23)
CALCIUM: 9.9 mg/dL (ref 8.4–10.5)
CHLORIDE: 92 meq/L — AB (ref 96–112)
CO2: 27 meq/L (ref 19–32)
Creatinine, Ser: 0.92 mg/dL (ref 0.50–1.35)
GFR calc Af Amer: >90 mL/min (ref >90)
GFR calc non Af Amer: >90 mL/min (ref >90)
GLUCOSE: 80 mg/dL (ref 70–99)
Potassium: 4.1 mEq/L (ref 3.7–5.3)
SODIUM: 135 meq/L — AB (ref 137–147)

## 2013-12-29 LAB — HEPATIC FUNCTION PANEL
ALT: 41 U/L (ref 0–53)
AST: 30 U/L (ref 0–37)
Albumin: 3.5 g/dL (ref 3.5–5.2)
Alkaline Phosphatase: 129 U/L — ABNORMAL HIGH (ref 39–117)
Bilirubin, Direct: <0.2 mg/dL (ref 0.0–0.3)
TOTAL PROTEIN: 8.1 g/dL (ref 6.0–8.3)
Total Bilirubin: 0.8 mg/dL (ref 0.3–1.2)

## 2013-12-29 LAB — URINE CULTURE
Colony Count: NO GROWTH
Culture: NO GROWTH

## 2013-12-29 LAB — RETICULOCYTES
RBC.: 4.26 MIL/uL (ref 4.22–5.81)
RETIC COUNT ABSOLUTE: 183.2 10*3/uL (ref 19.0–186.0)
RETIC CT PCT: 4.3 % — AB (ref 0.4–3.1)

## 2013-12-29 LAB — LACTATE DEHYDROGENASE: LDH: 486 U/L — AB (ref 94–250)

## 2013-12-29 MED ORDER — DEXTROSE-NACL 5-0.45 % IV SOLN
INTRAVENOUS | Status: DC
  Administered 2013-12-29 – 2013-12-30 (×2): via INTRAVENOUS

## 2013-12-29 MED ORDER — FENTANYL 12 MCG/HR TD PT72
12.5000 ug | MEDICATED_PATCH | TRANSDERMAL | Status: DC
  Administered 2013-12-29: 12.5 ug via TRANSDERMAL
  Filled 2013-12-29: qty 1

## 2013-12-29 MED ORDER — OXYCODONE HCL 5 MG PO TABS
5.0000 mg | ORAL_TABLET | ORAL | Status: DC
  Administered 2013-12-29 – 2013-12-31 (×8): 5 mg via ORAL
  Administered 2013-12-31: 10 mg via ORAL
  Filled 2013-12-29 (×9): qty 1

## 2013-12-29 MED ORDER — MORPHINE SULFATE 2 MG/ML IJ SOLN
2.0000 mg | INTRAMUSCULAR | Status: DC | PRN
  Administered 2013-12-29 – 2013-12-30 (×6): 2 mg via INTRAVENOUS
  Filled 2013-12-29 (×6): qty 1

## 2013-12-29 MED ORDER — LIDOCAINE 5 % EX PTCH
2.0000 | MEDICATED_PATCH | Freq: Every day | CUTANEOUS | Status: DC
  Administered 2013-12-29 – 2014-01-11 (×14): 2 via TRANSDERMAL
  Filled 2013-12-29 (×16): qty 2

## 2013-12-29 NOTE — Progress Notes (Signed)
Physical Therapy Session Note  Patient Details  Name: James Hamilton MRN: [id number] Date of Birth: 30-Sep-1986  Today's Date: 12/29/2013 Time: 1425-1440 Time Calculation (min): 15 min  Short Term Goals: Week 1:  PT Short Term Goal 1 (Week 1): STGs=LTGs secondary to LOS  Skilled Therapeutic Interventions/Progress Updates:    Pt received semi-reclined in bed. With max coaxing, pt agreeable to therapy. Donned R KI. Pt refused to wear R arm sling. Pt able to verbalize restrictions and rationale for nonweightbearing but continues to fail to adhere to precautions. Supine>sit with min A for RLE management, max verbal cueing to adhere to RUE weightbearing restrictions. Stand pivot transfer from bed<>w/c with min A, max verbal cueing for safety awareness and for adherence to RUE restrictions.  Pt refuses to participate in remaining 45 minutes of skilled physical therapy without medical reason. Therapist departed pt room with pt semi-reclined in bed with 3 bed rails up, bed alarm on, and all needs within reach. RN notified of pt refusal and pain.  Therapy Documentation Precautions:  Precautions Precautions: Fall Precaution Comments: cognitive deficits Required Braces or Orthoses: Knee Immobilizer - Right;Sling Knee Immobilizer - Right: On at all times Restrictions Weight Bearing Restrictions: Yes RUE Weight Bearing: Non weight bearing (does not follow precautions) RLE Weight Bearing: Non weight bearing Other Position/Activity Restrictions: R UE NWB as of 12/25/13 General: Amount of Missed PT Time (min): 45 Minutes Missed Time Reason: Patient unwilling/refused to participate without medical reason Pain: Pain Assessment Pain Assessment: 0-10 Pain Score: 8  Pain Type: Acute pain Pain Location: Leg Pain Orientation: Right;Upper Pain Descriptors / Indicators: Aching Pain Onset: On-going Pain Intervention(s): Repositioned; RN made aware Multiple Pain Sites: No Locomotion  : Ambulation Ambulation/Gait Assistance: Not tested (comment)   See FIM for current functional status  Therapy/Group: Individual Therapy  Hobble, Malva Cogan 12/29/2013, 3:39 PM

## 2013-12-29 NOTE — Progress Notes (Signed)
Pt c/o constant pain throughout day. Prn norco given alongside other scheduled pain interventions. Nurse attempted to comfort patient, very impulsive and agitated with staff. Comfort measures initiated, pt put in bed, lights dimmed, room temp increased, heating pads provided per pt request, and fluids offered periodically throughout shift. Family presently at bedside and pt sleeping. IV fluids started and scheduled pain medication to be given. Will continue plan of care.

## 2013-12-29 NOTE — Progress Notes (Signed)
Inpatient [facility] Individual Statement of Services  Patient Name:  James Hamilton  Date:  12/23/2013  Welcome to the [facility].  Our goal is to provide you with an individualized program based on your diagnosis and situation, designed to meet your specific needs.  With this comprehensive rehabilitation program, you will be expected to participate in at least 3 hours of rehabilitation therapies Monday-Friday, with modified therapy programming on the weekends.  Your rehabilitation program will include the following services:  Physical Therapy (PT), Occupational Therapy (OT), Speech Therapy (ST), 24 hour per day rehabilitation nursing, Therapeutic Recreaction (TR), Neuropsychology, Case Management (Social Worker), Rehabilitation Medicine, Nutrition Services and Pharmacy Services  Weekly team conferences will be held on Tuesdays to discuss your progress.  Your Social Worker will talk with you frequently to get your input and to update you on team discussions.  Team conferences with you and your family in attendance may also be held.  Expected length of stay: 10-14 days  Overall anticipated outcome: minimal assist  Depending on your progress and recovery, your program may change. Your Social Worker will coordinate services and will keep you informed of any changes. Your Social Worker's name and contact numbers are listed  below.  The following services may also be recommended but are not provided by the [facility] will be made to provide these services after discharge if needed.  Arrangements include referral to agencies that provide these services.  Your insurance has been verified to be:  Medicaid (covers Sickle Cell events only) Your primary doctor is:  None established currently  Pertinent  information will be shared with your doctor and your insurance company.  Social Worker:  [place], [place] or ([phone]   Information discussed with and copy given to patient by: Lennart Pall, 12/23/2013, 2:05 PM
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
## 2013-12-29 NOTE — Progress Notes (Addendum)
Occupational Therapy Weekly Progress Note  Patient Details  Name: James Hamilton MRN: [id number] Date of Birth: [birth year]  Today's Date: 12/29/2013 Time: 0900-1004 Time Calculation (min): 64 min  Patient has met 0 of 4 short term goals.  Pt progressing toward goals but has not met any goals at this point. Pt with new orders for Rt UE NWB so LTG downgraded or discontinued.   Patient continues to demonstrate the following deficits: balance, cogntiive, muscle weakness and therefore will continue to benefit from skilled OT intervention to enhance overall performance with BADL and Reduce care partner burden.  Patient not progressing toward long term goals.  See goal revision..  Continue plan of care. Pt continues to require Max-total A for initiation, sustained attention, intellectual awareness, safety awareness and recall of newly learned information. Goals downgraded to minA overall secondary to patient's inability to adhere to NWB precautions for RU/LE. Caregivers will likely have to provide minA in order to assist patient in maintaining NWB precautions.. Pt with LB goals revised to MOD (A) and recommending bed level due to decline with upright sitting at sink.  OT Short Term Goals Week 1:  OT Short Term Goal 1 (Week 1): pt will demonstrate sustain attention for 1 minute to adl task OT Short Term Goal 1 - Progress (Week 1): Progressing toward goal OT Short Term Goal 2 (Week 1): Pt will demonstrate bed mobility HOB flat with min (A) OT Short Term Goal 2 - Progress (Week 1): Progressing toward goal (needs cues for NWB RT UE) OT Short Term Goal 3 (Week 1): Pt will complete toilet transfer Min (A)  OT Short Term Goal 3 - Progress (Week 1): Progressing toward goal OT Short Term Goal 4 (Week 1): pt will complete grooming task min verbal cues and min (A) OT Short Term Goal 4 - Progress (Week 1): Progressing toward goal Week 2:  OT Short Term Goal 1 (Week 2): WEEK 1 GOALS REMAIN  APPROPRIATE  Skilled Therapeutic Interventions/Progress Updates:    1:1 self care retraining/ static sitting balance/ bed mobility: pt supine on arrival and needed max encouragement for OOB. Pt with w/c sat on Lt side of bed with w/c locked. OT locking bed controls to require pt to complete bed mobility from flat HOB. Pt using RT UE with max encouragement and attempting to hold RT UE adducted to body core. Pt resistant to therapist guarding. Pt completing bed <>w/c min guard (A) with RT UE use. Pt total (A) to position w/c at sink level. Pt washing face and perseverating on return to bed and incr pain in back. RN entering room with medication. Pt need max encouragement to remain at sink level sitting to take medication. Pt pulling on walls and w/c pushing LT LE toward bed. Ot agreed to bed level bathing due to patients incr agitation. Pt unable to problem solve how to position w/c without max (A). Pt completing transfer max (A) and using RT UE with sling on. MD arriving to address patient. MD okay with ace bandage over sling to restrict RT UE AROM / assisting. Pt talking to therapist during MD addressing patient and educating on RT UE. Ot redirecting patient to MD education session. Pt insisting on clothing and assistance with changing disregarding MD Naaman Plummer. Pt total (A) with LB dressing. Pt using RT UE to bridge in bed with max cues to avoid. Pt insisting on heat on Rt LE to continue therapy. OT agreeable only if patient completes w/c transfer. Pt needed max encouragement to  keep this behavioral plan. Pt at eob with ace wrap applied to sling to prevent shoulder flexion. Pt transferred mod (A). Pt agreeable to gym mat transfer for moist heat. Pt transferred mod (A). Pt falling asleep on mat with heat Rt LE & Ue. Pt needed max encouragement to transfer to w/c. Pt transferring min (A) with max cueing. Pt returned to room self propelling with min (A). Pt transferred w/c to bed with urgency MOD (A).   POC room  goals updated: 3n1 transfer for toileting/ sink level bathing only in prep for home  Therapy Documentation Precautions:  Precautions Precautions: Fall Precaution Comments: cognitive deficits Required Braces or Orthoses: Knee Immobilizer - Right;Sling Knee Immobilizer - Right: On at all times Restrictions Weight Bearing Restrictions: Yes RUE Weight Bearing: Non weight bearing (does not follow precautions) RLE Weight Bearing: Non weight bearing Other Position/Activity Restrictions: R UE NWB as of 12/25/13 General: General Missed Time Reason: Patient unwilling/refused to participate without medical reason Vital Signs:   Pain: Pain Assessment Pain Assessment: 0-10 Pain Score: 8  Back pain Provided medication by RN during session  See FIM for current functional status  Therapy/Group: Individual Therapy  Peri Maris 12/29/2013, 3:35 PM  Pager: [phone]

## 2013-12-29 NOTE — Progress Notes (Signed)
Discussed patient with Dr. Zigmund Daniel who felt that patient was likely in crises. Labs ordered and pending. No long acting pain Rx when in crises--PCA most effective but could schedule oxycodone 5 mg every 4 hours with IV morphine prn 2 hours. Also start D5 1/2 Nacl for hydration X 1 liter. She'll follow up for formal consult in am.

## 2013-12-29 NOTE — Plan of Care (Signed)
Problem: RH Bathing Goal: LTG Patient will bathe with assist, cues/equipment (OT) LTG: Patient will bathe specified number of body parts with assist with/without cues using equipment (position) (OT)  Goal downgraded due to Rt UE NWB and restricted shoulder AROM

## 2013-12-29 NOTE — Progress Notes (Signed)
Subjective/Complaints: Pt had a better night apparently. Pain remains a problem. Complains of increased low back pain particular this am. A 12 point review of systems has been performed and if not noted above is otherwise negative.   Objective: Vital Signs: Blood pressure 136/68, pulse 68, temperature 98 F (36.7 C), temperature source Oral, resp. rate 16, weight 66.497 kg (146 lb 9.6 oz), SpO2 100.00%. Ct Head Wo Contrast  12/28/2013   CLINICAL DATA:  Lethargy. Traumatic brain injury. Follow-up hemorrhagic contusions in midline shift.  EXAM: CT HEAD WITHOUT CONTRAST  TECHNIQUE: Contiguous axial images were obtained from the base of the skull through the vertex without intravenous contrast.  COMPARISON:  12/19/2013  FINDINGS: Again seen are hemorrhagic contusions involving the anterior right frontal lobe, right greater than left inferior frontal lobes, and high right frontal lobe near the vertex. Overall, the areas of hemorrhage demonstrated interval evolution in density of blood products. Confluent edema in the right frontal lobe appears to have increased slightly from the prior study, and there is now 12 mm of leftward midline shift (previously 10 mm). Left inferior frontal lobe edema may have also minimally increased. The right lateral ventricle remains effaced. There is slightly increased dilatation of the atrium and temporal horn of the left lateral ventricle. Basilar cisterns remain patent. Prior right-sided subdural hematoma is no longer definitely identified.  Right-sided scalp hematoma has decreased. Orbits are unremarkable. Nondisplaced left occipital bone fracture is again noted with mild diastasis of the left lambdoid suture. Mastoid air cells are clear. Left sphenoid sinus mucosal thickening/fluid has improved.  IMPRESSION: 1. Evolving right temporal and right greater than left frontal lobe hemorrhagic contusions. 2. Edema in the right frontal lobe has mildly increased from the  prior study, and there is slightly increased leftward midline shift of 12 mm (previously 10 mm). 3. Slightly increased size of the atrium and temporal horn of the left lateral ventricle, which could represent early trapping due to midline shift.   Electronically Signed   By: Logan Bores   On: 12/28/2013 18:37    Recent Labs  12/28/13 1303 12/29/13 0550  WBC 17.8* 14.3*  HGB 10.1* 10.1*  HCT 29.2* 30.2*  PLT 503* 513*    Recent Labs  12/28/13 1303  NA 134*  K 4.3  CL 94*  GLUCOSE 139*  BUN 13  CREATININE 0.86  CALCIUM 9.7   CBG (last 3)  No results found for this basename: GLUCAP,  in the last 72 hours  Wt Readings from Last 3 Encounters:  12/23/13 66.497 kg (146 lb 9.6 oz)  12/17/13 70.3 kg (154 lb 15.7 oz)  12/17/13 70.3 kg (154 lb 15.7 oz)    Physical Exam:  General: Awake, NAD HEENT: Head is normocephalic, atraumatic, PERRLA, EOMI, sclera anicteric, oral mucosa pink and moist, dentition intact, ext ear canals clear,  Neck: Supple without JVD or lymphadenopathy  Heart: Reg rate and rhythm. No murmurs rubs or gallops  Chest: CTA bilaterally without wheezes, rales, or rhonchi; no distress  Abdomen: Soft, non-tender, non-distended, bowel sounds positive.  Extremities: No clubbing, cyanosis, or edema. Pulses are 2+  Skin: Clean and intact without signs of breakdown. Right leg incisions all intact without drainage. Neuro: more alert, still distracted.   Answers basic questions regarding living situation.   Moves all 4's except for right leg. Senses pain in all 4's. 3-/5 R delt, 4/5 R bi, tri, grip, 5/5 in LUE and LLE, 2- R hip flex,  3/5 ankle Musculoskeletal: right leg in KI with ACE wrap over lower portion of leg. Pain with palpation over lower lumbar spine---L4-5 region, and associated paraspinals. Tender in sitting and supine. Psych: Pt's affect is flat, remains distracted. Can be redirected   Assessment/Plan: 1. Functional deficits secondary to TBI, right  femur,tibial plateau fx's which require 3+ hours per day of interdisciplinary therapy in a comprehensive inpatient rehab setting. Physiatrist is providing close team supervision and 24 hour management of active medical problems listed below. Physiatrist and rehab team continue to assess barriers to discharge/monitor patient progress toward functional and medical goals.  FIM: FIM - Bathing Bathing Steps Patient Completed: Chest;Right Arm;Left Arm;Abdomen;Front perineal area;Right upper leg;Left upper leg Bathing: 0: Activity did not occur  FIM - Upper Body Dressing/Undressing Upper body dressing/undressing steps patient completed: Thread/unthread right sleeve of pullover shirt/dresss;Thread/unthread left sleeve of pullover shirt/dress;Put head through opening of pull over shirt/dress;Pull shirt over trunk Upper body dressing/undressing: 5: Set-up assist to: Obtain clothing/put away FIM - Lower Body Dressing/Undressing Lower body dressing/undressing steps patient completed: Thread/unthread left underwear leg;Pull underwear up/down;Thread/unthread left pants leg;Pull pants up/down Lower body dressing/undressing: 2: Max-Patient completed 25-49% of tasks  FIM - Toileting Toileting steps completed by patient: Performs perineal hygiene Toileting Assistive Devices: Grab bar or rail for support Toileting: 2: Max-Patient completed 1 of 3 steps  FIM - Radio producer Devices: Walker;Elevated toilet seat;Grab bars Toilet Transfers: 3-To toilet/BSC: Mod A (lift or lower assist);3-From toilet/BSC: Mod A (lift or lower assist)  FIM - Bed/Chair Transfer Bed/Chair Transfer Assistive Devices: Arm rests;Bed rails Bed/Chair Transfer: 5: Supine > Sit: Supervision (verbal cues/safety issues);4: Bed > Chair or W/C: Min A (steadying Pt. > 75%);4: Chair or W/C > Bed: Min A (steadying Pt. > 75%)  FIM - Locomotion: Wheelchair Distance: 120 Locomotion: Wheelchair: 2: Travels 50 - 149  ft with minimal assistance (Pt.>75%) FIM - Locomotion: Ambulation Locomotion: Ambulation Assistive Devices: Administrator Ambulation/Gait Assistance: Not tested (comment) Locomotion: Ambulation: 0: Activity did not occur  Comprehension Comprehension Mode: Auditory Comprehension: 4-Understands basic 75 - 89% of the time/requires cueing 10 - 24% of the time  Expression Expression Mode: Verbal Expression: 4-Expresses basic 75 - 89% of the time/requires cueing 10 - 24% of the time. Needs helper to occlude trach/needs to repeat words.  Social Interaction Social Interaction: 3-Interacts appropriately 50 - 74% of the time - May be physically or verbally inappropriate.  Problem Solving Problem Solving: 2-Solves basic 25 - 49% of the time - needs direction more than half the time to initiate, plan or complete simple activities  Memory Memory: 2-Recognizes or recalls 25 - 49% of the time/requires cueing 51 - 75% of the time  Medical Problem List and Plan:  TBI with skull fracture, left femural neck fracture with compression screw and left femoral shaft fracture with IM nail--NWB LLE.     1. DVT Prophylaxis/Anticoagulation: Mechanical: Sequential compression devices, below knee Bilateral lower extremities  2. Pain Management: Has intermittent pain with sickle cell crises. Will continue oxycodone prn.  Add low dose fentanyl patch today 3. Mood: Internally distracted with poor insight and impaired awareness. Will monitor as mentation improves. LCSW to follow for evaluation and support.  - ritalin---continue at 5mg  -seroquel to restore sleep cycle 4. Neuropsych: This patient is not capable of making decisions on his own behalf.  5. Acute on chronic anemia/ H/o SS anemia : Hgb up to 9.6  6. Hypokalemia: 3.4, continue supplementation 7. Occult intra-articular fracture of  the lateral tibial plateau with intercondylar extension and hemarthrosis and probable MCL strain: Continue NWB, KI. May open  KI  for 1 hour per day, only if supervised  8. Constipation:  bm's yesterday 9. Right shoulder---proximal, greater tuberosity humerus fx's, sling, no ROM  -continue POC, ortho in agreement. 10. Low back pain. xrays ordered today  -lidoderm patches 11. Sickle Cell disease: ensure adequate hydration, pain control 12. Lethargy: may be largely sleep related  -CT shows increased shift--appreciate NS follow up  -daily BMET to follow sodium  LOS (Days) 8 A FACE TO FACE EVALUATION WAS PERFORMED  Taimi Towe T 12/29/2013 10:33 AM

## 2013-12-29 NOTE — Progress Notes (Signed)
Updated mother on Xray results as well as refusal to participate with therapy consistently. She expressed concerns that his aggression was due to severe pain from SS crises. Historically this is the behavior he portrays when crises occurs--last was about 18 months ago. He was to set up with clinic at [facility] but had to cancel due to timing conflict and never rescheduled. He has not had any narcotics since last Rx for crises. He usually uses advil/ibuprofen at home as well as a heating pad. She relayed that lower back, RLE especially his thigh and ankle were joints most affected during these times.

## 2013-12-29 NOTE — Progress Notes (Signed)
Nursing Note: Vitals stable. Requested pain meds.Medicated for pain and fluids provided.wbb

## 2013-12-29 NOTE — Progress Notes (Signed)
Speech Language Pathology Weekly Progress and Session Note  Patient Details  Name: James Hamilton MRN: [id number] Date of Birth: [birth year]  Today's Date: 12/29/2013  Short Term Goals: Week 1: SLP Short Term Goal 1 (Week 1): Pt will utilize call bell to request assistance with Mod question cues.  SLP Short Term Goal 1 - Progress (Week 1): Met SLP Short Term Goal 2 (Week 1): Pt will demonstrate sustained attention to a functional task for 15 minutes with Mod A verbal cues for redirection  SLP Short Term Goal 2 - Progress (Week 1): Not met SLP Short Term Goal 3 (Week 1): Pt will recall new, daily information with Mod A multimodal cueing  SLP Short Term Goal 3 - Progress (Week 1): Not met SLP Short Term Goal 4 (Week 1): Pt will identify 2 cognitive impairments with Min question cues.  SLP Short Term Goal 4 - Progress (Week 1): Not met SLP Short Term Goal 5 (Week 1): Pt will utilize an increased vocal intensity at the phrase level with Min verbal cues.  SLP Short Term Goal 5 - Progress (Week 1): Met SLP Short Term Goal 6 (Week 1): Pt will consume current diet with minimal overt s/s of aspiration with supervision verbal cues.  SLP Short Term Goal 6 - Progress (Week 1): Not met    New Short Term Goals: Week 2: SLP Short Term Goal 1 (Week 2): Pt will consume current diet with minimal overt s/s of aspiration with Mod verbal cues for utilization of swallowing compensatory strategies.  SLP Short Term Goal 2 (Week 2): Pt will identify 2 cognitive impairments with Mod question cues.  SLP Short Term Goal 3 (Week 2): Pt will recall new, daily information with Mod A multimodal cueing  SLP Short Term Goal 4 (Week 2): Pt will demonstrate sustained attention to a functional task for 5 minutes with Max A verbal cues for redirection  SLP Short Term Goal 5 (Week 2): Pt will utilize call bell to request assistance with supervision question cues.   Weekly Progress Updates: Pt has made minimal gains and has  met 2 of 6 STG's this reporting period due to increased vocal intensity and increased ability to utilize the call bell to request wants/needs.  Patient was making functional gains in initial days on rehab. However, in recent days, patient demonstrating increased resistance with therapy as well as other staff members with regards to all aspects of care. Patient with continued decreased participation as well as non-acceptance of precautions, impulsivity, poor safety awareness, and poor motivation to improve. Patient demonstrating intermittent mild verbal and physical bouts of agitation, i.e., pushing therapists/staff members away when assisting, removing KI and sling immediately after staff member dons, non-adherence to precautions, etc. Pt continues to require Max-total A for initiation, sustained attention, intellectual awareness, safety awareness and recall of newly learned information.  Pt is currently consuming regular textures with thin liquids with intermittent overt s/s of aspiration noted due to impulsivity with self-feeding. Patient not progressing toward long term goals, therefore, long-term goals were revised and downgraded to overall Mod A.  Pt would benefit from continued skilled SLP intervention to maximize swallowing and cognitive function in order to maximize pt's overall safety and functional independence prior to discharge home.    Intensity: Minumum of 1-2 x/day, 30 to 90 minutes Frequency: 5 out of 7 days Duration/Length of Stay: TBD Treatment/Interventions: Cognitive remediation/compensation;Cueing hierarchy;Functional tasks;Environmental controls;Internal/external aids;Dysphagia/aspiration precaution training;Patient/family education;Therapeutic Activities;Speech/Language facilitation   Daily Session Skilled Therapeutic Interventions: Pt missed 60  minutes of skilled SLP intervention due to refusing to participate despite max multimodal cueing. Pt perseverative on this clinician  removing KI, pt educated mutliple times on why this clinician cannot remove KI. Pt then reported, "well then I guess I can't do therapy." RN made aware.      General  Amount of Missed SLP Time (min): 60 Minutes Missed Time Reason: Patient unwilling to participate   James Hamilton 12/29/2013, 1:31 PM

## 2013-12-29 NOTE — Progress Notes (Signed)
Social Work Patient ID: James Hamilton, male   DOB: February 26, 1986, 28 y.o.   MRN: [id number]  Have reviewed team conference information with parents (pt sleeping in bed and does not awaken).  Parents aware we continue to plan for d/c 2/20, however, await follow up from Dr. Zigmund Daniel in morning ([facility]).  Parents concerned about pt's pain and feel this is likely a Gregory crisis.  Mother does plan to be here tomorrow 10-12 for family education esp regarding ortho precautions pt SHOULD be following and correct ways to assist him.  Will continue to follow for support and d/c planning.  Panzy Bubeck, LCSW

## 2013-12-29 NOTE — Progress Notes (Signed)
Nursing Note: Arouses easily and folows simple commands, alert and oriented.T-97.6 P-67 R-18 BP-132/74 PO2 100 % on r/a.No change noted.wbb

## 2013-12-29 NOTE — Progress Notes (Signed)
Physical Therapy Note  Patient Details  Name: Irven Ingalsbe MRN: [id number] Date of Birth: 03/04/1986 Today's Date: 12/29/2013  Patient missed 75 minutes of skilled physical therapy this AM secondary to refusal to participate without medical reason. Various attempts made and max multimodal cues provided, however, patient unwilling to participate, using hands to push therapist away. Will follow up as able.  [place] Tatumn Corbridge, PT, DPT 12/29/2013, 2:09 PM

## 2013-12-29 NOTE — Plan of Care (Signed)
Problem: RH Wheelchair Mobility Goal: LTG Patient will propel w/c in community environment (PT) LTG: Patient will propel wheelchair in community environment, # of feet with assist (PT)  Outcome: Not Applicable Date Met:  15/18/34 goal discharged 12/29/13 due to lack of participation

## 2013-12-29 NOTE — Progress Notes (Signed)
Physical Therapy Weekly Progress Note  Patient Details  Name: James Hamilton MRN: [id number] Date of Birth: 1986/09/29  Today's Date: 12/29/2013  Patient has met 1 of 8 long term goals.  Short term goals not set due to estimated length of stay. Patient was making good progress in initial days on rehab. However, in recent days, patient demonstrating increased resistance with therapy as well as other staff members with regards to all aspects of care. Patient with continued decreased participation as well as non-acceptance of precautions, impulsivity, poor safety awareness, and poor motivation to improve. Patient demonstrating intermittent mild verbal and physical bouts of agitation, i.e., pushing therapists/staff members away when assisting, removing KI and sling immediately after staff member dons, non-adherence to precautions, etc. Patient unable to maintain NWB precautions on R UE/LE, however is able to verbalize his precautions, verbalize the consequences of weight bearing, however, in the moment when patient is reminded of precautions, states "It's easier this way" or "I'm going to do it the way I want." Patient continues to require Max-total A for initiation, sustained attention, intellectual awareness, and safety awareness.  Patient continues to demonstrate the following deficits: poor safety awareness, poor adherence to NWB precautions on R UE/LE, impulsivity, poor activity tolerance, high reported pain levels, poor postural control in sitting, decreased strength, decreased attention, decreased coordination and therefore will continue to benefit from skilled PT intervention to enhance overall performance with activity tolerance, balance, postural control, ability to compensate for deficits, functional use of  left upper extremity and left lower extremity, attention, awareness, coordination and knowledge of precautions.  See Patient's Care Plan for progression toward long term goals.  Patient not  progressing toward long term goals.  See goal revision..  Plan of care revisions: Goals downgraded to minA overall secondary to patient's inability to adhere to NWB precautions for RU/LE. Caregivers will likely have to provide minA in order to assist patient in maintaining NWB precautions.. Gait goals discharged secondary to new NWB status of R UE from greater tuberosity fx as of 12/25/13. Community wheelchair propulsion goal discharged secondary to lack of participation.  Therapy Documentation Precautions:  Precautions Precautions: Fall Precaution Comments: cognitive deficits Required Braces or Orthoses: Knee Immobilizer - Right;Sling Knee Immobilizer - Right: On at all times Restrictions Weight Bearing Restrictions: Yes RUE Weight Bearing: Non weight bearing (does not follow precautions) RLE Weight Bearing: Non weight bearing Other Position/Activity Restrictions: R UE NWB as of 12/25/13  See FIM for current functional status  Therapy/Group: Individual Therapy  Lillia Abed. Braison Snoke, PT, DPT 12/29/2013, 11:17 AM
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
## 2013-12-30 ENCOUNTER — Inpatient Hospital Stay (HOSPITAL_COMMUNITY): Payer: Medicaid Other | Admitting: *Deleted

## 2013-12-30 ENCOUNTER — Inpatient Hospital Stay (HOSPITAL_COMMUNITY): Payer: PRIVATE HEALTH INSURANCE

## 2013-12-30 ENCOUNTER — Encounter (HOSPITAL_COMMUNITY): Payer: PRIVATE HEALTH INSURANCE | Admitting: Occupational Therapy

## 2013-12-30 ENCOUNTER — Inpatient Hospital Stay (HOSPITAL_COMMUNITY): Payer: PRIVATE HEALTH INSURANCE | Admitting: *Deleted

## 2013-12-30 ENCOUNTER — Encounter (HOSPITAL_COMMUNITY): Payer: PRIVATE HEALTH INSURANCE | Admitting: Speech Pathology

## 2013-12-30 DIAGNOSIS — D57419 Sickle-cell thalassemia with crisis, unspecified: Secondary | ICD-10-CM

## 2013-12-30 LAB — BASIC METABOLIC PANEL
BUN: 12 mg/dL (ref 6–23)
CALCIUM: 9.6 mg/dL (ref 8.4–10.5)
CHLORIDE: 92 meq/L — AB (ref 96–112)
CO2: 29 meq/L (ref 19–32)
Creatinine, Ser: 0.87 mg/dL (ref 0.50–1.35)
GFR calc Af Amer: >90 mL/min (ref >90)
GFR calc non Af Amer: >90 mL/min (ref >90)
GLUCOSE: 110 mg/dL — AB (ref 70–99)
POTASSIUM: 4.1 meq/L (ref 3.7–5.3)
Sodium: 130 mEq/L — ABNORMAL LOW (ref 137–147)

## 2013-12-30 MED ORDER — SODIUM CHLORIDE 0.9 % IV SOLN
INTRAVENOUS | Status: DC
  Administered 2013-12-30 – 2014-01-01 (×4): via INTRAVENOUS
  Administered 2014-01-01: 1000 mL via INTRAVENOUS
  Administered 2014-01-02: 08:00:00 via INTRAVENOUS

## 2013-12-30 MED ORDER — KETOROLAC TROMETHAMINE 30 MG/ML IJ SOLN
30.0000 mg | Freq: Four times a day (QID) | INTRAMUSCULAR | Status: AC
  Administered 2013-12-30 – 2014-01-04 (×15): 30 mg via INTRAVENOUS
  Filled 2013-12-30 (×22): qty 1

## 2013-12-30 MED ORDER — DEXTROSE-NACL 5-0.9 % IV SOLN: INTRAVENOUS | Status: DC

## 2013-12-30 MED ORDER — MORPHINE SULFATE 2 MG/ML IJ SOLN
4.0000 mg | INTRAMUSCULAR | Status: DC | PRN
  Administered 2013-12-30: 2 mg via INTRAVENOUS
  Administered 2013-12-30 (×2): 4 mg via INTRAVENOUS
  Administered 2013-12-30 – 2013-12-31 (×3): 6 mg via INTRAVENOUS
  Filled 2013-12-30: qty 3
  Filled 2013-12-30: qty 2
  Filled 2013-12-30 (×2): qty 3
  Filled 2013-12-30 (×2): qty 2

## 2013-12-30 MED ORDER — DULOXETINE HCL 20 MG PO CPEP
20.0000 mg | ORAL_CAPSULE | Freq: Every day | ORAL | Status: DC
  Administered 2013-12-30 – 2014-01-11 (×13): 20 mg via ORAL
  Filled 2013-12-30 (×14): qty 1

## 2013-12-30 NOTE — Progress Notes (Signed)
Subjective/Complaints: Pt had a better night apparently. Pain remains a problem. Complains of increased low back pain particular this am. A 12 point review of systems has been performed and if not noted above is otherwise negative.   Objective: Vital Signs: Blood pressure 142/77, pulse 61, temperature 97.5 F (36.4 C), temperature source Oral, resp. rate 18, weight 66.497 kg (146 lb 9.6 oz), SpO2 100.00%. Dg Lumbar Spine Complete  12/29/2013   CLINICAL DATA:  Pain post trauma  EXAM: LUMBAR SPINE - COMPLETE 4+ VIEW  COMPARISON:  None.  FINDINGS: Frontal, lateral, spot lumbosacral lateral, and bilateral oblique views were obtained. There are 5 non-rib-bearing lumbar type vertebral bodies There is no fracture or spondylolisthesis. Disc spaces appear intact. There is no appreciable facet arthropathy.  IMPRESSION: No fracture or appreciable arthropathy.   Electronically Signed   By: Lowella Grip M.D.   On: 12/29/2013 15:18   Ct Head Wo Contrast  12/28/2013   CLINICAL DATA:  Lethargy. Traumatic brain injury. Follow-up hemorrhagic contusions in midline shift.  EXAM: CT HEAD WITHOUT CONTRAST  TECHNIQUE: Contiguous axial images were obtained from the base of the skull through the vertex without intravenous contrast.  COMPARISON:  12/19/2013  FINDINGS: Again seen are hemorrhagic contusions involving the anterior right frontal lobe, right greater than left inferior frontal lobes, and high right frontal lobe near the vertex. Overall, the areas of hemorrhage demonstrated interval evolution in density of blood products. Confluent edema in the right frontal lobe appears to have increased slightly from the prior study, and there is now 12 mm of leftward midline shift (previously 10 mm). Left inferior frontal lobe edema may have also minimally increased. The right lateral ventricle remains effaced. There is slightly increased dilatation of the atrium and temporal horn of the left lateral ventricle. Basilar  cisterns remain patent. Prior right-sided subdural hematoma is no longer definitely identified.  Right-sided scalp hematoma has decreased. Orbits are unremarkable. Nondisplaced left occipital bone fracture is again noted with mild diastasis of the left lambdoid suture. Mastoid air cells are clear. Left sphenoid sinus mucosal thickening/fluid has improved.  IMPRESSION: 1. Evolving right temporal and right greater than left frontal lobe hemorrhagic contusions. 2. Edema in the right frontal lobe has mildly increased from the prior study, and there is slightly increased leftward midline shift of 12 mm (previously 10 mm). 3. Slightly increased size of the atrium and temporal horn of the left lateral ventricle, which could represent early trapping due to midline shift.   Electronically Signed   By: Logan Bores   On: 12/28/2013 18:37    Recent Labs  12/28/13 1303 12/29/13 0550  WBC 17.8* 14.3*  HGB 10.1* 10.1*  HCT 29.2* 30.2*  PLT 503* 513*    Recent Labs  12/29/13 1005 12/30/13 0635  NA 135* 130*  K 4.1 4.1  CL 92* 92*  GLUCOSE 80 110*  BUN 14 12  CREATININE 0.92 0.87  CALCIUM 9.9 9.6   CBG (last 3)  No results found for this basename: GLUCAP,  in the last 72 hours  Wt Readings from Last 3 Encounters:  12/23/13 66.497 kg (146 lb 9.6 oz)  12/17/13 70.3 kg (154 lb 15.7 oz)  12/17/13 70.3 kg (154 lb 15.7 oz)    Physical Exam:  General: Awake, NAD HEENT: Head is normocephalic, atraumatic, PERRLA, EOMI, sclera anicteric, oral mucosa pink and moist, dentition intact, ext ear canals clear,  Neck: Supple without JVD or lymphadenopathy  Heart: Reg rate and rhythm.  No murmurs rubs or gallops  Chest: CTA bilaterally without wheezes, rales, or rhonchi; no distress  Abdomen: Soft, non-tender, non-distended, bowel sounds positive.  Extremities: No clubbing, cyanosis, or edema. Pulses are 2+  Skin: Clean and intact without signs of breakdown. Right leg incisions all intact without  drainage. Neuro: more alert, still distracted.   Answers basic questions regarding living situation.   Moves all 4's except for right leg. Senses pain in all 4's. 3-/5 R delt, 4/5 R bi, tri, grip, 5/5 in LUE and LLE, 2- R hip flex, 3/5 ankle Musculoskeletal: right leg in KI with ACE wrap over lower portion of leg. Pain with palpation over lower lumbar spine---L4-5 region, and associated paraspinals. Tender in sitting and supine. Psych: Pt's affect is flat, remains distracted. Can be redirected   Assessment/Plan: 1. 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 Physiatrist is providing close team supervision and 24 hour management of active medical problems listed below. Physiatrist and rehab team continue to assess barriers to discharge/monitor patient progress toward functional and medical goals.  FIM: FIM - Bathing Bathing Steps Patient Completed: Chest;Right Arm;Left Arm;Abdomen;Front perineal area;Right upper leg;Left upper leg Bathing: 0: Activity did not occur  FIM - Upper Body Dressing/Undressing Upper body dressing/undressing steps patient completed: Thread/unthread right sleeve of pullover shirt/dresss;Thread/unthread left sleeve of pullover shirt/dress;Put head through opening of pull over shirt/dress;Pull shirt over trunk Upper body dressing/undressing: 5: Set-up assist to: Obtain clothing/put away FIM - Lower Body Dressing/Undressing Lower body dressing/undressing steps patient completed: Thread/unthread left underwear leg;Pull underwear up/down;Thread/unthread left pants leg;Pull pants up/down Lower body dressing/undressing: 2: Max-Patient completed 25-49% of tasks  FIM - Toileting Toileting steps completed by patient: Performs perineal hygiene;Adjust clothing prior to toileting Toileting Assistive Devices: Grab bar or rail for support Toileting: 3:  Mod-Patient completed 2 of 3 steps  FIM - Radio producer Devices: Grab bars Toilet Transfers: 4-To toilet/BSC: Min A (steadying Pt. > 75%);4-From toilet/BSC: Min A (steadying Pt. > 75%)  FIM - Bed/Chair Transfer Bed/Chair Transfer Assistive Devices: Bed rails Bed/Chair Transfer: 4: Supine > Sit: Min A (steadying Pt. > 75%/lift 1 leg);4: Sit > Supine: Min A (steadying pt. > 75%/lift 1 leg);4: Bed > Chair or W/C: Min A (steadying Pt. > 75%);4: Chair or W/C > Bed: Min A (steadying Pt. > 75%)  FIM - Locomotion: Wheelchair Distance: 120 Locomotion: Wheelchair: 0: Activity did not occur FIM - Locomotion: Ambulation Locomotion: Ambulation Assistive Devices: Administrator Ambulation/Gait Assistance: Not tested (comment) Locomotion: Ambulation: 0: Activity did not occur  Comprehension Comprehension Mode: Auditory Comprehension: 3-Understands basic 50 - 74% of the time/requires cueing 25 - 50%  of the time  Expression Expression Mode: Verbal Expression: 4-Expresses basic 75 - 89% of the time/requires cueing 10 - 24% of the time. Needs helper to occlude trach/needs to repeat words.  Social Interaction Social Interaction: 2-Interacts appropriately 25 - 49% of time - Needs frequent redirection.  Problem Solving Problem Solving: 2-Solves basic 25 - 49% of the time - needs direction more than half the time to initiate, plan or complete simple activities  Memory Memory: 2-Recognizes or recalls 25 - 49% of the time/requires cueing 51 - 75% of the time  Medical Problem List and Plan:  TBI with skull fracture, left femural neck fracture with compression screw and left femoral shaft fracture with IM nail--NWB LLE.     1. DVT Prophylaxis/Anticoagulation:  Mechanical: Sequential compression devices, below knee Bilateral lower extremities  2. Pain Management: Has intermittent pain with sickle cell crises. Will continue oxycodone prn.  Added low dose fentanyl patch and  lidoderm patches for low back  -xrays of back negative  -some of pain could be related to his [place]. Have asked Dr. Zigmund Daniel to help with assessment of this   -(IV MSo4 and IVF started last night)  -I asked pt if he felt this was similar to his sickle cell pain, and he didn't feel that it was. Pain has been more specific to his areas of injury including his head, leg, shoulder. Low back pain is only area which isn't consistent.  3. Mood: Internally distracted with poor insight and impaired awareness. Will monitor as mentation improves. LCSW to follow for evaluation and support.  - ritalin---continue at 5mg  -seroquel is helping sleep 4. Neuropsych: This patient is not capable of making decisions on his own behalf.  5. 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 Hypokalemia: 3.4, continue supplementation 7. Occult intra-articular fracture of the lateral tibial plateau with intercondylar extension and hemarthrosis and probable MCL strain: Continue NWB, KI. May open KI  for 1 hour per day, only if supervised  8. Constipation:  bm's yesterday 9. Right shoulder---proximal, greater tuberosity humerus fx's, sling, no ROM  -continue POC, ortho in agreement. 10. Low back pain. xrays negative (see above)  -lidoderm patches 11. Sickle Cell disease: ensure adequate hydration, pain control 12. Lethargy: may be largely sleep related  -CT shows increased shift--appreciate NS follow up  -daily BMET----need to change IVF to D5 NS---sodium down to 130 today   LOS (Days) 9 A FACE TO FACE EVALUATION WAS PERFORMED  Lawanna Cecere T 12/30/2013 8:15 AM

## 2013-12-30 NOTE — Progress Notes (Signed)
Physical Therapy Note  Patient Details  Name: Traye Bates MRN: [id number] Date of Birth: [birth year] Today's Date: 12/30/2013  Patient missed 45 minutes of skilled physical therapy this PM secondary to refusal to participate without medical reason. Patient asked for use of moist heat pad from gym, attempted to encourage patient to get OOB for use of moist heat, however patient refused. RN aware. Will follow up as able.   [place] Jerilynn Feldmeier, PT, DPT 12/30/2013, 2:31 PM

## 2013-12-30 NOTE — Progress Notes (Signed)
Occupational Therapy Session Note  Patient Details  Name: James Hamilton MRN: [id number] Date of Birth: 1986/07/11  Today's Date: 12/30/2013 Time: 1000-1100 Time Calculation (min): 60 min  Short Term Goals: Week 1:  OT Short Term Goal 1 (Week 1): pt will demonstrate sustain attention for 1 minute to adl task OT Short Term Goal 1 - Progress (Week 1): Progressing toward goal OT Short Term Goal 2 (Week 1): Pt will demonstrate bed mobility HOB flat with min (A) OT Short Term Goal 2 - Progress (Week 1): Progressing toward goal (needs cues for NWB RT UE) OT Short Term Goal 3 (Week 1): Pt will complete toilet transfer Min (A)  OT Short Term Goal 3 - Progress (Week 1): Progressing toward goal OT Short Term Goal 4 (Week 1): pt will complete grooming task min verbal cues and min (A) OT Short Term Goal 4 - Progress (Week 1): Progressing toward goal  Skilled Therapeutic Interventions/Progress Updates:   1:1 self care retraining / family education with mother: Pt supine on arrival with decr arousal to name call. Lights turned on, windows open for sunlight and name call for arousal. Room setup in prep for mother arrival. Mother arriving and tactile input to arousal patient. Pt and mother educated on the purpose of session and goals during session ( bathing supine, dressing supine, basic w/c transfer, toilet transfer to 3n1) Pt educated that pt must work with therapy to get moist head and keep machines. Pt states "okay "Mother educated on guarding Rt Ue during bathing / dressing to prevent further injury. Pt with max cueing during session and continues to use Rt UE to bridge buttock off mattress and dressing. Pt requesting mother complete task that patient is able to perform. Mother educated on encouragement for patient engagement and how movements such as lt LE cross can be a passive stretch for his back back pain to help while achieving lB bathing/dressing goals. Pt performing Rt knee flexion during  session so  Foot board removed to keep knee in extension. Pt with KI partially doff on arrival and noted to have bruising on anterior aspect of RT LE. Mother educated this bruising could be due to non compliance with proper wear of brace. Mother don brace during session with OT assistance. Mother and patient don LB clothing with patient breaking precautions of RT UE and rolling onto Rt shoulder. Pt reluctant to brush teeth on edge of bed and required > 15 minutes to progress to sitting with physical (A) to initiate and HOB incr. Pt reports "the medication got me" , "my back" , " i am too sleepy" , "why are yall so annoying", "just give me the tooth brush". Pt provided tooth brush by mother pt looking at tooth brush and states "put some paste on it"  Mother doing all the bargaining requesting of patient "applied paste in front of patient without handing it back to patient" , setting up bedside tray, rubbing Lt LE, promise to go get daughter and massage. Pt non compliant with behavioral strategies of compliance of mother. Pt once eob completing oral care. Mother educated on sling with ace wrap. Pt remained of the main task that were introduced at beginning of session for educations. Pt states "okay" when OT stands at eob to (A) for toilet transfer pt returns to supine refusing to get up. Pt not motivated by daughter anymore at this point in session and states "i just wont see her" "mom just go get her". Pt educated that moist heat  machines in room will be removed due to inability to complete entire session. Pt states "fine" pt able to recall reasons during session for NWB RT UE to mother verbally with details that surgery could be required if non compliant. OT ending session , removing heat and allowing PT Bridget to start session. Education with mother not complete due to patient unwilling to finish task.   Therapy Documentation Precautions:  Precautions Precautions: Fall Precaution Comments: cognitive  deficits Required Braces or Orthoses: Knee Immobilizer - Right;Sling Knee Immobilizer - Right: On at all times Restrictions Weight Bearing Restrictions: Yes RUE Weight Bearing: Non weight bearing RLE Weight Bearing: Non weight bearing Other Position/Activity Restrictions: R UE NWB as of 12/25/13 General: Pt most limited this session by behavior.   Pain: Pain Assessment Pain Assessment: 0-10 Pain Score: 5  IV pain medication pushed     See FIM for current functional status  Therapy/Group: Individual Therapy  Peri Maris 12/30/2013, 11:19 AM Pager: [phone]

## 2013-12-30 NOTE — Progress Notes (Signed)
Physical Therapy Session Note  Patient Details  Name: James Hamilton MRN: [id number] Date of Birth: 10-Oct-1986  Today's Date: 12/30/2013 Time: 1100-1135 Time Calculation (min): 35 min  Short Term Goals: Week 2:  PT Short Term Goal 1 (Week 2): =LTG  Skilled Therapeutic Interventions/Progress Updates:    Patient received semi-reclined in bed, from OT. Patient's mom present for family education/training session. Patient initially refusing to participate and requires max multimodal cues from therapist and mom to participate. Patient finally agreeable to participate in education session with motivators of moist heat, sprite, graham crackers. Discussion throughout session with patient's mom about patient's abilities vs. Patient motivation to perform certain activities. Education about patient's weight bearing precautions, which his mom is already aware of. Education about impulsivity, unsafe behaviors, poor safety awareness, poor emergent and anticipatory awareness. Discussion about proper set up, safety, assistance, sequencing, and technique during functional transfers. Patient performed bed<>wheelchair transfer with therapist for demonstration (requiring minA for R LE management to maintain NWB precautions). Patient and mom return demonstrated bed<>wheelchair transfer with minimal cues from therapist.  Extensive discussion with patient's mom after patient returned to bed about patient's increased resistance with therapy as well as other staff members with regards to all aspects of care. Discussion about patient's continued decreased participation as well as non-acceptance of precautions, impulsivity, poor safety awareness, and poor motivation to improve. Additionally, patient demonstrating intermittent mild verbal and physical bouts of agitation, i.e., pushing therapists/staff members away when assisting, removing KI and sling immediately after staff member dons, non-adherence to precautions, etc.  Explained that patient is unable to maintain NWB precautions on R UE/LE, however is able to verbalize his precautions and verbalize the consequences of weight bearing, however, in the moment when patient is reminded of precautions, states "It's easier this way" or "I'm going to do it the way I want." Discussion about DME recommendations and demonstration with one-arm drive wheelchair and wheelchair parts management. Patient's mom asked appropriate questions throughout session regarding patient's care upon discharge and has verbalized understanding of recommendations. No further questions at this time.  Therapy Documentation Precautions:  Precautions Precautions: Fall Precaution Comments: cognitive deficits Required Braces or Orthoses: Knee Immobilizer - Right;Sling Knee Immobilizer - Right: On at all times Restrictions Weight Bearing Restrictions: Yes RUE Weight Bearing: Non weight bearing RLE Weight Bearing: Non weight bearing Other Position/Activity Restrictions: R UE NWB as of 12/25/13 General: Amount of Missed PT Time (min): 25 Minutes Missed Time Reason: Patient unwilling/refused to participate without medical reason Pain: Pain Assessment Pain Assessment: 0-10 Pain Score: 5  Pain Type: Surgical pain Pain Location: Leg Pain Orientation: Right Pain Descriptors / Indicators: Aching;Sore Pain Frequency: Constant Pain Onset: On-going Patients Stated Pain Goal: 5 Pain Intervention(s): RN made aware;Repositioned;Ambulation/increased activity Multiple Pain Sites: No Locomotion : Ambulation Ambulation/Gait Assistance: Not tested (comment)   See FIM for current functional status  Therapy/Group: Individual Therapy  Lillia Abed. [name], PT, DPT 12/30/2013, 11:40 AM
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
## 2013-12-30 NOTE — Plan of Care (Signed)
Problem: RH Wheelchair Mobility Goal: LTG Patient will propel w/c in controlled environment (PT) LTG: Patient will propel wheelchair in controlled environment, # of feet with assist (PT)  goal downgraded 12/30/13 secondary to lack of participation Goal: LTG Patient will propel w/c in home environment (PT) LTG: Patient will propel wheelchair in home environment, # of feet with assistance (PT).  goal downgraded 12/30/13 secondary to lack of participation

## 2013-12-30 NOTE — Progress Notes (Signed)
SLP Cancellation Note  Patient Details Name: James Hamilton MRN: [id number] DOB: [birth year]   Cancelled treatment:     Pt missed 45 minutes of skilled SLP intervention due to refusal to participate. Session's focus was going to be on family education, however, pt's mother had already left for the day which made it difficult to bargain/encourage pt in order for participation. Pt reported "I just need to sleep." RN made aware.     Caitlyne Ingham 12/30/2013, 1:30 PM

## 2013-12-30 NOTE — Progress Notes (Signed)
Physical Therapy Session Note  Patient Details  Name: James Hamilton MRN: [id number] Date of Birth: 19-Oct-1986  Today's Date: 12/30/2013 Time: 6553-7482 Time Calculation (min): 15 min  Skilled Therapeutic Interventions/Progress Updates:  Pt received supine in bed, therapist providing max encouragement x59min for pt to participate in skilled physical therapy session. Pt missed 48min of tx session secondary to refusal to participate without medical reason. All needs w/in reach, bed alarm on.   Therapy Documentation Precautions:  Precautions Precautions: Fall Precaution Comments: cognitive deficits Required Braces or Orthoses: Knee Immobilizer - Right;Sling Knee Immobilizer - Right: On at all times Restrictions Weight Bearing Restrictions: Yes RUE Weight Bearing: Non weight bearing RLE Weight Bearing: Non weight bearing Other Position/Activity Restrictions: R UE NWB as of 12/25/13 General: Amount of Missed PT Time (min): 15 Minutes Missed Time Reason: Patient unwilling/refused to participate without medical reason  See FIM for current functional status  Therapy/Group: Individual Therapy  Gilmore Laroche 12/30/2013, 3:19 PM

## 2013-12-31 ENCOUNTER — Inpatient Hospital Stay (HOSPITAL_COMMUNITY): Payer: Medicaid Other | Admitting: Speech Pathology

## 2013-12-31 ENCOUNTER — Encounter (HOSPITAL_COMMUNITY): Payer: PRIVATE HEALTH INSURANCE | Admitting: Occupational Therapy

## 2013-12-31 ENCOUNTER — Inpatient Hospital Stay (HOSPITAL_COMMUNITY): Payer: PRIVATE HEALTH INSURANCE | Admitting: Physical Therapy

## 2013-12-31 ENCOUNTER — Inpatient Hospital Stay (HOSPITAL_COMMUNITY): Payer: PRIVATE HEALTH INSURANCE

## 2013-12-31 ENCOUNTER — Inpatient Hospital Stay (HOSPITAL_COMMUNITY): Payer: Medicaid Other | Admitting: *Deleted

## 2013-12-31 LAB — BASIC METABOLIC PANEL
BUN: 16 mg/dL (ref 6–23)
CHLORIDE: 100 meq/L (ref 96–112)
CO2: 30 mEq/L (ref 19–32)
Calcium: 8.9 mg/dL (ref 8.4–10.5)
Creatinine, Ser: 1.04 mg/dL (ref 0.50–1.35)
GFR calc Af Amer: >90 mL/min (ref >90)
GFR calc non Af Amer: >90 mL/min (ref >90)
GLUCOSE: 98 mg/dL (ref 70–99)
Potassium: 4.2 mEq/L (ref 3.7–5.3)
Sodium: 137 mEq/L (ref 137–147)

## 2013-12-31 LAB — CBC
HEMATOCRIT: 27.3 % — AB (ref 39.0–52.0)
HEMOGLOBIN: 9.2 g/dL — AB (ref 13.0–17.0)
MCH: 25.5 pg — AB (ref 26.0–34.0)
MCHC: 33.7 g/dL (ref 30.0–36.0)
MCV: 75.6 fL — ABNORMAL LOW (ref 78.0–100.0)
Platelets: 469 10*3/uL — ABNORMAL HIGH (ref 150–400)
RBC: 3.61 MIL/uL — ABNORMAL LOW (ref 4.22–5.81)
RDW: 16.6 % — ABNORMAL HIGH (ref 11.5–15.5)
WBC: 8.5 10*3/uL (ref 4.0–10.5)

## 2013-12-31 MED ORDER — OXYCODONE HCL 5 MG PO TABS
10.0000 mg | ORAL_TABLET | ORAL | Status: DC
  Administered 2013-12-31 – 2014-01-02 (×12): 10 mg via ORAL
  Filled 2013-12-31 (×13): qty 2

## 2013-12-31 MED ORDER — MORPHINE SULFATE 2 MG/ML IJ SOLN
4.0000 mg | INTRAMUSCULAR | Status: DC | PRN
  Administered 2013-12-31: 2 mg via INTRAVENOUS
  Administered 2013-12-31 (×3): 4 mg via INTRAVENOUS
  Administered 2014-01-01 (×2): 6 mg via INTRAVENOUS
  Filled 2013-12-31 (×2): qty 3
  Filled 2013-12-31 (×2): qty 2
  Filled 2013-12-31: qty 3

## 2013-12-31 MED ORDER — FOLIC ACID 1 MG PO TABS
1.0000 mg | ORAL_TABLET | Freq: Every day | ORAL | Status: DC
  Administered 2013-12-31 – 2014-01-11 (×12): 1 mg via ORAL
  Filled 2013-12-31 (×13): qty 1

## 2013-12-31 NOTE — Progress Notes (Signed)
Physical Therapy Note  Patient Details  Name: James Hamilton MRN: [id number] Date of Birth: [birth year] Today's Date: 12/31/2013 1405-1425, 20 min individual therapy; missed 10 min due to pain in LB and R thigh.  RN aware.  Pt unwilling to get OOB but eventually agreed to bedside tx.  Therapeutic exercise performed with bil LEs to increase strength for functional mobility, wearing RLe knee immobilizer:  Active assistive-  R straight leg raise,  1/2 bridging using LLE without use of UEs in supine, R hip extension, R hip abduction in L sidelying, x10 each   Active- bil ankle DF, PF in L sidelying, L shoulder protraction in supine,  x 10 each   Robbert Langlinais 12/31/2013, 2:45 PM

## 2013-12-31 NOTE — Progress Notes (Signed)
Occupational Therapy Session Note  Patient Details  Name: James Hamilton MRN: [id number] Date of Birth: 12-27-1985  Today's Date: 12/31/2013 Time: 0900-0950 Time Calculation (min): 50 min  Short Term Goals: Week 1:  OT Short Term Goal 1 (Week 1): pt will demonstrate sustain attention for 1 minute to adl task OT Short Term Goal 1 - Progress (Week 1): Progressing toward goal OT Short Term Goal 2 (Week 1): Pt will demonstrate bed mobility HOB flat with min (A) OT Short Term Goal 2 - Progress (Week 1): Progressing toward goal (needs cues for NWB RT UE) OT Short Term Goal 3 (Week 1): Pt will complete toilet transfer Min (A)  OT Short Term Goal 3 - Progress (Week 1): Progressing toward goal OT Short Term Goal 4 (Week 1): pt will complete grooming task min verbal cues and min (A) OT Short Term Goal 4 - Progress (Week 1): Progressing toward goal Week 2:  OT Short Term Goal 1 (Week 2): WEEK 1 GOALS REMAIN APPROPRIATE  Skilled Therapeutic Interventions/Progress Updates:    1:1 self care retraining/ cognitive recovery: pt supine awake on arrival. Pt pleasant on therapist arrival and asking immediately for pain medication. Ot asking questioning cue to patient. "what can we do to help you get pain medication?" Pt reports "I told the nurse when she came in and she said i have to wait 30 - 45 minutes" OT cues patient to begin bath setup at bed side on left bedside tray. Pt perseverating on pain medication. Pt states "i am going to do this so you can get me pain medication." pt needed max cueing and tactile input to prevent shoulder abduction and bridging in the bed with RT UE. Pt crossing Lt LE and washing LT LE. Pt refusing EOB for oral care.Pt incr hob to brush teeth. Pt agreeable to EOB to don shoulder sling and ace wrap. PT visually to look at bed surface with linen partially doff and food in bed. Pt agreeable to transfer to chair. Pt transferred min (A). Pt sitting in chair and agreeable to staying in  chair with moist head applied. RN notified pt in chair with quick release belt present. Pt without signs of pain at this time but verbalizes pain.   Therapy Documentation Precautions:  Precautions Precautions: Fall Precaution Comments: cognitive deficits Required Braces or Orthoses: Knee Immobilizer - Right;Sling Knee Immobilizer - Right: On at all times Restrictions Weight Bearing Restrictions: Yes RUE Weight Bearing: Non weight bearing RLE Weight Bearing: Non weight bearing Other Position/Activity Restrictions: R UE NWB as of 12/25/13 General: General Amount of Missed OT Time (min): 10 Minutes Vital Signs:   Pain: Pain Assessment Pain Assessment: 0-10 Pain Score: Asleep Pain Type: Surgical pain Pain Location: Leg Pain Orientation: Right Pain Descriptors / Indicators: Aching Pain Frequency: Constant Pain Onset: On-going Patients Stated Pain Goal: 5 Pain Intervention(s): Medication (See eMAR);Repositioned;Emotional support Multiple Pain Sites: No ADL:   Exercises:   Other Treatments:    See FIM for current functional status  Therapy/Group: Individual Therapy  Peri Maris 12/31/2013, 1:57 PM Pager: [phone]

## 2013-12-31 NOTE — Progress Notes (Signed)
Physical Therapy Session Note  Patient Details  Name: James Hamilton MRN: [id number] Date of Birth: Aug 03, 1986  Today's Date: 12/31/2013 Time: 0100-7121 Time Calculation (min): 25 min  Short Term Goals: Week 2:  PT Short Term Goal 1 (Week 2): =LTG  Skilled Therapeutic Interventions/Progress Updates:    Patient received sitting in recliner. Session focused on activity tolerance with functional transfers and wheelchair mobility. Patient tolerates anterior weight shift in recliner with tactile cues to initiate. MinA transfer recliner>wheelchair>bed via squat pivot with PT holding R hand of patient and lifting R LE to maintain NWB precautions. Wheelchair propulsion x100' with L one arm drive wheelchair and supervision. Sit>supine with supervision and HOB elevated. Patient left semi-reclined in bed with bed alarm on, 3 rails up, and all needs within reach.  Therapy Documentation Precautions:  Precautions Precautions: Fall Precaution Comments: cognitive deficits Required Braces or Orthoses: Knee Immobilizer - Right;Sling Knee Immobilizer - Right: On at all times Restrictions Weight Bearing Restrictions: Yes RUE Weight Bearing: Non weight bearing RLE Weight Bearing: Non weight bearing Other Position/Activity Restrictions: R UE NWB as of 12/25/13 Pain: Pain Assessment Pain Assessment: 0-10 Pain Score: 7  Pain Type: Surgical pain Pain Location: Leg Pain Orientation: Right Pain Descriptors / Indicators: Aching;Sore Pain Frequency: Constant Pain Onset: On-going Patients Stated Pain Goal: 3 Pain Intervention(s): RN made aware;Repositioned;Ambulation/increased activity Multiple Pain Sites: No Locomotion : Ambulation Ambulation/Gait Assistance: Not tested (comment) Wheelchair Mobility Distance: 100   See FIM for current functional status  Therapy/Group: Individual Therapy  Lillia Abed. [name], PT, DPT 12/31/2013, 3:58 PM
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
## 2013-12-31 NOTE — Progress Notes (Addendum)
Physical Therapy Session Note  Patient Details  Name: James Hamilton MRN: [id number] Date of Birth: [birth year]  Today's Date: 12/31/2013 Time: 1430-1500 Time Calculation (min): 30 min  Short Term Goals: Week 1:  PT Short Term Goal 1 (Week 1): STGs=LTGs secondary to LOS  Skilled Therapeutic Interventions/Progress Updates:    Requesting morphine immediately upon entry - RN arrived shortly after to provide medication. Tried to get pt OOB to receive medication however he refused. Had long discussion about pt's daughter and being able to mobilize to eventually get home to her - eventually be able to play with her. Pt still resistant to getting OOB but less argumentative. Finally agreed to get OOB to apply heat to Rt LE. Repeatedly requested to remove KI despite education on risks of injuring joint. Squat pivot transfer to Lt performed with min assist (steady assist). Warm heat applied as pt requested.    Therapy Documentation Precautions:  Precautions Precautions: Fall Precaution Comments: cognitive deficits Required Braces or Orthoses: Knee Immobilizer - Right;Sling Knee Immobilizer - Right: On at all times Restrictions Weight Bearing Restrictions: Yes RUE Weight Bearing: Non weight bearing RLE Weight Bearing: Non weight bearing Other Position/Activity Restrictions: R UE NWB as of 12/25/13  See FIM for current functional status  Therapy/Group: Individual Therapy  Lahoma Rocker 12/31/2013, 4:25 PM

## 2013-12-31 NOTE — Progress Notes (Signed)
Social Work Patient ID: James Hamilton, male   DOB: 29-Jun-1986, 28 y.o.   MRN: [id number]  Contacted today by pt's mother who reports that she has talked extensively with pt's father and other family members in an effort to coordinate 24/7 care for pt but has been unable to do so.  Discussed that pt's goals are minimal assistance and that he must have 24/7 care for d/c.  Mother feels they must change the d/c plan to SNF "for right now to give him more time to reach independence."  Have explained that I can begin SNF bed search, however, they should not anticipate placement locally as he will be Medicaid pending.  Mother is aware that he could be several hours away from home.   Parents with plans to "have a long talk" with pt this evening about this change in plan.  I have informed team members and they will keep on a qd therapy schedule for now, however, plan to d/c if his poor participation continues.   I plan to follow up with mother in the morning to confirm pt is aware AND agreeable with SNF plan.    Kendi Defalco, LCSW
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
## 2013-12-31 NOTE — Progress Notes (Signed)
Speech Language Pathology Daily Session Notes  Patient Details  Name: James Hamilton MRN: [id number] Date of Birth: [birth year]  Today's Date: 12/31/2013  Session 1 Time: 1040-1130 Time Calculation (min): 50 min  Session 2 Time: 0539-7673 Time Calculation: 30 min  Short Term Goals: Week 2: SLP Short Term Goal 1 (Week 2): Pt will consume current diet with minimal overt s/s of aspiration with Mod verbal cues for utilization of swallowing compensatory strategies.  SLP Short Term Goal 2 (Week 2): Pt will identify 2 cognitive impairments with Mod question cues.  SLP Short Term Goal 3 (Week 2): Pt will recall new, daily information with Mod A multimodal cueing  SLP Short Term Goal 4 (Week 2): Pt will demonstrate sustained attention to a functional task for 5 minutes with Max A verbal cues for redirection  SLP Short Term Goal 5 (Week 2): Pt will utilize call bell to request assistance with supervision question cues.   Skilled Therapeutic Interventions:  Session 1: Treatment focus on cognitive goals. Upon arrival, pt supine in bed. Pt declined getting out of bed but did agree to participate in treatment at bedside. Pt independently requested juice and consumed without overt s/s of aspiration and was overall less impulsive with PO intake. After an ~15 minute delay, pt asked clinician if she ever brought the juice and required Max verbal cues for recall. Pt recalled events from morning therapy sessions with Mod question cues. Pt demonstrated increased sustained attention to functional conversation and was overall more cooperative and socially appropriate. Continue with current plan of care.    Session 2: Treatment focus on cognitive goals. Upon arrival, pt sitting up in recliner and was agreeable to leave the room. Pt independently requested a snack and consumed thin liquids via straw and ice cream without overt s/s of aspiration and was Mod I for utilization of swallowing compensatory strategies. Pt  demonstrated selective attention in a mildly distracting environment to a functional conversation for ~5 minutes. Pt also did not mention pain throughout the session. Pt demonstrated functional problem solving for self-care tasks with Min A question cues. Pt was handed off to PT. Continue with current plan of care.   FIM:  Comprehension Comprehension Mode: Auditory Comprehension: 5-Understands basic 90% of the time/requires cueing < 10% of the time Expression Expression Mode: Verbal Expression: 5-Expresses basic 90% of the time/requires cueing < 10% of the time. Social Interaction Social Interaction: 4-Interacts appropriately 75 - 89% of the time - Needs redirection for appropriate language or to initiate interaction. Problem Solving Problem Solving: 4-Solves basic 75 - 89% of the time/requires cueing 10 - 24% of the time Memory Memory: 2-Recognizes or recalls 25 - 49% of the time/requires cueing 51 - 75% of the time  Pain Pain Assessment Pain Assessment: 0-10 Pain Score: 7  Pain Type: Surgical pain Pain Location: Leg Pain Orientation: Right Pain Descriptors / Indicators: Aching;Sore Pain Frequency: Constant Pain Onset: On-going Patients Stated Pain Goal: 3 Pain Intervention(s): RN made aware;Repositioned;Ambulation/increased activity Multiple Pain Sites: No  Therapy/Group: Individual Therapy  Odaliz Mcqueary, [place] 12/31/2013, 4:41 PM

## 2014-01-01 ENCOUNTER — Encounter (HOSPITAL_COMMUNITY): Payer: PRIVATE HEALTH INSURANCE | Admitting: Occupational Therapy

## 2014-01-01 ENCOUNTER — Inpatient Hospital Stay (HOSPITAL_COMMUNITY): Payer: Medicaid Other | Admitting: Physical Therapy

## 2014-01-01 ENCOUNTER — Inpatient Hospital Stay (HOSPITAL_COMMUNITY): Payer: Medicaid Other | Admitting: Speech Pathology

## 2014-01-01 DIAGNOSIS — S06309A Unspecified focal traumatic brain injury with loss of consciousness of unspecified duration, initial encounter: Secondary | ICD-10-CM

## 2014-01-01 DIAGNOSIS — S72309A Unspecified fracture of shaft of unspecified femur, initial encounter for closed fracture: Secondary | ICD-10-CM

## 2014-01-01 DIAGNOSIS — S72009A Fracture of unspecified part of neck of unspecified femur, initial encounter for closed fracture: Secondary | ICD-10-CM

## 2014-01-01 DIAGNOSIS — S0630AA Unspecified focal traumatic brain injury with loss of consciousness status unknown, initial encounter: Secondary | ICD-10-CM

## 2014-01-01 DIAGNOSIS — S020XXA Fracture of vault of skull, initial encounter for closed fracture: Secondary | ICD-10-CM

## 2014-01-01 DIAGNOSIS — IMO0002 Reserved for concepts with insufficient information to code with codable children: Secondary | ICD-10-CM

## 2014-01-01 LAB — BASIC METABOLIC PANEL
BUN: 8 mg/dL (ref 6–23)
CALCIUM: 8.9 mg/dL (ref 8.4–10.5)
CO2: 26 meq/L (ref 19–32)
CREATININE: 0.94 mg/dL (ref 0.50–1.35)
Chloride: 99 mEq/L (ref 96–112)
GFR calc Af Amer: >90 mL/min (ref >90)
GFR calc non Af Amer: >90 mL/min (ref >90)
Glucose, Bld: 120 mg/dL — ABNORMAL HIGH (ref 70–99)
Potassium: 4 mEq/L (ref 3.7–5.3)
Sodium: 137 mEq/L (ref 137–147)

## 2014-01-01 MED ORDER — MORPHINE SULFATE 2 MG/ML IJ SOLN
4.0000 mg | Freq: Four times a day (QID) | INTRAMUSCULAR | Status: DC | PRN
  Administered 2014-01-02: 4 mg via INTRAVENOUS
  Filled 2014-01-01: qty 2

## 2014-01-01 MED ORDER — FENTANYL 12 MCG/HR TD PT72
12.5000 ug | MEDICATED_PATCH | TRANSDERMAL | Status: DC
  Administered 2014-01-01 – 2014-01-04 (×2): 12.5 ug via TRANSDERMAL
  Filled 2014-01-01 (×2): qty 1

## 2014-01-01 NOTE — Progress Notes (Signed)
Speech Language Pathology Daily Session Note  Patient Details  Name: James Hamilton MRN: [id number] Date of Birth: Dec 04, 1985  Today's Date: 01/01/2014 Time: 0900-0915 Time Calculation (min): 15 min  Short Term Goals: Week 2: SLP Short Term Goal 1 (Week 2): Pt will consume current diet with minimal overt s/s of aspiration with Mod verbal cues for utilization of swallowing compensatory strategies.  SLP Short Term Goal 2 (Week 2): Pt will identify 2 cognitive impairments with Mod question cues.  SLP Short Term Goal 3 (Week 2): Pt will recall new, daily information with Mod A multimodal cueing  SLP Short Term Goal 4 (Week 2): Pt will demonstrate sustained attention to a functional task for 5 minutes with Max A verbal cues for redirection  SLP Short Term Goal 5 (Week 2): Pt will utilize call bell to request assistance with supervision question cues.   Skilled Therapeutic Interventions: Treatment focus on cognitive goals. Upon arrival, RN administering medications and pt missed initial 15 minutes of session. SLP facilitated session by providing Mod A question cues for recall of new information. Pt independently utilized his schedule to anticipate therapy sessions and verbalized understanding for need to sit-up in recliner after next therapy session to improve endurance. Per RN, pt followed through with request to stay up in chair for 38 minutes after OT session. Continue with current plan of care.     FIM:  Comprehension Comprehension Mode: Auditory Comprehension: 5-Understands complex 90% of the time/Cues < 10% of the time Expression Expression Mode: Verbal Expression: 5-Expresses complex 90% of the time/cues < 10% of the time Social Interaction Social Interaction: 4-Interacts appropriately 75 - 89% of the time - Needs redirection for appropriate language or to initiate interaction. Problem Solving Problem Solving: 4-Solves basic 75 - 89% of the time/requires cueing 10 - 24% of the  time Memory Memory: 2-Recognizes or recalls 25 - 49% of the time/requires cueing 51 - 75% of the time  Pain Pain all over, pt premedicated, repositioned and heat applied.   Therapy/Group: Individual Therapy  James Hamilton 01/01/2014, 1:17 PM

## 2014-01-01 NOTE — Progress Notes (Signed)
Physical Therapy Note  Patient Details  Name: James Hamilton MRN: [id number] Date of Birth: Jan 06, 1986 Today's Date: 01/01/2014  Time: 1300-1345 45 minutes  1:1 Pt c/o R LE soreness, hot pack applied after session.  Transfers supine to sit with min A, cues for NWB on R UE.  Bed <> w/c transfers and w/c <> mat transfers with min A, cues for NWB on R UE.  Pt able to propel 1 arm drive w/c 325', 498' with supervision, cues for safety.  UE therex with L UE 3 x 15 with 5# wt.  Pt with flat affect, encouragement needed for participation.  Max cues to maintain precautions.   Cherylene Ferrufino 01/01/2014, 1:47 PM

## 2014-01-01 NOTE — Progress Notes (Signed)
Social Work Patient ID: James Hamilton, male   DOB: 1985/11/28, 28 y.o.   MRN: [id number]  Have confirmed with pt and mother that they are all aware and agreed on plan for SNF.  Have left message with financial counseling to please assist with SSD and MA applications ASAP.  (Had sent communication earlier in the week as well.)  This will be a difficult placement, therefore, will enlist assist from Dr. Reynaldo Minium and Social Work Mudlogger as soon as [employer] sent out.  Continue to follow.  Aboubacar Matsuo, LCSW

## 2014-01-01 NOTE — Progress Notes (Signed)
Patient received only scheduled Oxy IR 10mg  and Toradol 30mg  IV injection for pain. Did not request Morphine from this RN on the am shift. Tolerated scheduled medications well. Will continue to monitor.

## 2014-01-01 NOTE — Progress Notes (Signed)
Subjective/Complaints: Pain improving. Slow to awaken this am.   A 12 point review of systems has been performed and if not noted above is otherwise negative.   Objective: Vital Signs: Blood pressure 140/70, pulse 83, temperature 98.7 F (37.1 C), temperature source Oral, resp. rate 18, weight 64.139 kg (141 lb 6.4 oz), SpO2 99.00%. No results found.  Recent Labs  12/31/13 1030  WBC 8.5  HGB 9.2*  HCT 27.3*  PLT 469*    Recent Labs  12/31/13 0602 01/01/14 0450  NA 137 137  K 4.2 4.0  CL 100 99  GLUCOSE 98 120*  BUN 16 8  CREATININE 1.04 0.94  CALCIUM 8.9 8.9   CBG (last 3)  No results found for this basename: GLUCAP,  in the last 72 hours  Wt Readings from Last 3 Encounters:  12/30/13 64.139 kg (141 lb 6.4 oz)  12/17/13 70.3 kg (154 lb 15.7 oz)  12/17/13 70.3 kg (154 lb 15.7 oz)    Physical Exam:  General: Awake, NAD HEENT: Head is normocephalic, atraumatic, PERRLA, EOMI, sclera anicteric, oral mucosa pink and moist, dentition intact, ext ear canals clear,  Neck: Supple without JVD or lymphadenopathy  Heart: Reg rate and rhythm. No murmurs rubs or gallops  Chest: CTA bilaterally without wheezes, rales, or rhonchi; no distress  Abdomen: Soft, non-tender, non-distended, bowel sounds positive.  Extremities: No clubbing, cyanosis, or edema. Pulses are 2+  Skin: Clean and intact without signs of breakdown. Right leg incisions all intact without drainage. Neuro: more alert, still distracted.   Answers basic questions regarding living situation.   Moves all 4's except for right leg. Senses pain in all 4's. 3-/5 R delt, 4/5 R bi, tri, grip, 5/5 in LUE and LLE, 2- R hip flex, 3/5 ankle Musculoskeletal: right leg in KI with ACE wrap over lower portion of leg. Pain with palpation over lower lumbar spine---L4-5 region, and associated paraspinals. Tender in sitting and supine. Psych: Pt's affect is flat, remains distracted. Can be redirected   Assessment/Plan: 1.  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 Physiatrist is providing close team supervision and 24 hour management of active medical problems listed below. Physiatrist and rehab team continue to assess barriers to discharge/monitor patient progress toward functional and medical goals.  Family has backed out and now is pursuing snf  FIM: FIM - Bathing Bathing Steps Patient Completed: Left Arm;Chest;Right Arm;Abdomen;Front perineal area;Left upper leg;Left lower leg (including foot) Bathing: 4: Min-Patient completes 8-9 52f 10 parts or 75+ percent  FIM - Upper Body Dressing/Undressing Upper body dressing/undressing steps patient completed: Thread/unthread right sleeve of pullover shirt/dresss;Thread/unthread left sleeve of pullover shirt/dress;Put head through opening of pull over shirt/dress;Pull shirt over trunk Upper body dressing/undressing: 4: Min-Patient completed 75 plus % of tasks FIM - Lower Body Dressing/Undressing Lower body dressing/undressing steps patient completed: Thread/unthread right pants leg;Thread/unthread left pants leg;Pull pants up/down Lower body dressing/undressing: 4: Min-Patient completed 75 plus % of tasks  FIM - Toileting Toileting steps completed by patient: Performs perineal hygiene;Adjust clothing prior to toileting Toileting Assistive Devices: Grab bar or rail for support Toileting: 3: Mod-Patient completed 2 of 3 steps  FIM - Radio producer Devices: Grab bars Toilet Transfers: 4-To toilet/BSC: Min A (steadying Pt. > 75%);4-From toilet/BSC: Min A (steadying Pt. > 75%)  FIM - Bed/Chair Transfer Bed/Chair Transfer Assistive Devices: Arm rests;Bed rails;HOB elevated Bed/Chair  Transfer: 4: Bed > Chair or W/C: Min A (steadying Pt. > 75%)  FIM - Locomotion: Wheelchair Distance: 100 Locomotion:  Wheelchair: 2: Travels 16 - 149 ft with supervision, cueing or coaxing FIM - Locomotion: Ambulation Locomotion: Ambulation Assistive Devices: Administrator Ambulation/Gait Assistance: Not tested (comment) Locomotion: Ambulation: 0: Activity did not occur  Comprehension Comprehension Mode: Auditory Comprehension: 5-Understands complex 90% of the time/Cues < 10% of the time  Expression Expression Mode: Verbal Expression: 5-Expresses complex 90% of the time/cues < 10% of the time  Social Interaction Social Interaction: 4-Interacts appropriately 75 - 89% of the time - Needs redirection for appropriate language or to initiate interaction.  Problem Solving Problem Solving: 4-Solves basic 75 - 89% of the time/requires cueing 10 - 24% of the time  Memory Memory: 2-Recognizes or recalls 25 - 49% of the time/requires cueing 51 - 75% of the time  Medical Problem List and Plan:  TBI with skull fracture, left femural neck fracture with compression screw and left femoral shaft fracture with IM nail--NWB LLE.     1. DVT Prophylaxis/Anticoagulation: Mechanical: Sequential compression devices, below knee Bilateral lower extremities  2. Pain Management: Trauma/sickle cell crisis  -pain better, wbc's coming down  Will continue oxycodone. Increase fentanyl patch to 44mcg.   -wean IV morphine  -xrays of back negative  -decrease   -Pain predominantly in RLE (head and  Shoulder also). Low back better.  3. Mood: i'm fine with cymbalta -seroquel is helping sleep 4. Neuropsych: This patient is not capable of making decisions on his own behalf.  5. 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 Hypokalemia: 3.4, continue supplementation 7. Occult intra-articular fracture of the lateral tibial plateau with intercondylar extension and hemarthrosis and probable MCL strain: Continue NWB, KI. May open KI  for 1 hour per day, only if supervised  8. Constipation:  bm's yesterday 9. Right  shoulder---proximal, greater tuberosity humerus fx's, sling, no ROM  -continue POC, ortho in agreement. 10. Low back pain. xrays negative (see above)  -lidoderm patches 11. Sickle Cell disease: ensure adequate hydration, pain control 12. Lethargy: neuro and behavioral  -CT shows increased shift--appreciate NS follow up  -neurologically he's stable  -sodium improved     LOS (Days) 11 A FACE TO FACE EVALUATION WAS PERFORMED  Vernon Ariel T 01/01/2014 8:25 AM

## 2014-01-01 NOTE — Progress Notes (Signed)
Occupational Therapy Session Note  Patient Details  Name: James Hamilton MRN: [id number] Date of Birth: Oct 16, 1986  Today's Date: 01/01/2014 Time: 1000-1045 Time Calculation (min): 45 min  Short Term Goals: Week 1:  OT Short Term Goal 1 (Week 1): pt will demonstrate sustain attention for 1 minute to adl task OT Short Term Goal 1 - Progress (Week 1): Progressing toward goal OT Short Term Goal 2 (Week 1): Pt will demonstrate bed mobility HOB flat with min (A) OT Short Term Goal 2 - Progress (Week 1): Progressing toward goal (needs cues for NWB RT UE) OT Short Term Goal 3 (Week 1): Pt will complete toilet transfer Min (A)  OT Short Term Goal 3 - Progress (Week 1): Progressing toward goal OT Short Term Goal 4 (Week 1): pt will complete grooming task min verbal cues and min (A) OT Short Term Goal 4 - Progress (Week 1): Progressing toward goal  Week 2:  OT Short Term Goal 1 (Week 2): WEEK 1 GOALS REMAIN APPROPRIATE  Skilled Therapeutic Interventions/Progress Updates:  Upon entering room, patient found supine in bed with complaints of pain and asking for morphine. Patient with decreased initiation to ask nurse for pain medication, with min verbal cues patient called > nurses station. From here, patient completed UB/LB bathing & dressing from bed level. Patient with poor/decreased initiation throughout session and needed constant cueing to stay on task and finish task at hand. During UB dressing, IV line came loose. Therapist immediately notified RN. After dressing, patient transferred > recliner with minimal assistance. Per SLP report, patient stated he would stay up for "38 minutes" after ADL session with OT. Patient remembered this and was more than willing to sit in recliner after session. Patient requested "heat from gym", therapist gathered heat pack from gym, wrapped it in towel, and donned > patients right thigh. Patient left seated in recliner with all needed items within reach and NT  present in room.   Precautions:  Precautions Precautions: Fall Precaution Comments: cognitive deficits Required Braces or Orthoses: Knee Immobilizer - Right;Sling Knee Immobilizer - Right: On at all times Restrictions Weight Bearing Restrictions: Yes RUE Weight Bearing: Non weight bearing RLE Weight Bearing: Non weight bearing Other Position/Activity Restrictions: R UE NWB as of 12/25/13  See FIM for current functional status  Therapy/Group: Individual Therapy  James Hamilton 01/01/2014, 11:03 AM

## 2014-01-02 ENCOUNTER — Inpatient Hospital Stay (HOSPITAL_COMMUNITY): Payer: Medicaid Other | Admitting: Physical Therapy

## 2014-01-02 ENCOUNTER — Inpatient Hospital Stay (HOSPITAL_COMMUNITY): Payer: Medicaid Other | Admitting: *Deleted

## 2014-01-02 LAB — BASIC METABOLIC PANEL
BUN: 8 mg/dL (ref 6–23)
CO2: 30 mEq/L (ref 19–32)
CREATININE: 1.05 mg/dL (ref 0.50–1.35)
Calcium: 8.6 mg/dL (ref 8.4–10.5)
Chloride: 102 mEq/L (ref 96–112)
GFR calc Af Amer: >90 mL/min (ref >90)
GLUCOSE: 92 mg/dL (ref 70–99)
Potassium: 3.8 mEq/L (ref 3.7–5.3)
SODIUM: 141 meq/L (ref 137–147)

## 2014-01-02 MED ORDER — MORPHINE SULFATE 2 MG/ML IJ SOLN
4.0000 mg | Freq: Two times a day (BID) | INTRAMUSCULAR | Status: DC | PRN
  Administered 2014-01-02 – 2014-01-04 (×2): 4 mg via INTRAVENOUS
  Filled 2014-01-02 (×2): qty 2

## 2014-01-02 MED ORDER — OXYCODONE HCL 5 MG PO TABS
10.0000 mg | ORAL_TABLET | Freq: Four times a day (QID) | ORAL | Status: DC
  Administered 2014-01-02 – 2014-01-06 (×16): 10 mg via ORAL
  Filled 2014-01-02 (×16): qty 2

## 2014-01-02 NOTE — Progress Notes (Signed)
Subjective/Complaints: Pain improving. Not requesting IV MSo4.   A 12 point review of systems has been performed and if not noted above is otherwise negative.   Objective: Vital Signs: Blood pressure 122/70, pulse 81, temperature 98 F (36.7 C), temperature source Oral, resp. rate 17, weight 64.139 kg (141 lb 6.4 oz), SpO2 97.00%. No results found.  Recent Labs  12/31/13 1030  WBC 8.5  HGB 9.2*  HCT 27.3*  PLT 469*    Recent Labs  01/01/14 0450 01/02/14 0537  NA 137 141  K 4.0 3.8  CL 99 102  GLUCOSE 120* 92  BUN 8 8  CREATININE 0.94 1.05  CALCIUM 8.9 8.6   CBG (last 3)  No results found for this basename: GLUCAP,  in the last 72 hours  Wt Readings from Last 3 Encounters:  12/30/13 64.139 kg (141 lb 6.4 oz)  12/17/13 70.3 kg (154 lb 15.7 oz)  12/17/13 70.3 kg (154 lb 15.7 oz)    Physical Exam:  General: Awake, NAD HEENT: Head is normocephalic, atraumatic, PERRLA, EOMI, sclera anicteric, oral mucosa pink and moist, dentition intact, ext ear canals clear,  Neck: Supple without JVD or lymphadenopathy  Heart: Reg rate and rhythm. No murmurs rubs or gallops  Chest: CTA bilaterally without wheezes, rales, or rhonchi; no distress  Abdomen: Soft, non-tender, non-distended, bowel sounds positive.  Extremities: No clubbing, cyanosis, or edema. Pulses are 2+  Skin: Clean and intact without signs of breakdown. Right leg incisions all intact without drainage. Neuro: more alert, still distracted.   Answers basic questions regarding living situation.   Moves all 4's except for right leg. Senses pain in all 4's. 3-/5 R delt, 4/5 R bi, tri, grip, 5/5 in LUE and LLE, 2- R hip flex, 3/5 ankle Musculoskeletal: right leg in KI with ACE wrap over lower portion of leg. Pain with palpation over lower lumbar spine---L4-5 region, and associated paraspinals. Tender in sitting and supine. Psych: Pt's affect is flat, remains distracted. Can be redirected   Assessment/Plan: 1.  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 Physiatrist is providing close team supervision and 24 hour management of active medical problems listed below. Physiatrist and rehab team continue to assess barriers to discharge/monitor patient progress toward functional and medical goals.  Family has backed out and now is pursuing snf  FIM: FIM - Bathing Bathing Steps Patient Completed: Left Arm;Chest;Right Arm;Abdomen;Front perineal area;Left upper leg;Buttocks;Right upper leg;Left lower leg (including foot) Bathing: 4: Min-Patient completes 8-9 48f 10 parts or 75+ percent  FIM - Upper Body Dressing/Undressing Upper body dressing/undressing steps patient completed: Thread/unthread right sleeve of pullover shirt/dresss;Thread/unthread left sleeve of pullover shirt/dress;Put head through opening of pull over shirt/dress Upper body dressing/undressing: 4: Min-Patient completed 75 plus % of tasks FIM - Lower Body Dressing/Undressing Lower body dressing/undressing steps patient completed: Pull pants up/down Lower body dressing/undressing: 1: Total-Patient completed less than 25% of tasks  FIM - Toileting Toileting steps completed by patient: Performs perineal hygiene;Adjust clothing prior to toileting Toileting Assistive Devices: Grab bar or rail for support Toileting: 3: Mod-Patient completed 2 of 3 steps  FIM - Radio producer Devices: Grab bars Toilet Transfers: 4-To toilet/BSC: Min A (steadying Pt. > 75%);4-From toilet/BSC: Min A (steadying Pt. > 75%)  FIM - Bed/Chair Transfer Bed/Chair Transfer Assistive Devices: Arm rests;Bed rails;HOB elevated Bed/Chair Transfer: 4: Chair or W/C > Bed: Min  A (steadying Pt. > 75%);4: Bed > Chair or W/C: Min A (steadying Pt. > 75%)  FIM - Locomotion: Wheelchair Distance: 100 Locomotion: Wheelchair:  5: Travels 150 ft or more: maneuvers on rugs and over door sills with supervision, cueing or coaxing FIM - Locomotion: Ambulation Locomotion: Ambulation Assistive Devices: Administrator Ambulation/Gait Assistance: Not tested (comment) Locomotion: Ambulation: 0: Activity did not occur  Comprehension Comprehension Mode: Auditory Comprehension: 5-Understands complex 90% of the time/Cues < 10% of the time  Expression Expression Mode: Verbal Expression: 5-Expresses complex 90% of the time/cues < 10% of the time  Social Interaction Social Interaction: 4-Interacts appropriately 75 - 89% of the time - Needs redirection for appropriate language or to initiate interaction.  Problem Solving Problem Solving: 4-Solves basic 75 - 89% of the time/requires cueing 10 - 24% of the time  Memory Memory: 2-Recognizes or recalls 25 - 49% of the time/requires cueing 51 - 75% of the time  Medical Problem List and Plan:  TBI with skull fracture, left femural neck fracture with compression screw and left femoral shaft fracture with IM nail--NWB LLE.     1. DVT Prophylaxis/Anticoagulation: Mechanical: Sequential compression devices, below knee Bilateral lower extremities  2. Pain Management: Trauma/sickle cell crisis  -pain better, wbc's coming down  Will continue oxycodone. Increase fentanyl patch to 29mcg.   -wean IV morphine off  -xrays of back negative  -decrease  IVF. Encourage PO     3. Mood: i'm fine with cymbalta -seroquel is helping sleep 4. Neuropsych: This patient is not capable of making decisions on his own behalf.  5. 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 Hypokalemia: 3.4, continue supplementation 7. Occult intra-articular fracture of the lateral tibial plateau with intercondylar extension and hemarthrosis and probable MCL strain: Continue NWB, KI. May open KI  for 1 hour per day, only if supervised  8. Constipation:  bm's yesterday 9. Right shoulder---proximal,  greater tuberosity humerus fx's, sling, no ROM  -continue POC, ortho in agreement. 10. Low back pain. xrays negative (see above)  -lidoderm patches 11. Sickle Cell disease: ensure adequate hydration, pain control 12. Lethargy: neuro and behavioral  -CT shows increased shift--appreciate NS follow up  -neurologically he's stable  -sodium improved     LOS (Days) 12 A FACE TO FACE EVALUATION WAS PERFORMED  James Hamilton T 01/02/2014 8:55 AM

## 2014-01-02 NOTE — Progress Notes (Signed)
OccupationalTherapy Note  Patient Details  Name: Rowe Warman MRN: [id number] Date of Birth: [birth year] Today's Date: 01/02/2014   Time:  1430-1500  (30 min)   Pain:  7/10  Right leg    Individual session  Engaged in heat application to right thigh while doing LUE weights using  5 # weights.  Practiced with 10 # weights.  Left heat back and weight for pt to use for next hour.   Left pt in bed with call bell in place.       Lisa Roca 01/02/2014, 2:45 PM

## 2014-01-02 NOTE — Progress Notes (Signed)
Physical Therapy Session Note  Patient Details  Name: James Hamilton MRN: [id number] Date of Birth: 12/14/1985  Today's Date: 01/02/2014 Time:11:30-12:00 Treatment Time: 30 minutes   Short Term Goals: Week 1:  PT Short Term Goal 1 (Week 1): STGs=LTGs secondary to LOS  Therapy Documentation Precautions:  Precautions Precautions: Fall Precaution Comments: cognitive deficits Required Braces or Orthoses: Knee Immobilizer - Right;Sling Knee Immobilizer - Right: On at all times Restrictions Weight Bearing Restrictions: Yes RUE Weight Bearing: Non weight bearing RLE Weight Bearing: Non weight bearing Other Position/Activity Restrictions: R UE NWB as of 12/25/13 Pain: R knee Pain Score: 2   Patient resting in bed upon entry of PT. Patient lying in bed without knee immobilizer intact, patient reports that he removed during bathing and just had to get a break from it. Patient was lying in bed with R knee flexed ~ 50 degrees. Patient was instructed re; MD orders to be in knee immobilizer at all times and no ROM of R knee. Patient verbalized understanding.  Therapeutic Activity:(15') bed mobility supine to sit EOB with S/Mod-I using bed rail and R knee immobilizer intact. Transfer bed ->w/c toward Left with squat pivot transfer with S/Mod-I and patient observing NWB precautions for R UE/LE.        R  Knee immobilizer donning with patient involvement through 50% of activity.     Therapy/Group: Individual Therapy  Clearence Ped 01/02/2014, 5:06 PM

## 2014-01-03 ENCOUNTER — Inpatient Hospital Stay (HOSPITAL_COMMUNITY): Payer: Medicaid Other | Admitting: Physical Therapy

## 2014-01-03 ENCOUNTER — Inpatient Hospital Stay (HOSPITAL_COMMUNITY): Payer: Medicaid Other | Admitting: *Deleted

## 2014-01-03 LAB — BASIC METABOLIC PANEL
BUN: 6 mg/dL (ref 6–23)
CO2: 28 mEq/L (ref 19–32)
CREATININE: 0.89 mg/dL (ref 0.50–1.35)
Calcium: 8.7 mg/dL (ref 8.4–10.5)
Chloride: 102 mEq/L (ref 96–112)
GFR calc Af Amer: >90 mL/min (ref >90)
Glucose, Bld: 109 mg/dL — ABNORMAL HIGH (ref 70–99)
POTASSIUM: 3.9 meq/L (ref 3.7–5.3)
Sodium: 140 mEq/L (ref 137–147)

## 2014-01-03 NOTE — Progress Notes (Signed)
Good night. Still removing KI. James Hamilton

## 2014-01-03 NOTE — Progress Notes (Signed)
Occupational Therapy Note  Patient Details  Name: James Hamilton MRN: [id number] Date of Birth: 11/17/1985 Today's Date: 01/03/2014  Time:  1630-1730  (60 min) Pain:  7/10 right before heat; 5/10 after heat Individual session  Treatment focus: wc mobility,transfer training; therapeutic exercise and  activity tolerance.  Pt. Lying in bed.  Transferred from supin to EOB with SBA; transfer SBA with KI on right LE.  Pt propelled wc to gym.  Transferred to nustep and used LUE and LLE for 5 min at 3 wkld.  Transferred to mat and did 10 # weight to LUE for Exercise regime.  Pt. Transferred back to mat with SBA and one cue to lock one brake.  Propelled self back to room with no assist on level surface.  Transferred to EOB> BSC > bed with SBA.  Pt. Cleaned self after BM and managed pants with min assist.  Left pt in bed withll bed alarm on and call bell,phone within reach.      Lisa Roca 01/03/2014, 5:19 PM

## 2014-01-03 NOTE — Progress Notes (Signed)
Subjective/Complaints: Uneventful evening. Still with poor engagement, insight at times  A 12 point review of systems has been performed and if not noted above is otherwise negative.   Objective: Vital Signs: Blood pressure 115/68, pulse 99, temperature 98.4 F (36.9 C), temperature source Oral, resp. rate 18, weight 64.139 kg (141 lb 6.4 oz), SpO2 97.00%. No results found. No results found for this basename: WBC, HGB, HCT, PLT,  in the last 72 hours  Recent Labs  01/02/14 0537 01/03/14 0706  NA 141 140  K 3.8 3.9  CL 102 102  GLUCOSE 92 109*  BUN 8 6  CREATININE 1.05 0.89  CALCIUM 8.6 8.7   CBG (last 3)  No results found for this basename: GLUCAP,  in the last 72 hours  Wt Readings from Last 3 Encounters:  12/30/13 64.139 kg (141 lb 6.4 oz)  12/17/13 70.3 kg (154 lb 15.7 oz)  12/17/13 70.3 kg (154 lb 15.7 oz)    Physical Exam:  General: Awake, NAD HEENT: Head is normocephalic, atraumatic, PERRLA, EOMI, sclera anicteric, oral mucosa pink and moist, dentition intact, ext ear canals clear,  Neck: Supple without JVD or lymphadenopathy  Heart: Reg rate and rhythm. No murmurs rubs or gallops  Chest: CTA bilaterally without wheezes, rales, or rhonchi; no distress  Abdomen: Soft, non-tender, non-distended, bowel sounds positive.  Extremities: No clubbing, cyanosis, or edema. Pulses are 2+  Skin: Clean and intact without signs of breakdown. Right leg incisions all intact without drainage. Neuro: more alert, still distracted.   Answers basic questions regarding living situation.   Moves all 4's except for right leg. Senses pain in all 4's. 3-/5 R delt, 4/5 R bi, tri, grip, 5/5 in LUE and LLE, 2- R hip flex, 3/5 ankle Musculoskeletal: right leg in KI with ACE wrap over lower portion of leg.   Psych: Pt's affect is flat, remains distracted. Can be redirected   Assessment/Plan: 1. Functional deficits secondary to TBI, right femur,tibial plateau fx's which require 3+ hours  per day of interdisciplinary therapy in a comprehensive inpatient rehab setting. Physiatrist is providing close team supervision and 24 hour management of active medical problems listed below. Physiatrist and rehab team continue to assess barriers to discharge/monitor patient progress toward functional and medical goals.  Family has backed out and now is pursuing snf  FIM: FIM - Bathing Bathing Steps Patient Completed: Left Arm;Chest;Right Arm;Abdomen;Front perineal area;Left upper leg;Buttocks;Right upper leg;Left lower leg (including foot) Bathing: 4: Min-Patient completes 8-9 27f 10 parts or 75+ percent  FIM - Upper Body Dressing/Undressing Upper body dressing/undressing steps patient completed: Thread/unthread right sleeve of pullover shirt/dresss;Thread/unthread left sleeve of pullover shirt/dress;Put head through opening of pull over shirt/dress Upper body dressing/undressing: 4: Min-Patient completed 75 plus % of tasks FIM - Lower Body Dressing/Undressing Lower body dressing/undressing steps patient completed: Pull pants up/down Lower body dressing/undressing: 1: Total-Patient completed less than 25% of tasks  FIM - Toileting Toileting steps completed by patient: Performs perineal hygiene;Adjust clothing prior to toileting Toileting Assistive Devices: Grab bar or rail for support Toileting: 3: Mod-Patient completed 2 of 3 steps  FIM - Radio producer Devices: Grab bars Toilet Transfers: 4-To toilet/BSC: Min A (steadying Pt. > 75%);4-From toilet/BSC: Min A (steadying Pt. > 75%)  FIM - Bed/Chair Transfer Bed/Chair Transfer Assistive Devices: Arm rests;Bed rails;HOB elevated Bed/Chair Transfer: 4: Chair or W/C > Bed: Min A (steadying Pt. > 75%);4: Bed > Chair or W/C: Min A (steadying Pt. > 75%)  FIM - Locomotion: Wheelchair Distance: 100 Locomotion: Wheelchair: 5: Travels 150 ft or more: maneuvers on rugs and over door sills with supervision, cueing or  coaxing FIM - Locomotion: Ambulation Locomotion: Ambulation Assistive Devices: Administrator Ambulation/Gait Assistance: Not tested (comment) Locomotion: Ambulation: 0: Activity did not occur  Comprehension Comprehension Mode: Auditory Comprehension: 5-Understands complex 90% of the time/Cues < 10% of the time  Expression Expression Mode: Verbal Expression: 5-Expresses complex 90% of the time/cues < 10% of the time  Social Interaction Social Interaction: 4-Interacts appropriately 75 - 89% of the time - Needs redirection for appropriate language or to initiate interaction.  Problem Solving Problem Solving: 4-Solves basic 75 - 89% of the time/requires cueing 10 - 24% of the time  Memory Memory: 2-Recognizes or recalls 25 - 49% of the time/requires cueing 51 - 75% of the time  Medical Problem List and Plan:  TBI with skull fracture, left femural neck fracture with compression screw and left femoral shaft fracture with IM nail--NWB LLE.     1. DVT Prophylaxis/Anticoagulation: Mechanical: Sequential compression devices, below knee Bilateral lower extremities  2. Pain Management: Trauma/sickle cell crisis  -pain better, wbc's coming down  Will continue oxycodone. Increase fentanyl patch to 76mcg.   -wean IV morphine off--will continue today  -xrays of back negative  -stopped  IVF. Encourage PO     3. Mood: i'm fine with cymbalta -seroquel is helping sleep 4. Neuropsych: This patient is not capable of making decisions on his own behalf.  5. 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 Hypokalemia: 3.4, continue supplementation 7. Occult intra-articular fracture of the lateral tibial plateau with intercondylar extension and hemarthrosis and probable MCL strain: Continue NWB, KI. May open KI  for 1 hour per day, only if supervised  8. Constipation:  bm's yesterday 9. Right shoulder---proximal, greater tuberosity humerus fx's, sling, no ROM  -continue POC, ortho in  agreement. 10. Low back pain. xrays negative (see above)  -lidoderm patches 11. Sickle Cell disease: ensure adequate hydration, pain control 12. Lethargy: neuro and behavioral  -CT shows increased shift--appreciate NS follow up  -neurologically he's stable  -sodium improved     LOS (Days) 13 A FACE TO FACE EVALUATION WAS PERFORMED  Jashanti Clinkscale T 01/03/2014 10:35 AM

## 2014-01-03 NOTE — Progress Notes (Signed)
Physical Therapy Note  Patient Details  Name: Tysen Roesler MRN: [id number] Date of Birth: March 03, 1986 Today's Date: 01/03/2014  1300-1325 (25 minutes) individual Pain: 6/10 RT LE/ premedicated Focus of treatment : wc mobility ; transfer training; therapeutic exercise focused on activity tolerance Treatment: Pt in bed upon arrival; agreeable to PT session; supine to sit SBA; transfer SBA NWB RT extremities (knee immobilizer on right LE) with vcs for managing brakes; UE ergonomter X 10 minutes LT UE only for activity tolerance; returned to room with bed alarm activated.    Cadince Hilscher,JIM 01/03/2014, 1:28 PM

## 2014-01-04 ENCOUNTER — Inpatient Hospital Stay (HOSPITAL_COMMUNITY): Payer: Medicaid Other | Admitting: *Deleted

## 2014-01-04 ENCOUNTER — Encounter (HOSPITAL_COMMUNITY): Payer: PRIVATE HEALTH INSURANCE | Admitting: Occupational Therapy

## 2014-01-04 ENCOUNTER — Inpatient Hospital Stay (HOSPITAL_COMMUNITY): Payer: Medicaid Other | Admitting: Speech Pathology

## 2014-01-04 DIAGNOSIS — S020XXA Fracture of vault of skull, initial encounter for closed fracture: Secondary | ICD-10-CM

## 2014-01-04 DIAGNOSIS — S72309A Unspecified fracture of shaft of unspecified femur, initial encounter for closed fracture: Secondary | ICD-10-CM

## 2014-01-04 DIAGNOSIS — S72009A Fracture of unspecified part of neck of unspecified femur, initial encounter for closed fracture: Secondary | ICD-10-CM

## 2014-01-04 DIAGNOSIS — S06309A Unspecified focal traumatic brain injury with loss of consciousness of unspecified duration, initial encounter: Secondary | ICD-10-CM

## 2014-01-04 DIAGNOSIS — D5700 Hb-SS disease with crisis, unspecified: Secondary | ICD-10-CM

## 2014-01-04 DIAGNOSIS — F17200 Nicotine dependence, unspecified, uncomplicated: Secondary | ICD-10-CM

## 2014-01-04 DIAGNOSIS — IMO0002 Reserved for concepts with insufficient information to code with codable children: Secondary | ICD-10-CM

## 2014-01-04 LAB — BASIC METABOLIC PANEL
BUN: 8 mg/dL (ref 6–23)
CALCIUM: 8.7 mg/dL (ref 8.4–10.5)
CO2: 30 mEq/L (ref 19–32)
CREATININE: 0.98 mg/dL (ref 0.50–1.35)
Chloride: 104 mEq/L (ref 96–112)
GFR calc non Af Amer: >90 mL/min (ref >90)
Glucose, Bld: 97 mg/dL (ref 70–99)
Potassium: 4 mEq/L (ref 3.7–5.3)
Sodium: 145 mEq/L (ref 137–147)

## 2014-01-04 NOTE — Progress Notes (Signed)
Subjective/Complaints: Uneventful evening  A 12 point review of systems has been performed and if not noted above is otherwise negative.   Objective: Vital Signs: Blood pressure 120/64, pulse 105, temperature 98 F (36.7 C), temperature source Oral, resp. rate 18, weight 64.139 kg (141 lb 6.4 oz), SpO2 100.00%. No results found. No results found for this basename: WBC, HGB, HCT, PLT,  in the last 72 hours  Recent Labs  01/03/14 0706 01/04/14 0600  NA 140 145  K 3.9 4.0  CL 102 104  GLUCOSE 109* 97  BUN 6 8  CREATININE 0.89 0.98  CALCIUM 8.7 8.7   CBG (last 3)  No results found for this basename: GLUCAP,  in the last 72 hours  Wt Readings from Last 3 Encounters:  12/30/13 64.139 kg (141 lb 6.4 oz)  12/17/13 70.3 kg (154 lb 15.7 oz)  12/17/13 70.3 kg (154 lb 15.7 oz)    Physical Exam:  General: Awake, NAD HEENT: Head is normocephalic, atraumatic, PERRLA, EOMI, sclera anicteric, oral mucosa pink and moist, dentition intact, ext ear canals clear,  Neck: Supple without JVD or lymphadenopathy  Heart: Reg rate and rhythm. No murmurs rubs or gallops  Chest: CTA bilaterally without wheezes, rales, or rhonchi; no distress  Abdomen: Soft, non-tender, non-distended, bowel sounds positive.  Extremities: No clubbing, cyanosis, or edema. Pulses are 2+  Skin: Clean and intact without signs of breakdown. Right leg incisions all intact without drainage. Neuro: more alert, still distracted.   Answers basic questions regarding living situation.   Moves all 4's except for right leg. Senses pain in all 4's. 3-/5 R delt, 4/5 R bi, tri, grip, 5/5 in LUE and LLE, 2- R hip flex, 3/5 ankle Musculoskeletal: right leg in KI with ACE wrap over lower portion of leg.   Psych: Pt's affect is flat, remains distracted. Can be redirected   Assessment/Plan: 1. Functional deficits secondary to TBI, right femur,tibial plateau fx's which require 3+ hours per day of interdisciplinary therapy in a  comprehensive inpatient rehab setting. Physiatrist is providing close team supervision and 24 hour management of active medical problems listed below. Physiatrist and rehab team continue to assess barriers to discharge/monitor patient progress toward functional and medical goals.  Family has backed out and now is pursuing snf  FIM: FIM - Bathing Bathing Steps Patient Completed: Left Arm;Chest;Right Arm;Abdomen;Front perineal area;Left upper leg;Buttocks;Right upper leg;Left lower leg (including foot) Bathing: 4: Min-Patient completes 8-9 13f 10 parts or 75+ percent  FIM - Upper Body Dressing/Undressing Upper body dressing/undressing steps patient completed: Thread/unthread right sleeve of pullover shirt/dresss;Thread/unthread left sleeve of pullover shirt/dress;Put head through opening of pull over shirt/dress Upper body dressing/undressing: 4: Min-Patient completed 75 plus % of tasks FIM - Lower Body Dressing/Undressing Lower body dressing/undressing steps patient completed: Pull pants up/down Lower body dressing/undressing: 1: Total-Patient completed less than 25% of tasks  FIM - Toileting Toileting steps completed by patient: Performs perineal hygiene;Adjust clothing prior to toileting Toileting Assistive Devices: Grab bar or rail for support Toileting: 3: Mod-Patient completed 2 of 3 steps  FIM - Radio producer Devices: Grab bars Toilet Transfers: 4-To toilet/BSC: Min A (steadying Pt. > 75%);4-From toilet/BSC: Min A (steadying Pt. > 75%)  FIM - Bed/Chair Transfer Bed/Chair Transfer Assistive Devices: Arm rests;Bed rails;HOB elevated Bed/Chair Transfer: 4: Chair or W/C > Bed: Min A (steadying Pt. > 75%);4: Bed > Chair or W/C: Min A (steadying Pt. > 75%)  FIM - Locomotion: Wheelchair Distance: 100  Locomotion: Wheelchair: 5: Travels 150 ft or more: maneuvers on rugs and over door sills with supervision, cueing or coaxing FIM - Locomotion:  Ambulation Locomotion: Ambulation Assistive Devices: Administrator Ambulation/Gait Assistance: Not tested (comment) Locomotion: Ambulation: 0: Activity did not occur  Comprehension Comprehension Mode: Auditory Comprehension: 5-Understands complex 90% of the time/Cues < 10% of the time  Expression Expression Mode: Verbal Expression: 5-Expresses complex 90% of the time/cues < 10% of the time  Social Interaction Social Interaction: 4-Interacts appropriately 75 - 89% of the time - Needs redirection for appropriate language or to initiate interaction.  Problem Solving Problem Solving: 4-Solves basic 75 - 89% of the time/requires cueing 10 - 24% of the time  Memory Memory: 2-Recognizes or recalls 25 - 49% of the time/requires cueing 51 - 75% of the time  Medical Problem List and Plan:  TBI with skull fracture, left femural neck fracture with compression screw and left femoral shaft fracture with IM nail--NWB LLE.     1. DVT Prophylaxis/Anticoagulation: Mechanical: Sequential compression devices, below knee Bilateral lower extremities  2. Pain Management: Trauma/sickle cell crisis  -pain better, wbc's coming down  Will continue oxycodone. Increase fentanyl patch to 61mcg.   -wean IV morphine off--q12 prn only  -xrays of back negative  -stopped  IVF. Encourage PO     3. Mood: i'm fine with cymbalta -seroquel is helping sleep 4. Neuropsych: This patient is not capable of making decisions on his own behalf.  5. 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 Hypokalemia: 3.4, continue supplementation 7. Occult intra-articular fracture of the lateral tibial plateau with intercondylar extension and hemarthrosis and probable MCL strain: Continue NWB, KI. May open KI  for 1 hour per day, only if supervised  8. Constipation:  bm's yesterday 9. Right shoulder---proximal, greater tuberosity humerus fx's, sling, no ROM  -continue POC, ortho in agreement. 10. Low back pain. xrays  negative (see above)  -lidoderm patches 11. Sickle Cell disease: ensure adequate hydration, pain control 12. Lethargy: neuro and behavioral--generally resolved   -neurologically he's stable  -sodium nl   LOS (Days) 14 A FACE TO FACE EVALUATION WAS PERFORMED  Roshawn Lacina T 01/04/2014 7:47 AM

## 2014-01-04 NOTE — Progress Notes (Signed)
Speech Language Pathology Daily Session Note  Patient Details  Name: James Hamilton MRN: [id number] Date of Birth: [birth year]  Today's Date: 01/04/2014 Time: 4481-8563 Time Calculation (min): 25 min  Short Term Goals: Week 2: SLP Short Term Goal 1 (Week 2): Pt will consume current diet with minimal overt s/s of aspiration with Mod verbal cues for utilization of swallowing compensatory strategies.  SLP Short Term Goal 2 (Week 2): Pt will identify 2 cognitive impairments with Mod question cues.  SLP Short Term Goal 3 (Week 2): Pt will recall new, daily information with Mod A multimodal cueing  SLP Short Term Goal 4 (Week 2): Pt will demonstrate sustained attention to a functional task for 5 minutes with Max A verbal cues for redirection  SLP Short Term Goal 5 (Week 2): Pt will utilize call bell to request assistance with supervision question cues.   Skilled Therapeutic Interventions: Treatment focused on cognitive goals. Upon arrival, pt was alert and laying without his KI in place. Pt reported he took it off in the middle of the night but understood he needed it back on.  Pt demonstrated emergent awareness into decreased working memory with Mod I and demonstrated appropriate problem solving in regards to strategies to increase recall/carryover of newly learned information. Pt independently utilized a notebook to record his current pain medications and times they were administered. Pt also agreed to sit-up in his recliner for 45 minutes after his next session. Pt appeared overall brighter and cooperative throughout this session.  Continue with current plan of care.    FIM:  Comprehension Comprehension Mode: Auditory Comprehension: 5-Understands complex 90% of the time/Cues < 10% of the time Expression Expression Mode: Verbal Expression: 5-Expresses complex 90% of the time/cues < 10% of the time Social Interaction Social Interaction: 4-Interacts appropriately 75 - 89% of the time - Needs  redirection for appropriate language or to initiate interaction. Problem Solving Problem Solving: 4-Solves basic 75 - 89% of the time/requires cueing 10 - 24% of the time Memory Memory: 4-Recognizes or recalls 75 - 89% of the time/requires cueing 10 - 24% of the time FIM - Eating Eating Activity: 6: Swallowing techniques: self-managed;5: Supervision/cues  Pain Pain Assessment Pain Assessment: 0-10 Pain Score: 7  Pain Type: Acute pain Pain Location: Leg Pain Orientation: Right Pain Descriptors / Indicators: Aching Pain Onset: On-going Patients Stated Pain Goal: 3 Pain Intervention(s): Medication (See eMAR) Multiple Pain Sites: No  Therapy/Group: Individual Therapy  Jackelin Correia 01/04/2014, 12:47 PM

## 2014-01-04 NOTE — Progress Notes (Signed)
Physical Therapy Session Note  Patient Details  Name: James Hamilton MRN: [id number] Date of Birth: 1986/09/18  Today's Date: 01/04/2014 Time: 5366-4403 Time Calculation (min): 54 min  Short Term Goals: Week 2:  PT Short Term Goal 1 (Week 2): =LTG  Skilled Therapeutic Interventions/Progress Updates:    Patient received sitting in recliner. Session focused on toileting, functional transfers, and wheelchair mobility. Patient with reports to use bathroom, transfer recliner>wheelchair<>toilet with minA via squat pivot with PT holding R hand of patient to maintain NWB precautions. Patient able to perform hygiene from seated position. Wheelchair mobility 115' x1 and 37' x1 in L one arm drive wheelchair and minA for obstacle negotiation. Wheelchair obstacle course: weaving in/out of 3 cones and across floor mat, requires minA. Patient with good insight/awareness that reaching for obstacles while seated in wheelchair may be difficult. Patient practiced reaching for objects at different levels and introduced use of reacher when necessary. Patient able to demonstrate good compensatory strategies for items out of reach (use of reacher, use of L LE to slide object on floor closer, repositioning wheelchair to get closer to object), all without cues. Patient returned to room and left supine in bed with all needs within reach, 3 rails up, and bed alarm on.  Therapy Documentation Precautions:  Precautions Precautions: Fall Required Braces or Orthoses: Knee Immobilizer - Right;Sling Knee Immobilizer - Right: On at all times Restrictions Weight Bearing Restrictions: Yes RUE Weight Bearing: Non weight bearing RLE Weight Bearing: Non weight bearing Other Position/Activity Restrictions: R UE NWB as of 12/25/13 Pain: Pain Assessment Pain Assessment: 0-10 Pain Score: 5  Pain Type: Surgical pain Pain Location: Leg Pain Orientation: Right Pain Descriptors / Indicators: Aching;Sore Pain Onset:  On-going Pain Intervention(s): RN made aware;Repositioned;Ambulation/increased activity Multiple Pain Sites: No Locomotion : Ambulation Ambulation/Gait Assistance: Not tested (comment) Wheelchair Mobility Distance: 115   See FIM for current functional status  Therapy/Group: Individual Therapy  Lillia Abed. [name], PT, DPT 01/04/2014, 12:17 PM

## 2014-01-04 NOTE — Progress Notes (Signed)
PHYSICIAN PROGRESS NOTE  James Hamilton BOF:[id number] DOB: 31-Oct-1986 DOA: 12/21/2013  01/04/2014   PCP: No primary provider on file.  Assessment/Plan: 1. Acute Hb SS Crisis - Pt reports that overall pain is improved. Continue fentanyl 25 mcg, oxycodone 10 mg Q6h.  Complete toradol after noon dose today. IVFs discontinued. Encouraging p.o.  2. Constipation - continue stool softeners, Pt has had BMs reported yesterday.  3. Hypokalemia - replacing orally, recommend follow BMP 4. Chronic Anemia - Hg has been stable, follow.  5. 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  6. Mood Disorders - per primary team, pt on cymbalta and seroquel  Pt is stable from sickle cell standpoint at this time.  Will sign off.  Please call with questions or re-consult as needed. Thank you for this consultation.    Code Status: FULL  HPI/Subjective: Pt reports that overall his pain in legs and right arm has improved.   Objective: Filed Vitals:   01/04/14 0442  BP: 120/64  Pulse: 105  Temp: 98 F (36.7 C)  Resp: 18    Intake/Output Summary (Last 24 hours) at 01/04/14 1349 Last data filed at 01/04/14 1230  Gross per 24 hour  Intake   1080 ml  Output    200 ml  Net    880 ml   Filed Weights   12/23/13 0539 12/30/13 1828  Weight: 146 lb 9.6 oz (66.497 kg) 141 lb 6.4 oz (64.139 kg)   Exam:  General:  Awake, alert, no apparent distress  Cardiovascular: normal s1, s2 sounds   Respiratory: BBS clear to auscultation  Abdomen: soft, nondistended, nontender  Musculoskeletal: right shoulder/arm in sling  Data Reviewed: Basic Metabolic Panel:  Recent Labs Lab 12/31/13 0602 01/01/14 0450 01/02/14 0537 01/03/14 0706 01/04/14 0600  NA 137 137 141 140 145  K 4.2 4.0 3.8 3.9 4.0  CL 100 99 102 102 104  CO2 30 26 30 28 30   GLUCOSE 98 120* 92 109* 97  BUN 16 8 8 6 8   CREATININE 1.04 0.94 1.05 0.89 0.98  CALCIUM 8.9 8.9 8.6 8.7 8.7   Liver  Function Tests:  Recent Labs Lab 12/29/13 1550  AST 30  ALT 41  ALKPHOS 129*  BILITOT 0.8  PROT 8.1  ALBUMIN 3.5   No results found for this basename: LIPASE, AMYLASE,  in the last 168 hours No results found for this basename: AMMONIA,  in the last 168 hours CBC:  Recent Labs Lab 12/29/13 0550 12/31/13 1030  WBC 14.3* 8.5  NEUTROABS 11.9*  --   HGB 10.1* 9.2*  HCT 30.2* 27.3*  MCV 75.1* 75.6*  PLT 513* 469*   Cardiac Enzymes: No results found for this basename: CKTOTAL, CKMB, CKMBINDEX, TROPONINI,  in the last 168 hours BNP (last 3 results) No results found for this basename: PROBNP,  in the last 8760 hours CBG: No results found for this basename: GLUCAP,  in the last 168 hours  Recent Results (from the past 240 hour(s))  URINE CULTURE     Status: None   Collection Time    12/28/13  4:17 PM      Result Value Ref Range Status   Specimen Description URINE, CLEAN CATCH   Final   Special Requests NONE   Final   Culture  Setup Time     Final   Value: 12/28/2013 20:03     Performed at [place]     Final   Value:  NO GROWTH     Performed at [facility]   Culture     Final   Value: NO GROWTH     Performed at [facility]   Report Status 12/29/2013 FINAL   Final    Studies: No results found.  Scheduled Meds: . DULoxetine  20 mg Oral Daily  . fentaNYL  12.5 mcg Transdermal Q72H  . ferrous sulfate  325 mg Oral BID WC  . folic acid  1 mg Oral Daily  . ketorolac  30 mg Intravenous 4 times per day  . lidocaine  2 patch Transdermal Daily  . nicotine  14 mg Transdermal Daily  . oxyCODONE  10 mg Oral 4 times per day  . pantoprazole  40 mg Oral Daily  . potassium chloride  20 mEq Oral Daily  . QUEtiapine  50 mg Oral QHS  . senna-docusate  3 tablet Oral BID WC   Continuous Infusions:   Active Problems:   Hypokalemia   Hip fracture, right   Femur fracture, right   Skull fracture   Sickle cell disease   Acute blood  loss anemia   TBI (traumatic brain injury)  [place] [employer] Pager [phone]. If 7PM-7AM, please contact night-coverage at [email or web address], password [email or web address] 01/04/2014, 1:49 PM  LOS: 14 days

## 2014-01-04 NOTE — Progress Notes (Signed)
Occupational Therapy Session Note  Patient Details  Name: James Hamilton MRN: [id number] Date of Birth: Oct 21, 1986  Today's Date: 01/04/2014 Time: 0930-1010 Time Calculation (min): 40 min  Short Term Goals: Week 1:  OT Short Term Goal 1 (Week 1): pt will demonstrate sustain attention for 1 minute to adl task OT Short Term Goal 1 - Progress (Week 1): Progressing toward goal OT Short Term Goal 2 (Week 1): Pt will demonstrate bed mobility HOB flat with min (A) OT Short Term Goal 2 - Progress (Week 1): Progressing toward goal (needs cues for NWB RT UE) OT Short Term Goal 3 (Week 1): Pt will complete toilet transfer Min (A)  OT Short Term Goal 3 - Progress (Week 1): Progressing toward goal OT Short Term Goal 4 (Week 1): pt will complete grooming task min verbal cues and min (A) OT Short Term Goal 4 - Progress (Week 1): Progressing toward goal  Week 2:  OT Short Term Goal 1 (Week 2): WEEK 1 GOALS REMAIN APPROPRIATE  Skilled Therapeutic Interventions/Progress Updates:  Upon entering room, patient found supine in bed with no direct complaints of pain. Patient performed UB/LB bathing in supine position. No clean clothes, therefore patient kept pants on and donned same shirt after UB bathing. Patient able to initiate and stay on task for bathing tasks this am. Patient sat edge of bed and therapist donned sling > RUE. Patient then transferred EOB> recliner with steady assist. Patient sat at sink in recliner for grooming task of brushing teeth and shaving. Patient initiated all grooming tasks independently. From here, patient left seated in recliner beside bed with all needed items within reach. Therapist administered hot pack from gym for RLE discomfort. Therapist also put patient's dirty clothes in washer and notified RN.   Precautions:  Precautions Precautions: Fall Precaution Comments: cognitive deficits Required Braces or Orthoses: Knee Immobilizer - Right;Sling Knee Immobilizer - Right: On  at all times Restrictions Weight Bearing Restrictions: Yes RUE Weight Bearing: Non weight bearing RLE Weight Bearing: Non weight bearing Other Position/Activity Restrictions: R UE NWB as of 12/25/13  See FIM for current functional status  Therapy/Group: Individual Therapy  Autie Vasudevan 01/04/2014, 10:37 AM

## 2014-01-05 ENCOUNTER — Inpatient Hospital Stay (HOSPITAL_COMMUNITY): Payer: Medicaid Other | Admitting: *Deleted

## 2014-01-05 ENCOUNTER — Inpatient Hospital Stay (HOSPITAL_COMMUNITY): Payer: Medicaid Other | Admitting: Speech Pathology

## 2014-01-05 ENCOUNTER — Inpatient Hospital Stay (HOSPITAL_COMMUNITY): Payer: Medicaid Other

## 2014-01-05 LAB — BASIC METABOLIC PANEL
BUN: 7 mg/dL (ref 6–23)
CALCIUM: 8.7 mg/dL (ref 8.4–10.5)
CO2: 30 mEq/L (ref 19–32)
Chloride: 103 mEq/L (ref 96–112)
Creatinine, Ser: 1.01 mg/dL (ref 0.50–1.35)
GFR calc Af Amer: >90 mL/min (ref >90)
Glucose, Bld: 104 mg/dL — ABNORMAL HIGH (ref 70–99)
Potassium: 4.1 mEq/L (ref 3.7–5.3)
Sodium: 143 mEq/L (ref 137–147)
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
## 2014-01-05 NOTE — Progress Notes (Signed)
Physical Therapy Weekly Progress Note  Patient Details  Name: James Hamilton MRN: [id number] Date of Birth: [birth year]  Today's Date: 01/05/2014 Time: 0900-0930 Time Calculation (min): 30 min  Patient has met  8 of 8 long term goals.  Short term goals not set due to estimated length of stay.  Patient was making good progress in initial days on rehab. After first week, patient began demonstrating increased resistance with therapy as well as other staff members with regards to all aspects of care. Patient continued decreased participation as well as non-acceptance of precautions, impulsivity, poor safety awareness, and poor motivation to improve. Patient was demonstrating intermittent mild verbal and physical bouts of agitation, i.e., pushing therapists/staff members away when assisting, removing KI and sling immediately after staff member dons, non-adherence to precautions, etc. Patient was unable to maintain NWB precautions on R UE/LE, however was able to verbalize his precautions, verbalize the consequences of weight bearing, yet, in the moment when patient was reminded of precautions, stated "It's easier this way" or "I'm going to do it the way I want." Patient continued to require Max-total A for initiation, sustained attention, intellectual awareness, and safety awareness. For the reasons stated above, all patient's LTGs downgraded last reporting period to minA overall.  Family education completed with patient's mom on 12/30/13, however patient only cooperative enough for mom to practice one bed<>wheelchair transfer before patient refused to continue family education session. Extensive discussion about discharge recommendations and 24/7 assist/supervision. At the time, patient's mom verbalized understanding and in agreement of recommendations. Plan of care/discharge plan has now changed secondary to patient's mom unable to provide 24/7 assist/superviison. Pursuing SNF placement at this time. Patient  QD'd for therapy secondary to low participation levels and now has met all LTGs. Patient would continue to benefit from skilled PT intervention to improve strength and overall functional mobility while maintaining NWB status on R UE/LE and potentially advance to overall supervision level of assistance. However, patient continues to be noncompliant with NWB precautions and use of R UE sling and R LE knee immobilizer.  Patient continues to demonstrate the following deficits: poor safety awareness, poor adherence to NWB precautions on R UE/LE, impulsivity, poor activity tolerance, high reported pain levels, poor postural control in sitting, decreased strength, decreased attention, decreased coordination and therefore will continue to benefit from skilled PT intervention to enhance overall performance with activity tolerance, balance, postural control, ability to compensate for deficits, functional use of  right upper extremity and right lower extremity, attention, awareness, coordination and knowledge of precautions.  See Patient's Care Plan for progression toward long term goals.  Patient has met all LTGs and therefore, goals upgraded to supervision level overall.  Continue plan of care.  Skilled Therapeutic Interventions/Progress Updates:    Patient received supine in bed. Session focused on functional transfers while maintaining NWB precautions on R UE/LE and LE stretching/therex. Patient supine>sit with supervision, verbal cues for maintaining NWB precautions on R UE and patient continues to push up with R UE. Squat pivot transfer bed>recliner with min guard secondary to therapist holding R hand to maintain NWB precautions on R UE. Seated in recliner: L LE SLR, heel slides, B ankle pumps 2x10 and B gastroc/soleus stretch 2 x30", L piriformis "4" stretch. Patient reports he can stay sitting up in recliner until next session. Patient left seated in recliner with all needs within reach, RN aware.  Therapy  Documentation Precautions:  Precautions Precautions: Fall Precaution Comments: cognitive deficits Required Braces or Orthoses: Knee Immobilizer -  Right;Sling Knee Immobilizer - Right: On at all times Restrictions Weight Bearing Restrictions: Yes RUE Weight Bearing: Non weight bearing RLE Weight Bearing: Non weight bearing Other Position/Activity Restrictions: R UE NWB as of 12/25/13 Pain: Pain Assessment Pain Assessment: 0-10 Pain Score: 4  Pain Type: Surgical pain Pain Location: Leg Pain Orientation: Right Pain Descriptors / Indicators: Aching;Sore Pain Onset: On-going Pain Intervention(s): RN made aware;Repositioned;Ambulation/increased activity Multiple Pain Sites: No Locomotion : Ambulation Ambulation/Gait Assistance: Not tested (comment)   See FIM for current functional status  Therapy/Group: Individual Therapy  Lillia Abed. Jolina Symonds, PT, DPT 01/05/2014, 12:02 PM

## 2014-01-05 NOTE — Progress Notes (Signed)
Speech Language Pathology Weekly Progress and Session Note  Patient Details  Name: James Hamilton MRN: [id number] Date of Birth: 11/11/1986  Today's Date: 01/05/2014 Time: 1120-1205 Time Calculation (min): 45 min  Short Term Goals: Week 2: SLP Short Term Goal 1 (Week 2): Pt will consume current diet with minimal overt s/s of aspiration with Mod verbal cues for utilization of swallowing compensatory strategies.  SLP Short Term Goal 1 - Progress (Week 2): Met SLP Short Term Goal 2 (Week 2): Pt will identify 2 cognitive impairments with Mod question cues.  SLP Short Term Goal 2 - Progress (Week 2): Met SLP Short Term Goal 3 (Week 2): Pt will recall new, daily information with Mod A multimodal cueing  SLP Short Term Goal 3 - Progress (Week 2): Met SLP Short Term Goal 4 (Week 2): Pt will demonstrate sustained attention to a functional task for 5 minutes with Max A verbal cues for redirection  SLP Short Term Goal 4 - Progress (Week 2): Met SLP Short Term Goal 5 (Week 2): Pt will utilize call bell to request assistance with supervision question cues.  SLP Short Term Goal 5 - Progress (Week 2): Met    New Short Term Goals: Week 3: SLP Short Term Goal 1 (Week 3): Pt will utilize memory compensatory strategies to increase recall new, daily information with Mod I.  SLP Short Term Goal 2 (Week 3): Pt will demonstrate problem solving for complex tasks with Mod I.  SLP Short Term Goal 3 (Week 3): Pt will direct care in regards to transfers, etc. with Mod I.   Weekly Progress Updates: Pt has made functional gains and has met 5 of 5 STG's this reporting period due to increased overall participation and cooperation with treatment efforts. Initially, pt required total A for participation in therapy efforts and discharge plan was changed to SNF placement due to pt's family unable to provide 24 hour supervision at this time. However, within the last 5 days, the pt has had increased cooperation with  increased overall cognitive function. Currently, pt requires overall supervision cueing for complex problem solving, working memory with utilization of strategies and directing his care in regards to transfers, etc. Pt is utilizing his call bell to request wants/needs but continues to demonstrate decreased ability to maintain NWB precautions on R UE/LE, however he is able to verbalize his precautions, verbalize the consequences of weight bearing, yet, in the moment, pt continues to be noncompliant. Pt would benefit from continued skilled SLP intervention to maximize pt's overall cognitive function and functional independence prior to discharge.    Intensity: Minumum of 1-2 x/day, 30 to 90 minutes Frequency: 5 out of 7 days Duration/Length of Stay: TBD due to SNF placement  Treatment/Interventions: Cognitive remediation/compensation;Cueing hierarchy;Functional tasks;Environmental controls;Internal/external aids;Dysphagia/aspiration precaution training;Patient/family education;Therapeutic Activities;Speech/Language facilitation   Daily Session  Skilled Therapeutic Interventions: Skilled treatment session focused on cognitive-linguistic goals. SLP facilitated session by providing supervision question cues for utilization of memory compensatory strategies for writing information down to increase recall of newly learned information. Pt administered the Montreal Cognitive Assesment (MOCA) and scored a 23/30 which is considered "below normal." Pt dmeonstrated difficulty in the areas of memory and generative naming/categorizing. Pt demonstrated emergent awareness into difficulty and verbalized understanding of all information presented.        FIM:  Comprehension Comprehension Mode: Auditory Comprehension: 5-Understands complex 90% of the time/Cues < 10% of the time Expression Expression Mode: Verbal Expression: 5-Expresses complex 90% of the time/cues < 10% of the time   Social Interaction Social  Interaction: 5-Interacts appropriately 90% of the time - Needs monitoring or encouragement for participation or interaction. Problem Solving Problem Solving: 5-Solves basic 90% of the time/requires cueing < 10% of the time Memory Memory: 5-Recognizes or recalls 90% of the time/requires cueing < 10% of the time Pain Pain Assessment Pain Assessment: 0-10 Pain Score: 4  Pain Type: Surgical pain Pain Location: Leg Pain Orientation: Right Pain Descriptors / Indicators: Aching;Sore Pain Onset: On-going Pain Intervention(s): RN made aware;Repositioned;Ambulation/increased activity Multiple Pain Sites: No  Therapy/Group: Individual Therapy  [place] Journey, [place] 01/05/2014, 3:12 PM

## 2014-01-05 NOTE — Plan of Care (Signed)
Problem: RH Furniture Transfers Goal: LTG Patient will perform furniture transfers w/assist (OT/PT LTG: Patient will perform furniture transfers with assistance (OT/PT).  upgraded 01/05/14 due to improved participation  Problem: RH Bed to Chair Transfers Goal: LTG Patient will perform bed/chair transfers w/assist (PT) LTG: Patient will perform bed/chair transfers with assistance, with/without cues (PT).  upgraded 01/05/14 due to improved participation  Problem: RH Car Transfers Goal: LTG Patient will perform car transfers with assist (PT) LTG: Patient will perform car transfers with assistance (PT).  upgraded 01/05/14 due to improved participation  Problem: RH Furniture Transfers Goal: LTG Patient will perform furniture transfers w/assist (OT/PT LTG: Patient will perform furniture transfers with assistance (OT/PT).  upgraded 01/05/14 due to improved participation  Problem: RH Wheelchair Mobility Goal: LTG Patient will propel w/c in controlled environment (PT) LTG: Patient will propel wheelchair in controlled environment, # of feet with assist (PT)  upgraded 01/05/14 due to improved participation Goal: LTG Patient will propel w/c in home environment (PT) LTG: Patient will propel wheelchair in home environment, # of feet with assistance (PT).  upgraded 01/05/14 due to improved participation  Problem: RH Memory Goal: LTG Patient demonstrate ability for day to day recall (PT) LTG: Patient will demonstrate ability for day to day recall/carryover during mobility activities with assist (PT)  upgraded 01/05/14 due to improved participation

## 2014-01-05 NOTE — Progress Notes (Signed)
Subjective/Complaints: No complaints. Awake this am. Right knee still sore. Not wearing KI  A 12 point review of systems has been performed and if not noted above is otherwise negative.   Objective: Vital Signs: Blood pressure 123/65, pulse 83, temperature 98.2 F (36.8 C), temperature source Oral, resp. rate 17, weight 64.139 kg (141 lb 6.4 oz), SpO2 99.00%. No results found. No results found for this basename: WBC, HGB, HCT, PLT,  in the last 72 hours  Recent Labs  01/04/14 0600 01/05/14 0454  NA 145 143  K 4.0 4.1  CL 104 103  GLUCOSE 97 104*  BUN 8 7  CREATININE 0.98 1.01  CALCIUM 8.7 8.7   CBG (last 3)  No results found for this basename: GLUCAP,  in the last 72 hours  Wt Readings from Last 3 Encounters:  12/30/13 64.139 kg (141 lb 6.4 oz)  12/17/13 70.3 kg (154 lb 15.7 oz)  12/17/13 70.3 kg (154 lb 15.7 oz)    Physical Exam:  General: Awake, NAD HEENT: Head is normocephalic, atraumatic, PERRLA, EOMI, sclera anicteric, oral mucosa pink and moist, dentition intact, ext ear canals clear,  Neck: Supple without JVD or lymphadenopathy  Heart: Reg rate and rhythm. No murmurs rubs or gallops  Chest: CTA bilaterally without wheezes, rales, or rhonchi; no distress  Abdomen: Soft, non-tender, non-distended, bowel sounds positive.  Extremities: No clubbing, cyanosis, or edema. Pulses are 2+  Skin: Clean and intact without signs of breakdown. Right leg incisions all intact without drainage. Neuro: more alert, still distracted.   Answers basic questions regarding living situation.   Moves all 4's except for right leg. Senses pain in all 4's. 3-/5 R delt, 4/5 R bi, tri, grip, 5/5 in LUE and LLE, 2- R hip flex, 3/5 ankle Musculoskeletal: right leg in KI with ACE wrap over lower portion of leg.   Psych: Pt's affect is flat, remains distracted. Can be redirected   Assessment/Plan: 1. Functional deficits secondary to TBI, right femur,tibial plateau fx's which require 3+  hours per day of interdisciplinary therapy in a comprehensive inpatient rehab setting. Physiatrist is providing close team supervision and 24 hour management of active medical problems listed below. Physiatrist and rehab team continue to assess barriers to discharge/monitor patient progress toward functional and medical goals.  Family has backed out and now is pursuing snf  FIM: FIM - Bathing Bathing Steps Patient Completed: Left Arm;Chest;Right Arm;Abdomen;Front perineal area;Left upper leg;Buttocks;Right upper leg;Left lower leg (including foot) Bathing: 4: Min-Patient completes 8-9 41f 10 parts or 75+ percent  FIM - Upper Body Dressing/Undressing Upper body dressing/undressing steps patient completed: Thread/unthread right sleeve of pullover shirt/dresss;Thread/unthread left sleeve of pullover shirt/dress;Put head through opening of pull over shirt/dress Upper body dressing/undressing: 4: Min-Patient completed 75 plus % of tasks FIM - Lower Body Dressing/Undressing Lower body dressing/undressing steps patient completed: Pull pants up/down Lower body dressing/undressing: 1: Total-Patient completed less than 25% of tasks  FIM - Toileting Toileting steps completed by patient: Adjust clothing prior to toileting;Performs perineal hygiene Toileting Assistive Devices: Grab bar or rail for support Toileting: 3: Mod-Patient completed 2 of 3 steps  FIM - Radio producer Devices: Grab bars Toilet Transfers: 4-To toilet/BSC: Min A (steadying Pt. > 75%);4-From toilet/BSC: Min A (steadying Pt. > 75%)  FIM - Bed/Chair Transfer Bed/Chair Transfer Assistive Devices: Arm rests Bed/Chair Transfer: 5: Sit > Supine: Supervision (verbal cues/safety issues);4: Bed > Chair or W/C: Min A (steadying Pt. > 75%);4: Chair or W/C >  Bed: Min A (steadying Pt. > 75%)  FIM - Locomotion: Wheelchair Distance: 115 Locomotion: Wheelchair: 2: Travels 50 - 149 ft with minimal assistance  (Pt.>75%) FIM - Locomotion: Ambulation Locomotion: Ambulation Assistive Devices: Administrator Ambulation/Gait Assistance: Not tested (comment) Locomotion: Ambulation: 0: Activity did not occur  Comprehension Comprehension Mode: Auditory Comprehension: 5-Understands complex 90% of the time/Cues < 10% of the time  Expression Expression Mode: Verbal Expression: 5-Expresses complex 90% of the time/cues < 10% of the time  Social Interaction Social Interaction: 4-Interacts appropriately 75 - 89% of the time - Needs redirection for appropriate language or to initiate interaction.  Problem Solving Problem Solving: 4-Solves basic 75 - 89% of the time/requires cueing 10 - 24% of the time  Memory Memory: 4-Recognizes or recalls 75 - 89% of the time/requires cueing 10 - 24% of the time  Medical Problem List and Plan:  TBI with skull fracture, left femural neck fracture with compression screw and left femoral shaft fracture with IM nail--NWB LLE.     1. DVT Prophylaxis/Anticoagulation: Mechanical: Sequential compression devices, below knee Bilateral lower extremities  2. Pain Management: Trauma/sickle cell crisis  -pain better, wbc's coming down  Will continue oxycodone. Increase fentanyl patch to 41mcg.   -wean IV morphine off today  -xrays of back negative  -stopped  IVF. Encourage PO     3. Mood: i'm fine with cymbalta -seroquel is helping sleep 4. Neuropsych: This patient is not capable of making decisions on his own behalf.  5. 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 Hypokalemia: 3.4, continue supplementation 7. Occult intra-articular fracture of the lateral tibial plateau with intercondylar extension and hemarthrosis and probable MCL strain: Continue NWB, reviewed with patient why it's important to wear his KI ---he expressed an understanding and wanted to replace his brace  8. Constipation:  bm's yesterday 9. Right shoulder---proximal, greater tuberosity  humerus fx's, sling, no ROM  -continue POC, ortho in agreement. 10. Low back pain. xrays negative  -lidoderm patches 11. Sickle Cell disease: ensure adequate hydration, pain control 12. Lethargy: neuro and behavioral--generally resolved   -neurologically he's stable  -sodium nl   LOS (Days) 15 A FACE TO FACE EVALUATION WAS PERFORMED  Marina Desire T 01/05/2014 8:16 AM

## 2014-01-05 NOTE — Progress Notes (Signed)
Occupational Therapy Session Note  Patient Details  Name: James Hamilton MRN: [id number] Date of Birth: 1986/05/22  Today's Date: 01/05/2014 Time: 0388-8280 Time Calculation (min): 45 min  Short Term Goals: Week 2:  OT Short Term Goal 1 (Week 2): WEEK 1 GOALS REMAIN APPROPRIATE  Skilled Therapeutic Interventions/Progress Updates:  ADL-retraining with focus on transfers, orthotics management during ADL, and adherence to NMB at R-U/LE.   Patient received in recliner, receptive to planned ADL while supine in bed.   Patient able to verbalize NWB and AROM precautions but disregards precautions during activity, despite cues to use AE to maintain precautions.   Issued long sponge this session with some success with limiting incidental flexion of right knee during bathing.   Following assisted bathing and dressing, patient was fitted with sling to restrict AROM of right shoulder, as indicated.     Therapy Documentation Precautions:  Precautions Precautions: Fall Precaution Comments: cognitive deficits Required Braces or Orthoses: Knee Immobilizer - Right;Sling Knee Immobilizer - Right: On at all times Restrictions Weight Bearing Restrictions: Yes RUE Weight Bearing: Non weight bearing RLE Weight Bearing: Non weight bearing Other Position/Activity Restrictions: R UE NWB as of 12/25/13  Pain: No pain    See FIM for current functional status  Therapy/Group: Individual Therapy  [place] 01/05/2014, 4:15 PM

## 2014-01-06 ENCOUNTER — Encounter (HOSPITAL_COMMUNITY): Payer: PRIVATE HEALTH INSURANCE

## 2014-01-06 ENCOUNTER — Inpatient Hospital Stay (HOSPITAL_COMMUNITY): Payer: Medicaid Other | Admitting: *Deleted

## 2014-01-06 ENCOUNTER — Encounter (HOSPITAL_COMMUNITY): Payer: PRIVATE HEALTH INSURANCE | Admitting: Occupational Therapy

## 2014-01-06 ENCOUNTER — Inpatient Hospital Stay (HOSPITAL_COMMUNITY): Payer: Medicaid Other

## 2014-01-06 LAB — CBC
HCT: 29.8 % — ABNORMAL LOW (ref 39.0–52.0)
Hemoglobin: 9.9 g/dL — ABNORMAL LOW (ref 13.0–17.0)
MCH: 25 pg — ABNORMAL LOW (ref 26.0–34.0)
MCHC: 33.2 g/dL (ref 30.0–36.0)
MCV: 75.3 fL — ABNORMAL LOW (ref 78.0–100.0)
PLATELETS: 414 10*3/uL — AB (ref 150–400)
RBC: 3.96 MIL/uL — AB (ref 4.22–5.81)
RDW: 17 % — AB (ref 11.5–15.5)
WBC: 6.1 10*3/uL (ref 4.0–10.5)

## 2014-01-06 LAB — BASIC METABOLIC PANEL
BUN: 6 mg/dL (ref 6–23)
CHLORIDE: 100 meq/L (ref 96–112)
CO2: 31 meq/L (ref 19–32)
Calcium: 9 mg/dL (ref 8.4–10.5)
Creatinine, Ser: 0.88 mg/dL (ref 0.50–1.35)
GFR calc non Af Amer: >90 mL/min (ref >90)
Glucose, Bld: 88 mg/dL (ref 70–99)
Potassium: 4.1 mEq/L (ref 3.7–5.3)
SODIUM: 142 meq/L (ref 137–147)

## 2014-01-06 MED ORDER — OXYCODONE HCL 5 MG PO TABS
10.0000 mg | ORAL_TABLET | Freq: Four times a day (QID) | ORAL | Status: DC | PRN
  Administered 2014-01-06 – 2014-01-07 (×3): 10 mg via ORAL
  Filled 2014-01-06 (×3): qty 2

## 2014-01-06 MED ORDER — FENTANYL 25 MCG/HR TD PT72
25.0000 ug | MEDICATED_PATCH | TRANSDERMAL | Status: DC
  Administered 2014-01-07 – 2014-01-10 (×2): 25 ug via TRANSDERMAL
  Filled 2014-01-06 (×2): qty 1

## 2014-01-06 NOTE — Progress Notes (Signed)
1Speech Language Pathology Daily Session Note  Patient Details  Name: James Hamilton MRN: [id number] Date of Birth: 1986-08-27  Today's Date: 01/06/2014 Time: 5027-7412 Time Calculation (min): 27 min  Short Term Goals: Week 3: SLP Short Term Goal 1 (Week 3): Pt will utilize memory compensatory strategies to increase recall new, daily information with Mod I.  SLP Short Term Goal 2 (Week 3): Pt will demonstrate problem solving for complex tasks with Mod I.  SLP Short Term Goal 3 (Week 3): Pt will direct care in regards to transfers, etc. with Mod I.   Skilled Therapeutic Interventions: Skilled treatment focused on cognitive goals. SLP facilitated session with use of notebook to create written external aids. Pt demonstrated emergent awareness of difficulty remembering daily events clearly, and with supervision level verbal cueing decided that he wanted to write down therapy activities in his notebook. Pt required supervision level question cues to be more specific when writing down events. Pt demonstrated anticipatory awareness by starting a log for tomorrow, saying that if he "didn't do it right now, he wouldn't do it." Continue plan of care.   FIM:  Comprehension Comprehension Mode: Auditory Comprehension: 5-Follows basic conversation/direction: With no assist Expression Expression Mode: Verbal Expression: 5-Expresses basic needs/ideas: With no assist Social Interaction Social Interaction: 5-Interacts appropriately 90% of the time - Needs monitoring or encouragement for participation or interaction. Problem Solving Problem Solving: 5-Solves basic 90% of the time/requires cueing < 10% of the time Memory Memory: 5-Recognizes or recalls 90% of the time/requires cueing < 10% of the time  Pain Pain Assessment Pain Assessment: No/denies pain Pain Score: 7  Pain Type: Acute pain Pain Location: Other (Comment) (thigh) Pain Orientation: Right;Upper;Lateral Pain Descriptors / Indicators:  Aching Pain Frequency: Intermittent Pain Onset: Gradual Patients Stated Pain Goal: 3 Pain Intervention(s): Medication (See eMAR)  Therapy/Group: Individual Therapy   Germain Osgood, M.A. CCC-SLP [phone]  Germain Osgood 01/06/2014, 3:54 PM

## 2014-01-06 NOTE — Progress Notes (Signed)
Physical Therapy Session Note  Patient Details  Name: James Hamilton MRN: [id number] Date of Birth: [birth year]  Today's Date: 01/06/2014 Time: 6222-9798 Time Calculation (min): 27 min  Short Term Goals: Week 3:  PT Short Term Goal 1 (Week 3): =LTG  Skilled Therapeutic Interventions/Progress Updates:    Patient received semi-reclined in bed. Session focused on functional transfers including set up, with emphasis on maintaining NWB precautions and wheelchair mobility. Patient asking for sling to be donned because he states it helps him to remember not to use his R UE. Patient supine>sit from flat bed without use of bedrails and supervision. Patient able to direct/verbalize proper/safe set up for wheelchair for bed>wheelchair transfer without cues from PT. Discussion/educaiton/emphasis on maintaining NWB precautions, especially through R UE during transfer. Patient performs bed>wheelchair transfer via squat pivot with supervision while maintaining NWB precautions for R UE/LE. Wheelchair mobility in L one arm drive X211' with supervision in controlled and home environments. Wheelchair>recliner transfer via squat pivot with supervision while maintaining NWB precautions for R UE/LE. Patient left sitting in recliner with all needs within reach, RN aware.  Therapy Documentation Precautions:  Precautions Precautions: Fall Required Braces or Orthoses: Knee Immobilizer - Right;Sling Knee Immobilizer - Right: On at all times Restrictions Weight Bearing Restrictions: Yes RUE Weight Bearing: Non weight bearing RLE Weight Bearing: Non weight bearing Other Position/Activity Restrictions: R UE NWB as of 12/25/13 Pain: Pain Assessment Pain Assessment: No/denies pain Pain Score: 0-No pain Locomotion : Ambulation Ambulation/Gait Assistance: Not tested (comment) Wheelchair Mobility Distance: 100   See FIM for current functional status  Therapy/Group: Individual Therapy  Lillia Abed.  Audrey Eller, PT, DPT 01/06/2014, 8:58 AM

## 2014-01-06 NOTE — Progress Notes (Signed)
Subjective/Complaints: Right knee sore, but otherwise ok. Slept last night. Participating and engaging more in therapies.  A 12 point review of systems has been performed and if not noted above is otherwise negative.   Objective: Vital Signs: Blood pressure 116/69, pulse 89, temperature 98.5 F (36.9 C), temperature source Oral, resp. rate 18, weight 64.864 kg (143 lb), SpO2 98.00%. No results found.  Recent Labs  01/06/14 0530  WBC 6.1  HGB 9.9*  HCT 29.8*  PLT 414*    Recent Labs  01/05/14 0454 01/06/14 0530  NA 143 142  K 4.1 4.1  CL 103 100  GLUCOSE 104* 88  BUN 7 6  CREATININE 1.01 0.88  CALCIUM 8.7 9.0   CBG (last 3)  No results found for this basename: GLUCAP,  in the last 72 hours  Wt Readings from Last 3 Encounters:  01/06/14 64.864 kg (143 lb)  12/17/13 70.3 kg (154 lb 15.7 oz)  12/17/13 70.3 kg (154 lb 15.7 oz)    Physical Exam:  General: Awake, NAD HEENT: Head is normocephalic, atraumatic, PERRLA, EOMI, sclera anicteric, oral mucosa pink and moist, dentition intact, ext ear canals clear,  Neck: Supple without JVD or lymphadenopathy  Heart: Reg rate and rhythm. No murmurs rubs or gallops  Chest: CTA bilaterally without wheezes, rales, or rhonchi; no distress  Abdomen: Soft, non-tender, non-distended, bowel sounds positive.  Extremities: No clubbing, cyanosis, or edema. Pulses are 2+  Skin: Clean and intact without signs of breakdown. Right leg incisions all intact without drainage. Neuro: more alert, still distracted.   Answers basic questions regarding living situation.   Moves all 4's except for right leg. Senses pain in all 4's. 3-/5 R delt, 4/5 R bi, tri, grip, 5/5 in LUE and LLE, 2- R hip flex, 3/5 ankle Musculoskeletal: right leg in KI with ACE wrap over lower portion of leg.   Psych: Pt's affect is flat, remains distracted. Can be redirected   Assessment/Plan: 1. Functional deficits secondary to TBI, right femur,tibial plateau fx's  which require 3+ hours per day of interdisciplinary therapy in a comprehensive inpatient rehab setting. Physiatrist is providing close team supervision and 24 hour management of active medical problems listed below. Physiatrist and rehab team continue to assess barriers to discharge/monitor patient progress toward functional and medical goals.  Family has backed out and now is pursuing snf  FIM: FIM - Bathing Bathing Steps Patient Completed: Chest;Right Arm;Abdomen;Front perineal area;Buttocks;Right upper leg;Left upper leg Bathing: 4: Min-Patient completes 8-9 12f 10 parts or 75+ percent  FIM - Upper Body Dressing/Undressing Upper body dressing/undressing steps patient completed: Thread/unthread right sleeve of pullover shirt/dresss;Thread/unthread left sleeve of pullover shirt/dress;Put head through opening of pull over shirt/dress;Pull shirt over trunk Upper body dressing/undressing: 4: Min-Patient completed 75 plus % of tasks FIM - Lower Body Dressing/Undressing Lower body dressing/undressing steps patient completed: Pull underwear up/down;Pull pants up/down Lower body dressing/undressing: 1: Total-Patient completed less than 25% of tasks  FIM - Toileting Toileting steps completed by patient: Adjust clothing prior to toileting;Performs perineal hygiene Toileting Assistive Devices: Grab bar or rail for support Toileting: 3: Mod-Patient completed 2 of 3 steps  FIM - Radio producer Devices: Grab bars Toilet Transfers: 4-To toilet/BSC: Min A (steadying Pt. > 75%);4-From toilet/BSC: Min A (steadying Pt. > 75%)  FIM - Bed/Chair Transfer Bed/Chair Transfer Assistive Devices: Arm rests Bed/Chair Transfer: 5: Supine > Sit: Supervision (verbal cues/safety issues);4: Bed > Chair or W/C: Min A (steadying Pt. > 75%)  FIM - Locomotion: Wheelchair Distance: 115 Locomotion: Wheelchair: 0: Activity did not occur FIM - Locomotion: Ambulation Locomotion: Ambulation  Assistive Devices: Administrator Ambulation/Gait Assistance: Not tested (comment) Locomotion: Ambulation: 0: Activity did not occur  Comprehension Comprehension Mode: Auditory Comprehension: 5-Understands complex 90% of the time/Cues < 10% of the time  Expression Expression Mode: Verbal Expression: 5-Expresses complex 90% of the time/cues < 10% of the time  Social Interaction Social Interaction: 5-Interacts appropriately 90% of the time - Needs monitoring or encouragement for participation or interaction.  Problem Solving Problem Solving: 5-Solves basic 90% of the time/requires cueing < 10% of the time  Memory Memory: 5-Recognizes or recalls 90% of the time/requires cueing < 10% of the time  Medical Problem List and Plan:  TBI with skull fracture, left femural neck fracture with compression screw and left femoral shaft fracture with IM nail--NWB LLE.     1. DVT Prophylaxis/Anticoagulation: Mechanical: Sequential compression devices, below knee Bilateral lower extremities  2. Pain Management: Trauma/sickle cell crisis  -pain better, wbc's coming down  -change  Oxycodone to prn.  Increase fentanyl patch to 38mcg.   -off iv morphine  -xrays of back negative  -stopped  IVF. Encourage PO     3. Mood: i'm fine with cymbalta -seroquel is helping sleep 4. Neuropsych: This patient is not capable of making decisions on his own behalf.  5. 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 Hypokalemia: 3.4, continue supplementation 7. Occult intra-articular fracture of the lateral tibial plateau with intercondylar extension and hemarthrosis and probable MCL strain: Continue NWB, reviewed with patient why it's important to wear his KI ---he expressed an understanding and wanted to replace his brace  8. Constipation:  bm's yesterday 9. Right shoulder---proximal, greater tuberosity humerus fx's, sling, no ROM  -continue POC, ortho in agreement. 10. Low back pain. xrays  negative  -lidoderm patches 11. Sickle Cell disease: ensure adequate hydration, pain control 12. Lethargy: neuro and behavioral--generally resolved   -neurologically he's stable  -sodium nl   LOS (Days) 16 A FACE TO FACE EVALUATION WAS PERFORMED  SWARTZ,ZACHARY T 01/06/2014 8:34 AM

## 2014-01-06 NOTE — Progress Notes (Signed)
Occupational Therapy Session Note  Patient Details  Name: James Hamilton MRN: [id number] Date of Birth: 28-Mar-1986  Today's Date: 01/06/2014 Time: 4098-1191 Time Calculation (min): 27 min  Short Term Goals: Week 1:  OT Short Term Goal 1 (Week 1): pt will demonstrate sustain attention for 1 minute to adl task OT Short Term Goal 1 - Progress (Week 1): Progressing toward goal OT Short Term Goal 2 (Week 1): Pt will demonstrate bed mobility HOB flat with min (A) OT Short Term Goal 2 - Progress (Week 1): Progressing toward goal (needs cues for NWB RT UE) OT Short Term Goal 3 (Week 1): Pt will complete toilet transfer Min (A)  OT Short Term Goal 3 - Progress (Week 1): Progressing toward goal OT Short Term Goal 4 (Week 1): pt will complete grooming task min verbal cues and min (A) OT Short Term Goal 4 - Progress (Week 1): Progressing toward goal  Skilled Therapeutic Interventions/Progress Updates:    self care retraining/ toilet transfer: pt complete bathing in recliner with cues for Rt UE. Pt with improved use of LT UE. Pt log rolling Rt and Lt to pull up LB dressing. Pt needed cues to prevent reaching with Rt UE. Pt educated on dressing RT LE first. Pt at sink level for oral care. Pt complete toilet transfer Lt side supervision level. Pt very cooperative this session.  Therapy Documentation Precautions:  Precautions Precautions: Fall Precaution Comments: cognitive deficits Required Braces or Orthoses: Knee Immobilizer - Right;Sling Knee Immobilizer - Right: On at all times Restrictions Weight Bearing Restrictions: Yes RUE Weight Bearing: Non weight bearing RLE Weight Bearing: Non weight bearing Other Position/Activity Restrictions: R UE NWB as of 12/25/13 General:   Vital Signs: Therapy Vitals Temp: 98.1 F (36.7 C) Temp src: Oral Pulse Rate: 80 Resp: 18 BP: 119/64 mmHg Patient Position, if appropriate: Lying Oxygen Therapy SpO2: 99 % O2 Device: None (Room air) Pain: Pain  Assessment Pain Assessment: No/denies pain Pain Score: 7  Pain Type: Acute pain Pain Location: Other (Comment) (thigh) Pain Orientation: Right;Upper;Lateral Pain Descriptors / Indicators: Aching Pain Frequency: Intermittent Pain Onset: Gradual Patients Stated Pain Goal: 3 Pain Intervention(s): Medication (See eMAR) ADL: See FIM for current functional status  Therapy/Group: Individual Therapy  Peri Maris 01/06/2014, 4:09 PM Pager: [phone]

## 2014-01-07 ENCOUNTER — Inpatient Hospital Stay (HOSPITAL_COMMUNITY): Payer: Medicaid Other | Admitting: Speech Pathology

## 2014-01-07 ENCOUNTER — Inpatient Hospital Stay (HOSPITAL_COMMUNITY): Payer: PRIVATE HEALTH INSURANCE | Admitting: Occupational Therapy

## 2014-01-07 ENCOUNTER — Inpatient Hospital Stay (HOSPITAL_COMMUNITY): Payer: Medicaid Other | Admitting: *Deleted

## 2014-01-07 DIAGNOSIS — S06309A Unspecified focal traumatic brain injury with loss of consciousness of unspecified duration, initial encounter: Secondary | ICD-10-CM

## 2014-01-07 DIAGNOSIS — S72009A Fracture of unspecified part of neck of unspecified femur, initial encounter for closed fracture: Secondary | ICD-10-CM

## 2014-01-07 DIAGNOSIS — IMO0002 Reserved for concepts with insufficient information to code with codable children: Secondary | ICD-10-CM

## 2014-01-07 DIAGNOSIS — S020XXA Fracture of vault of skull, initial encounter for closed fracture: Secondary | ICD-10-CM

## 2014-01-07 DIAGNOSIS — S0630AA Unspecified focal traumatic brain injury with loss of consciousness status unknown, initial encounter: Secondary | ICD-10-CM

## 2014-01-07 DIAGNOSIS — S72309A Unspecified fracture of shaft of unspecified femur, initial encounter for closed fracture: Secondary | ICD-10-CM

## 2014-01-07 MED ORDER — OXYCODONE HCL 5 MG PO TABS
10.0000 mg | ORAL_TABLET | ORAL | Status: DC | PRN
  Administered 2014-01-07 – 2014-01-11 (×13): 10 mg via ORAL
  Filled 2014-01-07 (×13): qty 2

## 2014-01-07 NOTE — Progress Notes (Signed)
Subjective/Complaints: Right knee sore, but otherwise ok. RLE pain last night. Participating and engaging more in therapies.  A 12 point review of systems has been performed and if not noted above is otherwise negative.   Objective: Vital Signs: Blood pressure 107/62, pulse 80, temperature 98.5 F (36.9 C), temperature source Oral, resp. rate 18, weight 64.864 kg (143 lb), SpO2 98.00%. No results found.  Recent Labs  01/06/14 0530  WBC 6.1  HGB 9.9*  HCT 29.8*  PLT 414*    Recent Labs  01/05/14 0454 01/06/14 0530  NA 143 142  K 4.1 4.1  CL 103 100  GLUCOSE 104* 88  BUN 7 6  CREATININE 1.01 0.88  CALCIUM 8.7 9.0   CBG (last 3)  No results found for this basename: GLUCAP,  in the last 72 hours  Wt Readings from Last 3 Encounters:  01/06/14 64.864 kg (143 lb)  12/17/13 70.3 kg (154 lb 15.7 oz)  12/17/13 70.3 kg (154 lb 15.7 oz)    Physical Exam:  General: Awake, NAD HEENT: Head is normocephalic, atraumatic, PERRLA, EOMI, sclera anicteric, oral mucosa pink and moist, dentition intact, ext ear canals clear,  Neck: Supple without JVD or lymphadenopathy  Heart: Reg rate and rhythm. No murmurs rubs or gallops  Chest: CTA bilaterally without wheezes, rales, or rhonchi; no distress  Abdomen: Soft, non-tender, non-distended, bowel sounds positive.  Extremities: No clubbing, cyanosis, or edema. Pulses are 2+  Skin: Clean and intact without signs of breakdown. Right leg incisions all intact without drainage. Neuro: more alert, still distracted.   Answers basic questions regarding living situation.   Moves all 4's except for right leg. Senses pain in all 4's. 3-/5 R delt, 4/5 R bi, tri, grip, 5/5 in LUE and LLE, 2- R hip flex, 3/5 ankle Musculoskeletal: right leg in KI with ACE wrap over lower portion of leg.   Psych: Pt's affect is flat, remains distracted. Can be redirected   Assessment/Plan: 1. Functional deficits secondary to TBI, right femur,tibial plateau fx's  which require 3+ hours per day of interdisciplinary therapy in a comprehensive inpatient rehab setting. Physiatrist is providing close team supervision and 24 hour management of active medical problems listed below. Physiatrist and rehab team continue to assess barriers to discharge/monitor patient progress toward functional and medical goals.  Family has backed out and now is pursuing snf  FIM: FIM - Bathing Bathing Steps Patient Completed: Chest;Right Arm;Left Arm;Abdomen;Front perineal area;Buttocks;Right upper leg;Left upper leg;Left lower leg (including foot) Bathing: 5: Set-up assist to: Obtain items  FIM - Upper Body Dressing/Undressing Upper body dressing/undressing steps patient completed: Thread/unthread right sleeve of pullover shirt/dresss;Thread/unthread left sleeve of pullover shirt/dress;Put head through opening of pull over shirt/dress;Pull shirt over trunk Upper body dressing/undressing: 4: Min-Patient completed 75 plus % of tasks FIM - Lower Body Dressing/Undressing Lower body dressing/undressing steps patient completed: Thread/unthread right pants leg;Thread/unthread left pants leg;Pull pants up/down;Thread/unthread right underwear leg;Thread/unthread left underwear leg;Pull underwear up/down;Don/Doff left sock Lower body dressing/undressing: 4: Min-Patient completed 75 plus % of tasks  FIM - Toileting Toileting steps completed by patient: Adjust clothing prior to toileting;Performs perineal hygiene Toileting Assistive Devices: Grab bar or rail for support Toileting: 3: Mod-Patient completed 2 of 3 steps  FIM - Radio producer Devices: Recruitment consultant Transfers: 5-To toilet/BSC: Supervision (verbal cues/safety issues);5-From toilet/BSC: Supervision (verbal cues/safety issues)  FIM - Engineer, site Assistive Devices: Arm rests Bed/Chair Transfer: 5: Supine > Sit: Supervision (verbal cues/safety  issues);5: Bed >  Chair or W/C: Supervision (verbal cues/safety issues);5: Chair or W/C > Bed: Supervision (verbal cues/safety issues)  FIM - Locomotion: Wheelchair Distance: 100 Locomotion: Wheelchair: 2: Travels 59 - 149 ft with supervision, cueing or coaxing FIM - Locomotion: Ambulation Locomotion: Ambulation Assistive Devices: Administrator Ambulation/Gait Assistance: Not tested (comment) Locomotion: Ambulation: 0: Activity did not occur  Comprehension Comprehension Mode: Auditory Comprehension: 5-Follows basic conversation/direction: With no assist  Expression Expression Mode: Verbal Expression: 5-Expresses basic needs/ideas: With no assist  Social Interaction Social Interaction: 5-Interacts appropriately 90% of the time - Needs monitoring or encouragement for participation or interaction.  Problem Solving Problem Solving: 5-Solves basic 90% of the time/requires cueing < 10% of the time  Memory Memory: 5-Recognizes or recalls 90% of the time/requires cueing < 10% of the time  Medical Problem List and Plan:  TBI with skull fracture, left femoral neck fracture 12/16/13 with compression screw and left femoral shaft fracture with IM nail--NWB LLE.     1. DVT Prophylaxis/Anticoagulation: Mechanical: Sequential compression devices, below knee Bilateral lower extremities  2. Pain Management: Trauma/sickle cell crisis  -pain better,  -change  Oxycodone to prn.   fentanyl patch  26mcg. Change oxy IR to Q 4 h  -off iv morphine  -xrays of back negative  -stopped  IVF. Encourage PO     3. Mood: i'm fine with cymbalta -seroquel is helping sleep 4. Neuropsych: This patient is not capable of making decisions on his own behalf.  5. 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 Hypokalemia: 3.4, continue supplementation 7. Occult intra-articular fracture of the lateral tibial plateau with intercondylar extension and hemarthrosis and probable MCL strain: Continue NWB, reviewed with patient  why it's important to wear his KI ---he expressed an understanding and wanted to replace his brace  8. Constipation:  bm's yesterday 9. Right shoulder---proximal, greater tuberosity humerus fx's, sling, no ROM  -continue POC, ortho in agreement. 10. Low back pain. xrays negative  -lidoderm patches 11. Sickle Cell disease: ensure adequate hydration, pain control 12. Lethargy: neuro and behavioral--generally resolved   -neurologically he's stable  -sodium nl   LOS (Days) 17 A FACE TO FACE EVALUATION WAS PERFORMED  KIRSTEINS,ANDREW E 01/07/2014 9:07 AM

## 2014-01-07 NOTE — Progress Notes (Signed)
Speech Language Pathology Daily Session Note  Patient Details  Name: James Hamilton MRN: [id number] Date of Birth: 11-28-1985  Today's Date: 01/07/2014 Time: 1000-1025 Time Calculation (min): 25 min  Short Term Goals: Week 3: SLP Short Term Goal 1 (Week 3): Pt will utilize memory compensatory strategies to increase recall new, daily information with Mod I.  SLP Short Term Goal 2 (Week 3): Pt will demonstrate problem solving for complex tasks with Mod I.  SLP Short Term Goal 3 (Week 3): Pt will direct care in regards to transfers, etc. with Mod I.   Skilled Therapeutic Interventions: Skilled treatment session focused on cognitive goals. SLP facilitated session by providing supervision question cues for intellectual awareness of current medications and their functions and for anticipatory awareness in regards to discharge planning and increased safety with medication administration with use of a pill box. Pt asking appropriate questions in regards to safety and problem solving. Continue with current plan of care.    FIM:  Comprehension Comprehension Mode: Auditory Comprehension: 5-Follows basic conversation/direction: With no assist Expression Expression: 5-Expresses basic needs/ideas: With no assist Social Interaction Social Interaction: 5-Interacts appropriately 90% of the time - Needs monitoring or encouragement for participation or interaction. Problem Solving Problem Solving: 5-Solves basic 90% of the time/requires cueing < 10% of the time Memory Memory: 5-Recognizes or recalls 90% of the time/requires cueing < 10% of the time  Pain No/Denies Pain   Therapy/Group: Individual Therapy  James Hamilton 01/07/2014, 4:06 PM

## 2014-01-07 NOTE — Progress Notes (Signed)
Occupational Therapy Session Note  Patient Details  Name: James Hamilton MRN: [id number] Date of Birth: 08/08/1986  Today's Date: 01/07/2014 Time: 1130-1200 Time Calculation (min): 30 min  Short Term Goals: Week 3:   STG = LTG   Skilled Therapeutic Interventions/Progress Updates:    1:1 self care retraining: pt sitting in recliner on arrival. Pt performing bathing in reclined position to help simulate bed surface. Pt needed mod v/c and min tactile cues for prevention of Rt UE use during session. Pt using Rt UE mainly for bridging for LB dressing. Pt using long handle sponge for lB bathing. Pt directing care throughout session. Pt with good return demo dressing Rt UE using LT UE. Pt demonstrates some incr carry over. Pt in recliner at end of session with RN arriving with medication.  SW- LUCY provided DME needs if d/c is for home  Recommendations: w/c with elevated leg rest, drop arm 3n1 - question need for hospital bed again- would need to ask mother  Therapy Documentation Precautions:  Precautions Precautions: Fall Precaution Comments: cognitive deficits Required Braces or Orthoses: Knee Immobilizer - Right;Sling Knee Immobilizer - Right: On at all times Restrictions Weight Bearing Restrictions: Yes RUE Weight Bearing: Non weight bearing RLE Weight Bearing: Non weight bearing Other Position/Activity Restrictions: R UE NWB as of 12/25/13 General:   Vital Signs:   Pain: Pain Assessment Pain Assessment: 0-10 Pain Score: 7  Pain Type: Acute pain;Surgical pain Pain Location: Leg Pain Orientation: Right Pain Descriptors / Indicators: Aching;Spasm Pain Frequency: Intermittent Pain Onset: On-going Patients Stated Pain Goal: 5 Pain Intervention(s): Medication (See eMAR) (anti-spasmatic given) Multiple Pain Sites: No  See FIM for current functional status  Therapy/Group: Individual Therapy  Peri Maris 01/07/2014, 12:18 PM Pager: [phone]

## 2014-01-07 NOTE — Progress Notes (Signed)
Physical Therapy Session Note  Patient Details  Name: James Hamilton MRN: [id number] Date of Birth: 03/09/1986  Today's Date: 01/07/2014 Time: 7846-9629 Time Calculation (min): 26 min  Short Term Goals: Week 3:  PT Short Term Goal 1 (Week 3): =LTG  Skilled Therapeutic Interventions/Progress Updates:    Patient received supine in bed. Session focused on functional transfers, wheelchair mobility, and direction of care. Supine>sit with mod I, patient directs entire set up of bed>wheelchair transfer and performs with supervision while maintaining NWB precautions R UE/LE. Wheelchair mobility with L one arm drive and supervision 105' x2. Patient returned to room and directs set up for wheelchair>recliner transfer with supervision, continuing to maintain NWB precautions. Patient left sitting in recliner with all needs within reach.  Anticipate patient will be able to d/c home with intermittent supervision. Supervision/set-up assistance will be required for functional transfers, but patient with greatly improved safety, awareness, and decreased impulsivity.  Therapy Documentation Precautions:  Precautions Precautions: Fall Precaution Comments: cognitive deficits Required Braces or Orthoses: Knee Immobilizer - Right;Sling Knee Immobilizer - Right: On at all times Restrictions Weight Bearing Restrictions: Yes RUE Weight Bearing: Non weight bearing RLE Weight Bearing: Non weight bearing Other Position/Activity Restrictions: R UE NWB as of 12/25/13 Pain: Pain Assessment Pain Assessment: 0-10 Pain Score: 5  Pain Type: Surgical pain Pain Location: Leg Pain Orientation: Right Pain Descriptors / Indicators: Aching;Sore Pain Frequency: Intermittent Pain Onset: On-going Patients Stated Pain Goal: 5 Pain Intervention(s): RN made aware;Repositioned;Ambulation/increased activity Multiple Pain Sites: No Locomotion : Ambulation Ambulation/Gait Assistance: Not tested (comment) Wheelchair  Mobility Distance: 105   See FIM for current functional status  Therapy/Group: Individual Therapy  Lillia Abed. Lovell Roe, PT, DPT 01/07/2014, 9:25 AM

## 2014-01-08 ENCOUNTER — Encounter (HOSPITAL_COMMUNITY): Payer: PRIVATE HEALTH INSURANCE | Admitting: Occupational Therapy

## 2014-01-08 ENCOUNTER — Inpatient Hospital Stay (HOSPITAL_COMMUNITY): Payer: Medicaid Other | Admitting: *Deleted

## 2014-01-08 ENCOUNTER — Inpatient Hospital Stay (HOSPITAL_COMMUNITY): Payer: Medicaid Other | Admitting: Speech Pathology

## 2014-01-08 ENCOUNTER — Inpatient Hospital Stay (HOSPITAL_COMMUNITY): Payer: Medicaid Other

## 2014-01-08 NOTE — Progress Notes (Signed)
Speech Language Pathology Daily Session Note  Patient Details  Name: Federick Levene MRN: [id number] Date of Birth: 1986-04-20  Today's Date: 01/08/2014 Time: 1287-8676 Time Calculation (min): 25 min  Short Term Goals: Week 3: SLP Short Term Goal 1 (Week 3): Pt will utilize memory compensatory strategies to increase recall new, daily information with Mod I.  SLP Short Term Goal 2 (Week 3): Pt will demonstrate problem solving for complex tasks with Mod I.  SLP Short Term Goal 3 (Week 3): Pt will direct care in regards to transfers, etc. with Mod I.   Skilled Therapeutic Interventions: Skilled treatment focused on cognitive goals. SLP facilitated session by providing supervision question cues for functional problem solving with organization pill box for medication management task. Pt demonstrated appropriate anticipatory awareness in regards to d/c planning and need for family education with his parents. Continue with current plan of care.    FIM:  Comprehension Comprehension Mode: Auditory Comprehension: 6-Follows complex conversation/direction: With extra time/assistive device Expression Expression Mode: Verbal Expression: 6-Expresses complex ideas: With extra time/assistive device Social Interaction Social Interaction: 5-Interacts appropriately 90% of the time - Needs monitoring or encouragement for participation or interaction. Problem Solving Problem Solving: 6-Solves complex problems: With extra time Memory Memory: 5-Requires cues to use assistive device  Pain Pain Assessment Pain Assessment: No/denies pain  Therapy/Group: Individual Therapy  Faelynn Wynder, [place] 01/08/2014, 1:41 PM

## 2014-01-08 NOTE — Progress Notes (Signed)
Social Work Patient ID: James Hamilton, male   DOB: [birth year], 28 y.o.   MRN: [id number]   After much discussion with therapies, mother and pt, all are agreed that pt has made significant progress and reaching an intermittent supervision level.  All agreed with change in plan away from SNF and back to home with mother where intermittent supervision can be provided.  Will arrange follow up therapy and DME with plan to d/c on Monday 01/11/14.  Emonii Wienke, LCSW

## 2014-01-08 NOTE — Progress Notes (Signed)
Physical Therapy Session Note  Patient Details  Name: James Hamilton MRN: [id number] Date of Birth: Mar 17, 1986  Today's Date: 01/08/2014 Time: 1300-1330 Time Calculation (min): 30 min  Short Term Goals: Week 3:  PT Short Term Goal 1 (Week 3): =LTG  Skilled Therapeutic Interventions/Progress Updates:    Patient received sitting in recliner. Session focused on functional transfers, wheelchair mobility, and direction of care. Patient directs entire set up of recliner>wheelchair transfer and performs with supervision while maintaining NWB precautions R UE/LE. Wheelchair mobility with L one arm drive and supervision 150'. In therapy gym, patient participated in Wii bowling to improve activity tolerance while seated in wheelchair with emphasis on upright posture. Patient returned to room and directs set up for wheelchair>recliner transfer with supervision, continuing to maintain NWB precautions. Patient left sitting in recliner with all needs within reach.  Therapy Documentation Precautions:  Precautions Precautions: Fall Precaution Comments: cognitive deficits Required Braces or Orthoses: Knee Immobilizer - Right;Sling Knee Immobilizer - Right: On at all times Restrictions Weight Bearing Restrictions: Yes RUE Weight Bearing: Non weight bearing RLE Weight Bearing: Non weight bearing Other Position/Activity Restrictions: R UE NWB as of 12/25/13 Pain: Pain Assessment Pain Assessment: 0-10 Pain Score: 4  Pain Type: Surgical pain Pain Location: Leg Pain Orientation: Right Pain Descriptors / Indicators: Aching;Sore Pain Onset: On-going Pain Intervention(s): RN made aware;Repositioned;Ambulation/increased activity Multiple Pain Sites: No Locomotion : Ambulation Ambulation/Gait Assistance: Not tested (comment) Wheelchair Mobility Distance: 150   See FIM for current functional status  Therapy/Group: Individual Therapy  Lillia Abed. Marny Smethers, PT, DPT 01/08/2014, 3:18 PM

## 2014-01-08 NOTE — Progress Notes (Signed)
Occupational Therapy Session Note  Patient Details  Name: James Hamilton MRN: [id number] Date of Birth: 1986-02-02  Today's Date: 01/08/2014 Time: 0930-1015 Time Calculation (min): 45 min  Short Term Goals: STG = LTG     Skilled Therapeutic Interventions/Progress Updates:    1:1 self care retraining and maintain non weight bearing RT UE: Pt educated the need to simulate home environment. Pt scooting toward HOB to rest against head board for UB support. Pt using RT UE with max cueing to avoid. Pt with setup basin on LT side. Pt performed bathing  With min cues for RT UE. Pt doff shorts/ boxers supervision level. Pt log rolling for peri care. Pt using reacher to don underwear and shorts with therapist holding RT UE to prevent weight bearing. Pt don shirt Rt UE first then neck then LT UE supervision level. Pt min (A) to don sling RT UE Pt transferring to recliner supervision level. Pt voiding bladder with urinal supervision level.   Therapy Documentation Precautions:  Precautions Precautions: Fall Precaution Comments: cognitive deficits Required Braces or Orthoses: Knee Immobilizer - Right;Sling Knee Immobilizer - Right: On at all times Restrictions Weight Bearing Restrictions: Yes RUE Weight Bearing: Non weight bearing RLE Weight Bearing: Non weight bearing Other Position/Activity Restrictions: R UE NWB as of 12/25/13 General:   Vital Signs: Therapy Vitals Temp: 97.9 F (36.6 C) Temp src: Oral Pulse Rate: 92 Resp: 18 BP: 114/70 mmHg Patient Position, if appropriate: Sitting Oxygen Therapy SpO2: 99 % O2 Device: None (Room air) Pain: Pain Assessment Pain Assessment: 0-10 Pain Score: 4  Pain Type: Surgical pain Pain Location: Leg Pain Orientation: Right Pain Descriptors / Indicators: Aching;Sore Pain Onset: On-going Pain Intervention(s): RN made aware;Repositioned;Ambulation/increased activity Multiple Pain Sites: No     See FIM for current functional  status  Therapy/Group: Individual Therapy  Peri Maris 01/08/2014, 3:30 PM Pager: [phone]
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
## 2014-01-09 ENCOUNTER — Inpatient Hospital Stay (HOSPITAL_COMMUNITY): Payer: Medicaid Other | Admitting: *Deleted

## 2014-01-09 DIAGNOSIS — K219 Gastro-esophageal reflux disease without esophagitis: Secondary | ICD-10-CM

## 2014-01-09 DIAGNOSIS — S069X9A Unspecified intracranial injury with loss of consciousness of unspecified duration, initial encounter: Secondary | ICD-10-CM

## 2014-01-09 DIAGNOSIS — D62 Acute posthemorrhagic anemia: Secondary | ICD-10-CM

## 2014-01-09 DIAGNOSIS — S069XAA Unspecified intracranial injury with loss of consciousness status unknown, initial encounter: Secondary | ICD-10-CM
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
## 2014-01-09 NOTE — Progress Notes (Signed)
Patient came back from xray with knee immobolizer not on R knee. Educated patient about knee immobilizer and the need for it to stay on to restrict knee ROM. Patient agreed and allowed RN to place knee immobilzer on. Few hours later during hourly rounds, knee immobilizer was open while patient was in bed. Reinforced education, but still kept the immobilizer open. RN will relay information to med team in the AM.

## 2014-01-09 NOTE — Progress Notes (Signed)
Physical Therapy Session Note  Patient Details  Name: James Hamilton MRN: [id number] Date of Birth: 1986/07/19  Today's Date: 01/09/2014 Time: 4097-3532 Time Calculation (min): 40 min  Short Term Goals: Week 1:  PT Short Term Goal 1 (Week 1): STGs=LTGs secondary to LOS Week 2:  PT Short Term Goal 1 (Week 2): =LTG Week 3:  PT Short Term Goal 1 (Week 3): =LTG  Skilled Therapeutic Interventions/Progress Updates:  Tx focused on directing self care, basic and car transfers, WC in controlled and community/outdoors, and gentle knee ROM. Surgeon arrived at start of session, updating pt on status, verbal OK to discontinue KI and allow gentle knee ROM only. Still no AROM at hip beyond functional movement and no RLE or RUE WB. Pt performed all mobility today within precautions and provided cues for RLE alignment in resting positions.  Supine>sit and BED>WC transfer with S, providing cues for self-directing care.   Pt propelled one-arm drive WC D>924' in controlled setting with S for tight turns and X200' with min A in community setting outdoors due to difficulty on inclines. Pt able to pathfind back to unit without cues.  Demonstrated car transfer, pt completed with close S and cues for sequence.  Provided gentle PROM for R knee in sitting, as well as 5 LAQ in comfortable range.  Pt left up in [place] without KI, father present.      Therapy Documentation Precautions:  Precautions Precautions: Fall Precaution Comments: cognitive deficits Required Braces or Orthoses: Knee Immobilizer - Right;Sling Knee Immobilizer - Right: On at all times Restrictions Weight Bearing Restrictions: Yes RUE Weight Bearing: Non weight bearing RLE Weight Bearing: Non weight bearing Other Position/Activity Restrictions: R UE NWB as of 12/25/13 General:   Vital Signs: Therapy Vitals Temp: 97.9 F (36.6 C) Temp src: Oral Pulse Rate: 83 Resp: 18 BP: 107/69 mmHg Patient Position, if appropriate: Lying Oxygen  Therapy SpO2: 98 % O2 Device: None (Room air) Pain:   Mobility:   Locomotion : Wheelchair Mobility Distance: 200   See FIM for current functional status  Therapy/Group: Individual Therapy Kennieth Rad, PT, DPT   Kennieth Rad M 01/09/2014, 9:53 AM
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
## 2014-01-10 DIAGNOSIS — E860 Dehydration: Secondary | ICD-10-CM

## 2014-01-10 DIAGNOSIS — R112 Nausea with vomiting, unspecified: Secondary | ICD-10-CM

## 2014-01-10 DIAGNOSIS — S72009A Fracture of unspecified part of neck of unspecified femur, initial encounter for closed fracture: Secondary | ICD-10-CM
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
## 2014-01-11 ENCOUNTER — Inpatient Hospital Stay (HOSPITAL_COMMUNITY): Payer: PRIVATE HEALTH INSURANCE | Admitting: Speech Pathology

## 2014-01-11 ENCOUNTER — Inpatient Hospital Stay (HOSPITAL_COMMUNITY): Payer: Medicaid Other | Admitting: Occupational Therapy

## 2014-01-11 ENCOUNTER — Inpatient Hospital Stay (HOSPITAL_COMMUNITY): Payer: Medicaid Other | Admitting: *Deleted

## 2014-01-11 ENCOUNTER — Telehealth: Payer: Self-pay | Admitting: General Practice

## 2014-01-11 DIAGNOSIS — S06309A Unspecified focal traumatic brain injury with loss of consciousness of unspecified duration, initial encounter: Secondary | ICD-10-CM

## 2014-01-11 DIAGNOSIS — IMO0002 Reserved for concepts with insufficient information to code with codable children: Secondary | ICD-10-CM

## 2014-01-11 DIAGNOSIS — S72309A Unspecified fracture of shaft of unspecified femur, initial encounter for closed fracture: Secondary | ICD-10-CM

## 2014-01-11 DIAGNOSIS — S020XXA Fracture of vault of skull, initial encounter for closed fracture: Secondary | ICD-10-CM

## 2014-01-11 DIAGNOSIS — S72009A Fracture of unspecified part of neck of unspecified femur, initial encounter for closed fracture: Secondary | ICD-10-CM

## 2014-01-11 MED ORDER — SENNOSIDES-DOCUSATE SODIUM 8.6-50 MG PO TABS: 3.0000 | ORAL_TABLET | Freq: Two times a day (BID) | ORAL | Status: DC

## 2014-01-11 MED ORDER — POTASSIUM CHLORIDE CRYS ER 20 MEQ PO TBCR: 20.0000 meq | EXTENDED_RELEASE_TABLET | Freq: Every day | ORAL | Status: DC

## 2014-01-11 MED ORDER — FERROUS SULFATE 325 (65 FE) MG PO TABS: 325.0000 mg | ORAL_TABLET | Freq: Two times a day (BID) | ORAL | Status: DC

## 2014-01-11 MED ORDER — QUETIAPINE FUMARATE 50 MG PO TABS: 50.0000 mg | ORAL_TABLET | Freq: Every day | ORAL | Status: DC

## 2014-01-11 MED ORDER — FENTANYL 25 MCG/HR TD PT72: 25.0000 ug | MEDICATED_PATCH | TRANSDERMAL | Status: DC

## 2014-01-11 MED ORDER — FOLIC ACID 1 MG PO TABS: 1.0000 mg | ORAL_TABLET | Freq: Every day | ORAL | Status: DC

## 2014-01-11 MED ORDER — OXYCODONE HCL 10 MG PO TABS: 5.0000 mg | ORAL_TABLET | Freq: Four times a day (QID) | ORAL | Status: DC | PRN

## 2014-01-11 MED ORDER — PANTOPRAZOLE SODIUM 40 MG PO TBEC: 40.0000 mg | DELAYED_RELEASE_TABLET | Freq: Every day | ORAL | Status: DC

## 2014-01-11 MED ORDER — DULOXETINE HCL 20 MG PO CPEP: 20.0000 mg | ORAL_CAPSULE | Freq: Every day | ORAL | Status: DC
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
## 2014-01-11 NOTE — Progress Notes (Signed)
Occupational Therapy Session Note  Patient Details  Name: James Hamilton MRN: [id number] Date of Birth: 03-Apr-1986  Today's Date: 01/11/2014 Time: 0962-8366 Time Calculation (min): 35 min  Short Term Goals: Week 2:  OT Short Term Goal 1 (Week 2): WEEK 1 GOALS REMAIN APPROPRIATE  Skilled Therapeutic Interventions/Progress Updates:  Patient resting in bed upon arrival with mother at his side then dad arrived however, stepped out during remainder of self care session.  Focused session on patient and caregiver education related to recent MD orders for RUE Pendulum exercises, sponge bath, dress, and toilet transfers as well as toileting.  Mother observed and provided cues for patient not to bear weight into RUE and not to move right shoulder during BADL.  Encouraged patient to bath and dress UB then don RUE sling to limit opportunities of accidentally bearing weight into RUE or accidentally actively moving right shoulder.  Patient declines right shoulder pain when he accidentally bears weight or actively moves his right shoulder that is why he continues to require cues to adhere to these precautions.  Patient required monitoring of all precautions during toilet transfers and toileting on drop arm commode.  Hand out for right shoulder Pendulum Exercises provided, demonstration to perform while seated EOB or edge of w/c.  Time not alloted for patient to return demonstrate therefore, patient will have 1-2 sessions of HHPT to assure home safety and for patient to return demonstrate pendulum exercises.  Patient and family with no further questions for this OT and report feeling ready for discharge home today.  Therapy Documentation Precautions:  Precautions Precautions: Fall Precaution Comments: cognitive deficits Required Braces or Orthoses: Knee Immobilizer - Right;Sling (sling on at all times when OOB) Knee Immobilizer - Right: Other (comment) (donned for comfort only, per MD  orders) Restrictions Weight Bearing Restrictions: Yes RUE Weight Bearing: Non weight bearing RLE Weight Bearing: Non weight bearing Other Position/Activity Restrictions: R UE NWB as of 12/25/13; per MD orders 01/09/14, patient now allowed gentle ROM R LE and pendulum exercises R UE Pain: 5/10 RLE, premedicated per patient, repositioned and rest. ADL: See FIM for current functional status  Therapy/Group: Individual Therapy  Delia Sitar 01/11/2014, 11:58 AM

## 2014-01-11 NOTE — Discharge Instructions (Signed)
Inpatient Rehab Discharge Instructions  James Hamilton Discharge date and time:  01/11/14  Activities/Precautions/ Functional Status: Activity: activity as tolerated. Wear Knee immoblizer as needed for comfort.  Diet: regular diet Wound Care:   Functional status:  ___ No restrictions     ___ Walk up steps independently ___ 24/7 supervision/assistance   ___ Walk up steps with assistance _X__ Intermittent supervision/assistance  ___ Bathe/dress independently ___ Walk with walker     ___ Bathe/dress with assistance ___ Walk Independently    ___ Shower independently _X__ Walk with supervision.     ___ Shower with assistance _X__ No alcohol     ___ Return to work/school ________    COMMUNITY REFERRALS UPON DISCHARGE:    Home Health:   PT                      Agency: [facility] Phone: [phone]   Medical Equipment/Items Ordered: wheelchair and cushion, drop arm commode and hospital bed                                                     Agency/Supplier: [facility] @ [phone]  Other: [facility] - "New Patient" packet to be sent to home.  Need to complete and return and they will then schedule an appointment.    GENERAL COMMUNITY RESOURCES FOR PATIENT/FAMILY:  Support Groups: via [employer]        Special Instructions: 1. Keep R- arm in sling and no weight on right arm. 2. No weight on Right Leg.     My questions have been answered and I understand these instructions. I will adhere to these goals and the provided educational materials after my discharge from the hospital.  Patient/Caregiver Signature _______________________________ Date __________  Clinician Signature _______________________________________ Date __________  Please bring this form and your medication list with you to all your follow-up doctor's appointments.

## 2014-01-11 NOTE — Progress Notes (Signed)
Physical Therapy Discharge Summary  Patient Details  Name: James Hamilton MRN: [id number] Date of Birth: 03-31-1986  Today's Date: 01/11/2014 Time: 1000-1020 Time Calculation (min): 20 min  Patient has met 9 of 9 long term goals due to improved activity tolerance, improved balance, improved postural control, increased strength, increased range of motion, decreased pain, ability to compensate for deficits, functional use of  right upper extremity and right lower extremity, improved attention, improved awareness and improved coordination.  Patient to discharge at a wheelchair level Supervision.   Patient's parents are independent to provide the necessary set up assistance assistance at discharge. Additionally, patient is independent in direction of care/set-up of wheelchair for transfers. Patient's parents have participated in two different family education sessions to address proper safety and set up for transfers. Parents return demonstrate proper safety, set up, and wheelchair parts management.  Reasons goals not met: N/A, all LTGs met.  Recommendation:  Patient will benefit from ongoing skilled PT services in home health setting to continue to advance safe functional mobility, address ongoing impairments in strength, balance, coordination, overall functional mobility, and minimize fall risk.  Equipment: L one arm drive wheelchair and cushion  Reasons for discharge: treatment goals met and discharge from hospital  Patient/family agrees with progress made and goals achieved: Yes  PT Discharge Precautions/Restrictions Precautions Precautions: Fall Required Braces or Orthoses: Knee Immobilizer - Right;Sling (sling on at all times when OOB) Knee Immobilizer - Right: Other (comment) (donned for comfort only, per MD orders) Restrictions Weight Bearing Restrictions: Yes RUE Weight Bearing: Non weight bearing RLE Weight Bearing: Non weight bearing Other Position/Activity Restrictions: R UE  NWB as of 12/25/13; per MD orders 01/09/14, patient now allowed gentle ROM R LE and pendulum exercises R UE Pain Pain Assessment Pain Assessment: 0-10 Pain Score: 2  Pain Type: Surgical pain Pain Location: Leg Pain Orientation: Right Pain Descriptors / Indicators: Aching;Sore Pain Onset: On-going Pain Intervention(s): RN made aware;Repositioned;Ambulation/increased activity Multiple Pain Sites: No Vision/Perception  Vision - History Baseline Vision: No visual deficits Perception Perception: Within Functional Limits Praxis Praxis: Intact  Cognition Overall Cognitive Status: Impaired/Different from baseline Arousal/Alertness: Awake/alert Orientation Level: Oriented X4 Focused Attention: Appears intact Sustained Attention: Appears intact Memory: Impaired Memory Impairment: Decreased recall of new information;Decreased short term memory Awareness: Impaired Awareness Impairment: Emergent impairment;Anticipatory impairment Initiating: Impaired Initiating Impairment: Verbal basic;Functional basic Safety/Judgment: Appears intact (in controlled environment) [place]o [employer] Scales of Cognitive Functioning: Automatic/appropriate Sensation Sensation Light Touch: Appears Intact Proprioception: Appears Intact Coordination Gross Motor Movements are Fluid and Coordinated: Yes Fine Motor Movements are Fluid and Coordinated: Yes Motor  Motor Motor: Within Functional Limits Motor - Discharge Observations: improved strength and postural control  Mobility Bed Mobility Bed Mobility: Supine to Sit;Sitting - Scoot to Edge of Bed;Sit to Supine Supine to Sit: 6: Modified independent (Device/Increase time);HOB flat Sitting - Scoot to Edge of Bed: 6: Modified independent (Device/Increase time) Sit to Supine: 6: Modified independent (Device/Increase time);HOB flat Transfers Transfers: Yes Squat Pivot Transfers: 5: Supervision;From elevated surface;With upper extremity assistance;With  armrests Squat Pivot Transfer Details: Verbal cues for precautions/safety Squat Pivot Transfer Details (indicate cue type and reason): Requires set up assistance, patient able to direct care/set-up with independence Locomotion  Ambulation Ambulation: No Ambulation/Gait Assistance: Not tested (comment) (secondary to NWB status of R LE/UE) Gait Gait: No (secondary to NWB R LE/UE) Stairs / Additional Locomotion Stairs: No (secondary to NWB R LE/UE) Wheelchair Mobility Wheelchair Mobility: Yes Wheelchair Assistance: 5: Supervision Wheelchair Assistance Details: Verbal cues for  precautions/safety Wheelchair Propulsion: Left upper extremity (in L one arm drive wheelchair) Wheelchair Parts Management: Independent (independent in direction of w/c parts management; requires assist for physical management secondary to R UE ROM and WB limitations) Distance: 200  Trunk/Postural Assessment  Cervical Assessment Cervical Assessment: Within Functional Limits Thoracic Assessment Thoracic Assessment: Within Functional Limits Lumbar Assessment Lumbar Assessment: Within Functional Limits Postural Control Postural Control: Deficits on evaluation Righting Reactions: delayed Protective Responses: delayed Postural Limitations: improved upright posture in unsupported sitting  Balance Balance Balance Assessed: Yes Static Sitting Balance Static Sitting - Balance Support: Left upper extremity supported;No upper extremity supported;Feet supported Static Sitting - Level of Assistance: 6: Modified independent (Device/Increase time) Dynamic Sitting Balance Dynamic Sitting - Balance Support: Left upper extremity supported;No upper extremity supported;Feet supported;During functional activity Dynamic Sitting - Level of Assistance: 6: Modified independent (Device/Increase time) Dynamic Sitting - Balance Activities: Lateral lean/weight shifting;Forward lean/weight shifting Extremity Assessment  RLE  Assessment RLE Assessment: Exceptions to [facility] RLE Strength RLE Overall Strength: Deficits;Due to precautions;Due to pain RLE Overall Strength Comments: Unable to formally MMT secondary to pain and precautions; functionally 3/5 LLE Assessment LLE Assessment: Exceptions to [facility] LLE Strength LLE Overall Strength: Deficits LLE Overall Strength Comments: 3+ to 4-/5 grossly  See FIM for current functional status  Kewanda Poland S Andriana Casa S. Damita Eppard, PT, DPT 01/11/2014, 12:15 PM

## 2014-01-11 NOTE — Progress Notes (Signed)
Speech Language Pathology Session Note & Discharge Summary  Patient Details  Name: James Hamilton MRN: [id number] Date of Birth: 1986-06-05  Today's Date: 01/11/2014 Time: 9728-2060 Time Calculation (min): 25 min  Skilled Therapeutic Interventions:  Treatment session focused on patient/family education in regards to pt's current cognitive-linguistic function and strategies to utilize at home to increase working memory and overall safety in regards to medication management. The pt and his parents verbalized understanding and handouts were also given to reinforce information. All questions were also answered. Pt will discharge home today with family.   Patient has met 6 of 6 long term goals.  Patient to discharge at overall Supervision level.   Reasons goals not met: N/A   Clinical Impression/Discharge Summary: Pt has made functional gains and has met 6 of 6 LTG's this admission due to improved attention, problem solving, awareness, working memory, speech intelligibility and swallowing function.  Currently, pt is demonstrating behaviors consistent with a Rancho Level VII-VIII and requires overall supervision cues for cognitive tasks that are mildly complex. Pt is also consuming regular textures with thin liquids without overt s/s of aspiration and is Mod I for utilization of swallowing compensatory strategies.  Pt/family education complete and pt will discharge home with supervision from family. Pt would benefit from f/u SLP services to maximize cognitive function and his overall functional independence in order to reduce caregiver burden.   Care Partner:  Caregiver Able to Provide Assistance: Yes  Type of Caregiver Assistance: Physical;Cognitive  Recommendation:  Outpatient SLP;24 hour supervision/assistance  Rationale for SLP Follow Up: Maximize cognitive function and independence;Reduce caregiver burden   Equipment: N/A   Reasons for discharge: Treatment goals met;Discharged from hospital    Patient/Family Agrees with Progress Made and Goals Achieved: Yes   See FIM for current functional status  James Hamilton 01/11/2014, 6:25 PM

## 2014-01-11 NOTE — Telephone Encounter (Signed)
Lennart Pall, SW called to schedule hospital follow up for patient. Patient not established at practice. Mailed new patient paperwork.
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
## 2014-01-11 NOTE — Progress Notes (Signed)
Subjective/Complaints: No new issues. Excited to go home. Still hasn't received wheelchair.  A 12 point review of systems has been performed and if not noted above is otherwise negative.   Objective: Vital Signs: Blood pressure 104/67, pulse 106, temperature 98.1 F (36.7 C), temperature source Oral, resp. rate 18, weight 64.864 kg (143 lb), SpO2 100.00%. No results found. No results found for this basename: WBC, HGB, HCT, PLT,  in the last 72 hours No results found for this basename: NA, K, CL, CO, GLUCOSE, BUN, CREATININE, CALCIUM,  in the last 72 hours CBG (last 3)  No results found for this basename: GLUCAP,  in the last 72 hours  Wt Readings from Last 3 Encounters:  01/06/14 64.864 kg (143 lb)  12/17/13 70.3 kg (154 lb 15.7 oz)  12/17/13 70.3 kg (154 lb 15.7 oz)    Physical Exam:  General: Awake, NAD HEENT: Head is normocephalic, atraumatic, PERRLA, EOMI, sclera anicteric, oral mucosa pink and moist, dentition intact, ext ear canals clear,  Neck: Supple without JVD or lymphadenopathy  Heart: Reg rate and rhythm. No murmurs rubs or gallops  Chest: CTA bilaterally without wheezes, rales, or rhonchi; no distress  Abdomen: Soft, non-tender, non-distended, bowel sounds positive.  Extremities: No clubbing, cyanosis, or edema. Pulses are 2+  Skin: Clean and intact without signs of breakdown. Right leg incisions all intact without drainage. Neuro: more alert, still distracted.   Answers basic questions regarding living situation.   Moves all 4's except for right leg. Senses pain in all 4's. 3-/5 R delt, 4/5 R bi, tri, grip, 5/5 in LUE and LLE, 2- R hip flex, 3/5 ankle Musculoskeletal: right leg in KI with ACE wrap over lower portion of leg.   Psych: Pt's affect is flat, remains distracted. Can be redirected   Assessment/Plan: 1. Functional deficits secondary to TBI, right femur,tibial plateau fx's which require 3+ hours per day of interdisciplinary therapy in a comprehensive  inpatient rehab setting. Physiatrist is providing close team supervision and 24 hour management of active medical problems listed below. Physiatrist and rehab team continue to assess barriers to discharge/monitor patient progress toward functional and medical goals.  Home today. See me in a month  FIM: FIM - Bathing Bathing Steps Patient Completed: Chest;Right Arm;Left Arm;Abdomen;Front perineal area;Buttocks;Right upper leg;Left upper leg;Right lower leg (including foot);Left lower leg (including foot) (uses Long handle sponge to complete Feet RT LE ) Bathing: 5: Supervision: Safety issues/verbal cues (cues for RT and cues for RT LE prevent knee flexion)  FIM - Upper Body Dressing/Undressing Upper body dressing/undressing steps patient completed: Thread/unthread right sleeve of pullover shirt/dresss;Thread/unthread left sleeve of pullover shirt/dress;Put head through opening of pull over shirt/dress;Pull shirt over trunk Upper body dressing/undressing: 5: Supervision: Safety issues/verbal cues (min verbal cues) FIM - Lower Body Dressing/Undressing Lower body dressing/undressing steps patient completed: Thread/unthread right pants leg;Thread/unthread left pants leg;Pull pants up/down;Thread/unthread right underwear leg;Thread/unthread left underwear leg;Pull underwear up/down;Don/Doff left sock Lower body dressing/undressing: 4: Min-Patient completed 75 plus % of tasks  FIM - Toileting Toileting steps completed by patient: Adjust clothing prior to toileting;Performs perineal hygiene Toileting Assistive Devices: Grab bar or rail for support Toileting: 3: Mod-Patient completed 2 of 3 steps  FIM - Radio producer Devices: Recruitment consultant Transfers: 5-To toilet/BSC: Supervision (verbal cues/safety issues);5-From toilet/BSC: Supervision (verbal cues/safety issues)  FIM - Engineer, site Assistive Devices: Arm rests Bed/Chair Transfer:  6: Supine > Sit: No assist;5: Bed > Chair or W/C:  Supervision (verbal cues/safety issues)  FIM - Locomotion: Wheelchair Distance: 200 Locomotion: Wheelchair: 4: Travels 150 ft or more: maneuvers on rugs and over door sillls with minimal assistance (Pt.>75%) (Min A needed on inclines outdoors) FIM - Locomotion: Ambulation Locomotion: Ambulation Assistive Devices: Administrator Ambulation/Gait Assistance: Not tested (comment) Locomotion: Ambulation: 0: Activity did not occur  Comprehension Comprehension Mode: Auditory Comprehension: 5-Understands complex 90% of the time/Cues < 10% of the time  Expression Expression Mode: Verbal Expression: 6-Expresses complex ideas: With extra time/assistive device  Social Interaction Social Interaction: 5-Interacts appropriately 90% of the time - Needs monitoring or encouragement for participation or interaction.  Problem Solving Problem Solving: 5-Solves complex 90% of the time/cues < 10% of the time  Memory Memory: 5-Recognizes or recalls 90% of the time/requires cueing < 10% of the time  Medical Problem List and Plan:  TBI with skull fracture, left femoral neck fracture 12/16/13 with compression screw and left femoral shaft fracture with IM nail--NWB LLE.     1. DVT Prophylaxis/Anticoagulation: Mechanical: Sequential compression devices, below knee Bilateral lower extremities  2. Pain Management: Trauma/sickle cell crisis  -pain better,  -oxy IR prn, fentanyl patch     3. Mood: i'm fine with cymbalta -seroquel is helping sleep 4. Neuropsych: This patient is not capable of making decisions on his own behalf.  5. Acute on chronic anemia/ H/o SS anemia :  1 6. Hypokalemia: 3.4, continue supplementation 7. Occult intra-articular fracture of the lateral tibial plateau with intercondylar extension and hemarthrosis and probable MCL strain: Continue NWB, reviewed with patient why it's important to wear his KI ---he expressed an understanding and  wanted to replace his brace  8. Constipation:  bm's yesterday 9. Right shoulder---proximal, greater tuberosity humerus fx's, sling, no ROM  -continue POC, ortho in agreement. 10. Low back pain. xrays negative  -lidoderm patches--dc 11. Sickle Cell disease: ensure adequate hydration, pain control 12. Lethargy: neuro and behavioral--generally resolved   -neurologically he's stable  -sodium nl   LOS (Days) 21 A FACE TO FACE EVALUATION WAS PERFORMED  Umi Mainor T 01/11/2014 8:26 AM

## 2014-01-11 NOTE — Progress Notes (Signed)
Pt. Got d/C orders and instructions,as well as equipment.Pt. Ready to go home with his family.

## 2014-01-12 ENCOUNTER — Encounter (HOSPITAL_COMMUNITY): Payer: Self-pay | Admitting: Orthopedic Surgery

## 2014-01-12 NOTE — Progress Notes (Signed)
Social Work  Discharge Note  The overall goal for the admission was met for:   Discharge location: Yes - home with mother and others sharing in intermittent assistance  Length of Stay: No - LOS extended due to changing d/c plans (home - SNF - home) with LOS = 21 days  Discharge activity level: Yes - intermittent assist w/c and gait  Home/community participation: Yes  Services provided included: MD, RD, PT, OT, SLP, RN, TR, Pharmacy, [place]: Medicaid (Sickle Cell [place])  Follow-up services arranged: Home Health: PT via [facility], DME: 16x16 lightweight w/c with one arm drive (left) and ELRs, cushion, semi electric hospital bed, drop arm commode all via [place], Other: Pt referred as [facility] to [facility] and Patient/Family has no preference for HH/DME agencies  Comments (or additional information):  Patient/Family verbalized understanding of follow-up arrangements: Yes  Individual responsible for coordination of the follow-up plan: patient  Confirmed correct DME delivered: Reanna Scoggin 01/12/2014    Donzella Carrol

## 2014-01-12 NOTE — Progress Notes (Signed)
Occupational Therapy Discharge Summary  Patient Details  Name: James Hamilton MRN: [id number] Date of Birth: 11-Dec-1985  Today's Date: 01/12/2014 Time:  -     Patient has met 8 of 8 long term goals due to improved activity tolerance, improved balance and improved awareness.  Patient to discharge at overall Supervision level.  Patient's care partner is independent to provide the necessary physical assistance at discharge.    All education is complete and patient indicates understanding. Family education complete. No further questions. New order 2/28 for Pendulum exercises RT UE.   Recommendation:  Recommend follow up for outpatient with RT UE when appropriate  Equipment: w/c with elevated leg rest, drop arm 3n1, long handle sponge  Reasons for discharge: treatment goals met and discharge from hospital  Patient/family agrees with progress made and goals achieved: Yes  OT Discharge    ADL ADL Equipment Provided: Long-handled shoe horn Eating: Supervision/safety Where Assessed-Eating: Chair Grooming: Supervision/safety;Minimal cueing Where Assessed-Grooming: Sitting at sink Upper Body Bathing: Supervision/safety;Minimal cueing (min cueing) Where Assessed-Upper Body Bathing: Bed level Lower Body Bathing: Supervision/safety;Minimal cueing Where Assessed-Lower Body Bathing: Bed level Upper Body Dressing: Supervision/safety;Minimal cueing Where Assessed-Upper Body Dressing: Bed level Lower Body Dressing: Supervision/safety;Moderate cueing Where Assessed-Lower Body Dressing: Bed level Toileting: Supervision/safety Where Assessed-Toileting: Bedside Commode Toilet Transfer: Close supervision Toilet Transfer Method: Squat pivot Toilet Transfer Equipment: Drop arm bedside commode ADL Comments: Pt progressing well with therapy and needs cues for RT UE . Pt attempting to use RT UE at times during sessin due to being Rt handed. Vision/Perception  Vision - History Baseline Vision:  No visual deficits  Cognition Overall Cognitive Status: Impaired/Different from baseline Arousal/Alertness: Awake/alert Focused Attention: Appears intact Sustained Attention: Appears intact Memory: Impaired Memory Impairment: Decreased recall of new information;Decreased short term memory Decreased Short Term Memory: Verbal basic;Functional basic Awareness: Impaired Awareness Impairment: Anticipatory impairment Problem Solving: Impaired Problem Solving Impairment: Functional complex Reasoning: Impaired Reasoning Impairment: Functional complex Decision Making: Impaired Decision Making Impairment: Functional complex Safety/Judgment: Appears intact [employer] Scales of Cognitive Functioning: Automatic/appropriate Sensation Sensation Light Touch: Appears Intact Hot/Cold: Appears Intact Coordination Gross Motor Movements are Fluid and Coordinated: Yes Fine Motor Movements are Fluid and Coordinated: Yes Motor    Mobility  Bed Mobility Bed Mobility: Supine to Sit;Sitting - Scoot to Edge of Bed;Sit to Supine Supine to Sit: 6: Modified independent (Device/Increase time);HOB flat Sitting - Scoot to Edge of Bed: 6: Modified independent (Device/Increase time) Sit to Supine: 6: Modified independent (Device/Increase time);HOB flat  Trunk/Postural Assessment  Cervical Assessment Cervical Assessment: Within Functional Limits Thoracic Assessment Thoracic Assessment: Within Functional Limits Lumbar Assessment Lumbar Assessment: Within Functional Limits Postural Control Postural Control: Within Functional Limits  Balance   Extremity/Trunk Assessment RUE Assessment RUE Assessment: Exceptions to [facility] (in sling immobilized) LUE Assessment LUE Assessment: Within Functional Limits  See FIM for current functional status  Peri Maris 01/12/2014, 2:14 PM Pager: [phone]

## 2014-01-14 ENCOUNTER — Telehealth: Payer: Self-pay

## 2014-01-14 DIAGNOSIS — S7290XA Unspecified fracture of unspecified femur, initial encounter for closed fracture: Secondary | ICD-10-CM

## 2014-01-14 DIAGNOSIS — S72009A Fracture of unspecified part of neck of unspecified femur, initial encounter for closed fracture: Secondary | ICD-10-CM

## 2014-01-14 NOTE — Telephone Encounter (Signed)
[facility] with [facility] called to get an order for elevating leg rest for wheelchair.  Fax order to [phone].

## 2014-01-15 NOTE — Telephone Encounter (Signed)
Order placed for elevating leg rest for wheelchair.

## 2014-02-01 ENCOUNTER — Telehealth: Payer: Self-pay | Admitting: General Practice

## 2014-02-02 ENCOUNTER — Telehealth: Payer: Self-pay | Admitting: Physical Medicine and Rehabilitation

## 2014-02-02 ENCOUNTER — Telehealth: Payer: Self-pay | Admitting: General Practice

## 2014-02-02 NOTE — Telephone Encounter (Signed)
Contacted patient to obtain list of previous providers for medical records.  Patient stated he'd only been seen by Dr. Ouida Sills, internal medicine and any other time he was ill he would go to the emergency room at a [facility].  Per Erline Levine at Dr Roseanne Kaufman office, Mr. Bley has not been seen there as a patient.

## 2014-02-02 NOTE — Telephone Encounter (Signed)
Mrs Sweetman called in to inquire about establishing care. Advised new patient paperwork needs to be completed, records reviewed and approved by Medical Director. Received new patient packet via fax from [facility], Case Mgr, [employer] and [facility].

## 2014-02-02 NOTE — Telephone Encounter (Signed)
Mother called to state that patient had run out of medication. See telephone note

## 2014-02-03 ENCOUNTER — Telehealth: Payer: Self-pay

## 2014-02-03 NOTE — Telephone Encounter (Signed)
Left message for James Hamilton that patient needs to be seen in order to get oxycodone refill.

## 2014-02-03 NOTE — Telephone Encounter (Signed)
Pam and I have addressed at hospital. He was given refills. All further refills are to come through our office (pam is aware of this plan also)

## 2014-02-03 NOTE — Telephone Encounter (Signed)
Kiki called on behalf of patient.  He is out of his pain oxy 10mg  medication.  Patient is unable to do therapy because of this.  She would like to know if he can get a refill on his medication.  Please advise.

## 2014-02-04 ENCOUNTER — Telehealth: Payer: Self-pay

## 2014-02-04 NOTE — Telephone Encounter (Signed)
Merrilee Seashore at [facility] called to let us know that in order for the sickle cell program to pay for patients fentanyl and oxycodone the rx needs to have the pharmacies NPI number on it.  [facility] NPI is [id number].  Merrilee Seashore says he has never dealt with this before so he is unsure of any other details.

## 2014-02-10 ENCOUNTER — Telehealth: Payer: Self-pay

## 2014-02-10 NOTE — Telephone Encounter (Signed)
Patient's mother called clinic back and stated that patient has a "blue" card issued by [facility]le cell clinic and they help pay for patient's medication. Mother gave me the number to Onida @ [facility]. She said that the provider needed to fill out a form on [employer] and submit it so that the patient could get his Oxycodone at a discounted price. [place] emailed the process to me and I forwarded the email to [employer]. Pam is aware that this form needs to be filled out by a provider in order for the patient to receive his medication.

## 2014-02-10 NOTE — Telephone Encounter (Signed)
See prior note

## 2014-02-10 NOTE — Telephone Encounter (Signed)
[place] to see if patient has received Fentanyl and Oxycodone RX. Pharmacy Tech states patient has picked up Fentanyl refill on 3/25 and Oxycodone RX was deleted from system.

## 2014-02-10 NOTE — Telephone Encounter (Signed)
Attempted to contact patient's mother. Left a voicemail to return call to clinic. Need to know what is going on with patient's medication. Pam called from the hospital and said that the patient's mother called her and said they are having trouble getting the medication filled. Trying to get more information.

## 2014-02-16 ENCOUNTER — Other Ambulatory Visit: Payer: Self-pay | Admitting: Neurosurgery

## 2014-02-16 DIAGNOSIS — S06330A Contusion and laceration of cerebrum, unspecified, without loss of consciousness, initial encounter: Secondary | ICD-10-CM

## 2014-03-01 ENCOUNTER — Encounter: Payer: Self-pay | Admitting: Physical Medicine & Rehabilitation

## 2014-03-01 ENCOUNTER — Ambulatory Visit (INDEPENDENT_AMBULATORY_CARE_PROVIDER_SITE_OTHER): Payer: PRIVATE HEALTH INSURANCE | Admitting: Internal Medicine

## 2014-03-01 ENCOUNTER — Encounter: Payer: Medicaid Other | Attending: Physical Medicine & Rehabilitation | Admitting: Physical Medicine & Rehabilitation

## 2014-03-01 ENCOUNTER — Encounter: Payer: Self-pay | Admitting: Internal Medicine

## 2014-03-01 ENCOUNTER — Other Ambulatory Visit: Payer: Self-pay

## 2014-03-01 ENCOUNTER — Telehealth: Payer: Self-pay | Admitting: *Deleted

## 2014-03-01 VITALS — BP 159/59 | HR 87 | Resp 14 | Ht 73.0 in | Wt 155.0 lb

## 2014-03-01 VITALS — BP 120/69 | HR 84 | Temp 97.0°F | Resp 18 | Ht 73.0 in | Wt 148.0 lb

## 2014-03-01 DIAGNOSIS — E559 Vitamin D deficiency, unspecified: Secondary | ICD-10-CM

## 2014-03-01 DIAGNOSIS — IMO0002 Reserved for concepts with insufficient information to code with codable children: Secondary | ICD-10-CM

## 2014-03-01 DIAGNOSIS — S06369A Traumatic hemorrhage of cerebrum, unspecified, with loss of consciousness of unspecified duration, initial encounter: Secondary | ICD-10-CM

## 2014-03-01 DIAGNOSIS — S065X9A Traumatic subdural hemorrhage with loss of consciousness of unspecified duration, initial encounter: Secondary | ICD-10-CM

## 2014-03-01 DIAGNOSIS — S42309S Unspecified fracture of shaft of humerus, unspecified arm, sequela: Secondary | ICD-10-CM | POA: Diagnosis not present

## 2014-03-01 DIAGNOSIS — S0291XS Unspecified fracture of skull, sequela: Secondary | ICD-10-CM | POA: Diagnosis not present

## 2014-03-01 DIAGNOSIS — M545 Low back pain, unspecified: Secondary | ICD-10-CM | POA: Diagnosis present

## 2014-03-01 DIAGNOSIS — D649 Anemia, unspecified: Secondary | ICD-10-CM | POA: Insufficient documentation

## 2014-03-01 DIAGNOSIS — S0292XS Unspecified fracture of facial bones, sequela: Secondary | ICD-10-CM

## 2014-03-01 DIAGNOSIS — X58XXXS Exposure to other specified factors, sequela: Secondary | ICD-10-CM | POA: Insufficient documentation

## 2014-03-01 DIAGNOSIS — S42209A Unspecified fracture of upper end of unspecified humerus, initial encounter for closed fracture: Secondary | ICD-10-CM

## 2014-03-01 DIAGNOSIS — D5700 Hb-SS disease with crisis, unspecified: Secondary | ICD-10-CM | POA: Diagnosis not present

## 2014-03-01 DIAGNOSIS — D571 Sickle-cell disease without crisis: Secondary | ICD-10-CM

## 2014-03-01 DIAGNOSIS — S7290XA Unspecified fracture of unspecified femur, initial encounter for closed fracture: Secondary | ICD-10-CM

## 2014-03-01 DIAGNOSIS — S72009S Fracture of unspecified part of neck of unspecified femur, sequela: Secondary | ICD-10-CM | POA: Insufficient documentation

## 2014-03-01 DIAGNOSIS — S0636AA Traumatic hemorrhage of cerebrum, unspecified, with loss of consciousness status unknown, initial encounter: Secondary | ICD-10-CM

## 2014-03-01 DIAGNOSIS — S7291XA Unspecified fracture of right femur, initial encounter for closed fracture: Secondary | ICD-10-CM

## 2014-03-01 DIAGNOSIS — S065XAA Traumatic subdural hemorrhage with loss of consciousness status unknown, initial encounter: Secondary | ICD-10-CM

## 2014-03-01 MED ORDER — FENTANYL 25 MCG/HR TD PT72: 25.0000 ug | MEDICATED_PATCH | TRANSDERMAL | Status: DC

## 2014-03-01 MED ORDER — METHOCARBAMOL 500 MG PO TABS: 500.0000 mg | ORAL_TABLET | Freq: Four times a day (QID) | ORAL | Status: DC | PRN

## 2014-03-01 MED ORDER — FOLIC ACID 1 MG PO TABS: 1.0000 mg | ORAL_TABLET | Freq: Every day | ORAL | Status: DC

## 2014-03-01 MED ORDER — OXYCODONE HCL 10 MG PO TABS: 5.0000 mg | ORAL_TABLET | Freq: Four times a day (QID) | ORAL | Status: DC | PRN

## 2014-03-01 NOTE — Patient Instructions (Signed)
TRY NAPROXEN 1-2 TABS EVERY 12 HOURS.  Low Back Strain with Rehab A strain is an injury in which a tendon or muscle is torn. The muscles and tendons of the lower back are vulnerable to strains. However, these muscles and tendons are very strong and require a great force to be injured. Strains are classified into three categories. Grade 1 strains cause pain, but the tendon is not lengthened. Grade 2 strains include a lengthened ligament, due to the ligament being stretched or partially ruptured. With grade 2 strains there is still function, although the function may be decreased. Grade 3 strains involve a complete tear of the tendon or muscle, and function is usually impaired. SYMPTOMS   Pain in the lower back.  Pain that affects one side more than the other.  Pain that gets worse with movement and may be felt in the hip, buttocks, or back of the thigh.  Muscle spasms of the muscles in the back.  Swelling along the muscles of the back.  Loss of strength of the back muscles.  Crackling sound (crepitation) when the muscles are touched. CAUSES  Lower back strains occur when a force is placed on the muscles or tendons that is greater than they can handle. Common causes of injury include:  Prolonged overuse of the muscle-tendon units in the lower back, usually from incorrect posture.  A single violent injury or force applied to the back. RISK INCREASES WITH:  Sports that involve twisting forces on the spine or a lot of bending at the waist (football, rugby, weightlifting, bowling, golf, tennis, speed skating, racquetball, swimming, running, gymnastics, diving).  Poor strength and flexibility.  Failure to warm up properly before activity.  Family history of lower back pain or disk disorders.  Previous back injury or surgery (especially fusion).  Poor posture with lifting, especially heavy objects.  Prolonged sitting, especially with poor posture. PREVENTION   Learn and use proper  posture when sitting or lifting (maintain proper posture when sitting, lift using the knees and legs, not at the waist).  Warm up and stretch properly before activity.  Allow for adequate recovery between workouts.  Maintain physical fitness:  Strength, flexibility, and endurance.  Cardiovascular fitness. PROGNOSIS  If treated properly, lower back strains usually heal within 6 weeks. RELATED COMPLICATIONS   Recurring symptoms, resulting in a chronic problem.  Chronic inflammation, scarring, and partial muscle-tendon tear.  Delayed healing or resolution of symptoms.  Prolonged disability. TREATMENT  Treatment first involves the use of ice and medicine, to reduce pain and inflammation. The use of strengthening and stretching exercises may help reduce pain with activity. These exercises may be performed at home or with a therapist. Severe injuries may require referral to a therapist for further evaluation and treatment, such as ultrasound. Your caregiver may advise that you wear a back brace or corset, to help reduce pain and discomfort. Often, prolonged bed rest results in greater harm then benefit. Corticosteroid injections may be recommended. However, these should be reserved for the most serious cases. It is important to avoid using your back when lifting objects. At night, sleep on your back on a firm mattress with a pillow placed under your knees. If non-surgical treatment is unsuccessful, surgery may be needed.  MEDICATION   If pain medicine is needed, nonsteroidal anti-inflammatory medicines (aspirin and ibuprofen), or other minor pain relievers (acetaminophen), are often advised.  Do not take pain medicine for 7 days before surgery.  Prescription pain relievers may be given, if your  caregiver thinks they are needed. Use only as directed and only as much as you need.  Ointments applied to the skin may be helpful.  Corticosteroid injections may be given by your caregiver. These  injections should be reserved for the most serious cases, because they may only be given a certain number of times. HEAT AND COLD  Cold treatment (icing) should be applied for 10 to 15 minutes every 2 to 3 hours for inflammation and pain, and immediately after activity that aggravates your symptoms. Use ice packs or an ice massage.  Heat treatment may be used before performing stretching and strengthening activities prescribed by your caregiver, physical therapist, or athletic trainer. Use a heat pack or a warm water soak. SEEK MEDICAL CARE IF:   Symptoms get worse or do not improve in 2 to 4 weeks, despite treatment.  You develop numbness, weakness, or loss of bowel or bladder function.  New, unexplained symptoms develop. (Drugs used in treatment may produce side effects.) EXERCISES  RANGE OF MOTION (ROM) AND STRETCHING EXERCISES - Low Back Strain Most people with lower back pain will find that their symptoms get worse with excessive bending forward (flexion) or arching at the lower back (extension). The exercises which will help resolve your symptoms will focus on the opposite motion.  Your physician, physical therapist or athletic trainer will help you determine which exercises will be most helpful to resolve your lower back pain. Do not complete any exercises without first consulting with your caregiver. Discontinue any exercises which make your symptoms worse until you speak to your caregiver.  If you have pain, numbness or tingling which travels down into your buttocks, leg or foot, the goal of the therapy is for these symptoms to move closer to your back and eventually resolve. Sometimes, these leg symptoms will get better, but your lower back pain may worsen. This is typically an indication of progress in your rehabilitation. Be very alert to any changes in your symptoms and the activities in which you participated in the 24 hours prior to the change. Sharing this information with your  caregiver will allow him/her to most efficiently treat your condition.  These exercises may help you when beginning to rehabilitate your injury. Your symptoms may resolve with or without further involvement from your physician, physical therapist or athletic trainer. While completing these exercises, remember:  Restoring tissue flexibility helps normal motion to return to the joints. This allows healthier, less painful movement and activity.  An effective stretch should be held for at least 30 seconds.  A stretch should never be painful. You should only feel a gentle lengthening or release in the stretched tissue. FLEXION RANGE OF MOTION AND STRETCHING EXERCISES: STRETCH  Flexion, Single Knee to Chest   Lie on a firm bed or floor with both legs extended in front of you.  Keeping one leg in contact with the floor, bring your opposite knee to your chest. Hold your leg in place by either grabbing behind your thigh or at your knee.  Pull until you feel a gentle stretch in your lower back. Hold __________ seconds.  Slowly release your grasp and repeat the exercise with the opposite side. Repeat __________ times. Complete this exercise __________ times per day.  STRETCH  Flexion, Double Knee to Chest   Lie on a firm bed or floor with both legs extended in front of you.  Keeping one leg in contact with the floor, bring your opposite knee to your chest.  Tense your  stomach muscles to support your back and then lift your other knee to your chest. Hold your legs in place by either grabbing behind your thighs or at your knees.  Pull both knees toward your chest until you feel a gentle stretch in your lower back. Hold __________ seconds.  Tense your stomach muscles and slowly return one leg at a time to the floor. Repeat __________ times. Complete this exercise __________ times per day.  STRETCH  Low Trunk Rotation  Lie on a firm bed or floor. Keeping your legs in front of you, bend your knees  so they are both pointed toward the ceiling and your feet are flat on the floor.  Extend your arms out to the side. This will stabilize your upper body by keeping your shoulders in contact with the floor.  Gently and slowly drop both knees together to one side until you feel a gentle stretch in your lower back. Hold for __________ seconds.  Tense your stomach muscles to support your lower back as you bring your knees back to the starting position. Repeat the exercise to the other side. Repeat __________ times. Complete this exercise __________ times per day  EXTENSION RANGE OF MOTION AND FLEXIBILITY EXERCISES: STRETCH  Extension, Prone on Elbows   Lie on your stomach on the floor, a bed will be too soft. Place your palms about shoulder width apart and at the height of your head.  Place your elbows under your shoulders. If this is too painful, stack pillows under your chest.  Allow your body to relax so that your hips drop lower and make contact more completely with the floor.  Hold this position for __________ seconds.  Slowly return to lying flat on the floor. Repeat __________ times. Complete this exercise __________ times per day.  RANGE OF MOTION  Extension, Prone Press Ups  Lie on your stomach on the floor, a bed will be too soft. Place your palms about shoulder width apart and at the height of your head.  Keeping your back as relaxed as possible, slowly straighten your elbows while keeping your hips on the floor. You may adjust the placement of your hands to maximize your comfort. As you gain motion, your hands will come more underneath your shoulders.  Hold this position __________ seconds.  Slowly return to lying flat on the floor. Repeat __________ times. Complete this exercise __________ times per day.  RANGE OF MOTION- Quadruped, Neutral Spine   Assume a hands and knees position on a firm surface. Keep your hands under your shoulders and your knees under your hips. You may  place padding under your knees for comfort.  Drop your head and point your tail bone toward the ground below you. This will round out your lower back like an angry cat. Hold this position for __________ seconds.  Slowly lift your head and release your tail bone so that your back sags into a large arch, like an old horse.  Hold this position for __________ seconds.  Repeat this until you feel limber in your lower back.  Now, find your "sweet spot." This will be the most comfortable position somewhere between the two previous positions. This is your neutral spine. Once you have found this position, tense your stomach muscles to support your lower back.  Hold this position for __________ seconds. Repeat __________ times. Complete this exercise __________ times per day.  STRENGTHENING EXERCISES - Low Back Strain These exercises may help you when beginning to rehabilitate your injury.  These exercises should be done near your "sweet spot." This is the neutral, low-back arch, somewhere between fully rounded and fully arched, that is your least painful position. When performed in this safe range of motion, these exercises can be used for people who have either a flexion or extension based injury. These exercises may resolve your symptoms with or without further involvement from your physician, physical therapist or athletic trainer. While completing these exercises, remember:   Muscles can gain both the endurance and the strength needed for everyday activities through controlled exercises.  Complete these exercises as instructed by your physician, physical therapist or athletic trainer. Increase the resistance and repetitions only as guided.  You may experience muscle soreness or fatigue, but the pain or discomfort you are trying to eliminate should never worsen during these exercises. If this pain does worsen, stop and make certain you are following the directions exactly. If the pain is still present  after adjustments, discontinue the exercise until you can discuss the trouble with your caregiver. STRENGTHENING Deep Abdominals, Pelvic Tilt  Lie on a firm bed or floor. Keeping your legs in front of you, bend your knees so they are both pointed toward the ceiling and your feet are flat on the floor.  Tense your lower abdominal muscles to press your lower back into the floor. This motion will rotate your pelvis so that your tail bone is scooping upwards rather than pointing at your feet or into the floor.  With a gentle tension and even breathing, hold this position for __________ seconds. Repeat __________ times. Complete this exercise __________ times per day.  STRENGTHENING  Abdominals, Crunches   Lie on a firm bed or floor. Keeping your legs in front of you, bend your knees so they are both pointed toward the ceiling and your feet are flat on the floor. Cross your arms over your chest.  Slightly tip your chin down without bending your neck.  Tense your abdominals and slowly lift your trunk high enough to just clear your shoulder blades. Lifting higher can put excessive stress on the lower back and does not further strengthen your abdominal muscles.  Control your return to the starting position. Repeat __________ times. Complete this exercise __________ times per day.  STRENGTHENING  Quadruped, Opposite UE/LE Lift   Assume a hands and knees position on a firm surface. Keep your hands under your shoulders and your knees under your hips. You may place padding under your knees for comfort.  Find your neutral spine and gently tense your abdominal muscles so that you can maintain this position. Your shoulders and hips should form a rectangle that is parallel with the floor and is not twisted.  Keeping your trunk steady, lift your right hand no higher than your shoulder and then your left leg no higher than your hip. Make sure you are not holding your breath. Hold this position __________  seconds.  Continuing to keep your abdominal muscles tense and your back steady, slowly return to your starting position. Repeat with the opposite arm and leg. Repeat __________ times. Complete this exercise __________ times per day.  STRENGTHENING  Lower Abdominals, Double Knee Lift  Lie on a firm bed or floor. Keeping your legs in front of you, bend your knees so they are both pointed toward the ceiling and your feet are flat on the floor.  Tense your abdominal muscles to brace your lower back and slowly lift both of your knees until they come over your hips.  Be certain not to hold your breath.  Hold __________ seconds. Using your abdominal muscles, return to the starting position in a slow and controlled manner. Repeat __________ times. Complete this exercise __________ times per day.  POSTURE AND BODY MECHANICS CONSIDERATIONS - Low Back Strain Keeping correct posture when sitting, standing or completing your activities will reduce the stress put on different body tissues, allowing injured tissues a chance to heal and limiting painful experiences. The following are general guidelines for improved posture. Your physician or physical therapist will provide you with any instructions specific to your needs. While reading these guidelines, remember:  The exercises prescribed by your provider will help you have the flexibility and strength to maintain correct postures.  The correct posture provides the best environment for your joints to work. All of your joints have less wear and tear when properly supported by a spine with good posture. This means you will experience a healthier, less painful body.  Correct posture must be practiced with all of your activities, especially prolonged sitting and standing. Correct posture is as important when doing repetitive low-stress activities (typing) as it is when doing a single heavy-load activity (lifting). RESTING POSITIONS Consider which positions are most  painful for you when choosing a resting position. If you have pain with flexion-based activities (sitting, bending, stooping, squatting), choose a position that allows you to rest in a less flexed posture. You would want to avoid curling into a fetal position on your side. If your pain worsens with extension-based activities (prolonged standing, working overhead), avoid resting in an extended position such as sleeping on your stomach. Most people will find more comfort when they rest with their spine in a more neutral position, neither too rounded nor too arched. Lying on a non-sagging bed on your side with a pillow between your knees, or on your back with a pillow under your knees will often provide some relief. Keep in mind, being in any one position for a prolonged period of time, no matter how correct your posture, can still lead to stiffness. PROPER SITTING POSTURE In order to minimize stress and discomfort on your spine, you must sit with correct posture. Sitting with good posture should be effortless for a healthy body. Returning to good posture is a gradual process. Many people can work toward this most comfortably by using various supports until they have the flexibility and strength to maintain this posture on their own. When sitting with proper posture, your ears will fall over your shoulders and your shoulders will fall over your hips. You should use the back of the chair to support your upper back. Your lower back will be in a neutral position, just slightly arched. You may place a small pillow or folded towel at the base of your lower back for support.  When working at a desk, create an environment that supports good, upright posture. Without extra support, muscles tire, which leads to excessive strain on joints and other tissues. Keep these recommendations in mind: CHAIR:  A chair should be able to slide under your desk when your back makes contact with the back of the chair. This allows you to  work closely.  The chair's height should allow your eyes to be level with the upper part of your monitor and your hands to be slightly lower than your elbows. BODY POSITION  Your feet should make contact with the floor. If this is not possible, use a foot rest.  Keep your ears over your shoulders.  This will reduce stress on your neck and lower back. INCORRECT SITTING POSTURES  If you are feeling tired and unable to assume a healthy sitting posture, do not slouch or slump. This puts excessive strain on your back tissues, causing more damage and pain. Healthier options include:  Using more support, like a lumbar pillow.  Switching tasks to something that requires you to be upright or walking.  Talking a brief walk.  Lying down to rest in a neutral-spine position. PROLONGED STANDING WHILE SLIGHTLY LEANING FORWARD  When completing a task that requires you to lean forward while standing in one place for a long time, place either foot up on a stationary 2-4 inch high object to help maintain the best posture. When both feet are on the ground, the lower back tends to lose its slight inward curve. If this curve flattens (or becomes too large), then the back and your other joints will experience too much stress, tire more quickly, and can cause pain. CORRECT STANDING POSTURES Proper standing posture should be assumed with all daily activities, even if they only take a few moments, like when brushing your teeth. As in sitting, your ears should fall over your shoulders and your shoulders should fall over your hips. You should keep a slight tension in your abdominal muscles to brace your spine. Your tailbone should point down to the ground, not behind your body, resulting in an over-extended swayback posture.  INCORRECT STANDING POSTURES  Common incorrect standing postures include a forward head, locked knees and/or an excessive swayback. WALKING Walk with an upright posture. Your ears, shoulders and  hips should all line-up. PROLONGED ACTIVITY IN A FLEXED POSITION When completing a task that requires you to bend forward at your waist or lean over a low surface, try to find a way to stabilize 3 out of 4 of your limbs. You can place a hand or elbow on your thigh or rest a knee on the surface you are reaching across. This will provide you more stability so that your muscles do not fatigue as quickly. By keeping your knees relaxed, or slightly bent, you will also reduce stress across your lower back. CORRECT LIFTING TECHNIQUES DO :   Assume a wide stance. This will provide you more stability and the opportunity to get as close as possible to the object which you are lifting.  Tense your abdominals to brace your spine. Bend at the knees and hips. Keeping your back locked in a neutral-spine position, lift using your leg muscles. Lift with your legs, keeping your back straight.  Test the weight of unknown objects before attempting to lift them.  Try to keep your elbows locked down at your sides in order get the best strength from your shoulders when carrying an object.  Always ask for help when lifting heavy or awkward objects. INCORRECT LIFTING TECHNIQUES DO NOT:   Lock your knees when lifting, even if it is a small object.  Bend and twist. Pivot at your feet or move your feet when needing to change directions.  Assume that you can safely pick up even a paper clip without proper posture. Document Released: 10/29/2005 Document Revised: 01/21/2012 Document Reviewed: 02/10/2009 [facility] Patient Information 2014 [place], [place].

## 2014-03-01 NOTE — Telephone Encounter (Signed)
At pharmacy and they cannot fill because of needing prior auth on fentanyl and oxycodone.  Prior auths initiated on both medications and faxed to [facility].
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
## 2014-03-02 ENCOUNTER — Telehealth: Payer: Self-pay | Admitting: Physical Medicine & Rehabilitation

## 2014-03-02 LAB — URINALYSIS, COMPLETE
BACTERIA UA: NONE SEEN
Bilirubin Urine: NEGATIVE
CRYSTALS: NONE SEEN
Casts: NONE SEEN
Glucose, UA: NEGATIVE mg/dL
Hgb urine dipstick: NEGATIVE
KETONES UR: NEGATIVE mg/dL
Leukocytes, UA: NEGATIVE
NITRITE: NEGATIVE
Protein, ur: NEGATIVE mg/dL
RBC / HPF: NONE SEEN RBC/hpf (ref <3)
SQUAMOUS EPITHELIAL / LPF: NONE SEEN
Specific Gravity, Urine: 1.013 (ref 1.005–1.030)
UROBILINOGEN UA: 0.2 mg/dL (ref 0.0–1.0)
WBC, UA: NONE SEEN WBC/hpf (ref <3)
pH: 6 (ref 5.0–8.0)

## 2014-03-02 LAB — CBC WITH DIFFERENTIAL/PLATELET
Basophils Absolute: 0 10*3/uL (ref 0.0–0.1)
Basophils Relative: 0 % (ref 0–1)
EOS PCT: 3 % (ref 0–5)
Eosinophils Absolute: 0.2 10*3/uL (ref 0.0–0.7)
HEMATOCRIT: 38.9 % — AB (ref 39.0–52.0)
Hemoglobin: 12.6 g/dL — ABNORMAL LOW (ref 13.0–17.0)
LYMPHS ABS: 2.1 10*3/uL (ref 0.7–4.0)
Lymphocytes Relative: 28 % (ref 12–46)
MCH: 23.8 pg — ABNORMAL LOW (ref 26.0–34.0)
MCHC: 32.4 g/dL (ref 30.0–36.0)
MCV: 73.5 fL — AB (ref 78.0–100.0)
Monocytes Absolute: 0.4 10*3/uL (ref 0.1–1.0)
Monocytes Relative: 5 % (ref 3–12)
NEUTROS ABS: 4.8 10*3/uL (ref 1.7–7.7)
Neutrophils Relative %: 64 % (ref 43–77)
Platelets: 265 10*3/uL (ref 150–400)
RBC: 5.29 MIL/uL (ref 4.22–5.81)
RDW: 18.2 % — ABNORMAL HIGH (ref 11.5–15.5)
WBC: 7.5 10*3/uL (ref 4.0–10.5)

## 2014-03-02 LAB — RETICULOCYTES
ABS RETIC: 74.1 10*3/uL (ref 19.0–186.0)
RBC.: 5.29 MIL/uL (ref 4.22–5.81)
Retic Ct Pct: 1.4 % (ref 0.4–2.3)

## 2014-03-02 LAB — FERRITIN: FERRITIN: 265 ng/mL (ref 22–322)

## 2014-03-02 NOTE — Telephone Encounter (Signed)
Pt's mom called today; she states the pharmacy has not heard anything from you. Pt's mom was advise you were with pt's and will contact her today.

## 2014-03-03 ENCOUNTER — Ambulatory Visit
Admission: RE | Admit: 2014-03-03 | Discharge: 2014-03-03 | Disposition: A | Discharge status: Home / Self Care | Payer: Medicaid Other | Source: Ambulatory Visit | Attending: Neurosurgery | Admitting: Neurosurgery

## 2014-03-03 DIAGNOSIS — S06330A Contusion and laceration of cerebrum, unspecified, without loss of consciousness, initial encounter: Secondary | ICD-10-CM

## 2014-03-03 NOTE — Telephone Encounter (Signed)
Left message for Amareon mother to return my call.  Medicaid is telling me that it is inactive and has to be approved through Sickle Cell Program.  I have put a call through to Adah Perl who is over the services and am waiting for a call back. [phone]

## 2014-03-04 ENCOUNTER — Other Ambulatory Visit: Payer: Medicaid Other

## 2014-03-04 LAB — HEMOGLOBINOPATHY EVALUATION
HGB F QUANT: 4.7 % — AB (ref 0.0–2.0)
Hgb A2 Quant: 5.9 % — ABNORMAL HIGH (ref 2.2–3.2)
Hgb A: 28.6 % — ABNORMAL LOW (ref 96.8–97.8)
Hgb S Quant: 60.8 % — ABNORMAL HIGH

## 2014-03-04 NOTE — Telephone Encounter (Addendum)
I talked with Naaman Plummer @ [facility]  Sickle Cell Syndrome Program.  We tried to do a prior auth through [employer] division for Sickle Cell  For [facility] Fentanyl and Oxycodone.  It was unable to be completed with Dr Naaman Plummer as the provider.  We tried multiple times without success.  I placed a call to Margarite at [facility] at the request of Cale mom, but she is a CM and not a provider.  I placed another call to [place] with [facility] and left message with manager to return my call Roselyn [phone]) so that we can get the NP/MD to apply for the prioir authorization.  Otherwise Ayvion mom will have to pay out of pocket for the meds.  She had to do this last month and it is cost prohibitive.  I await a call back from [place].

## 2014-03-04 NOTE — Telephone Encounter (Signed)
FYI Dr Naaman Plummer, Graceann Congress does not have medicaid and his visits here will be billed as self pay.  He is covered under a sickle cell syndrome division of medicaid and it only covers sickle cell related medical issues.  I am reaching out to the [facility] Sickle Cell dept to get the MD/NP/PA to do the prior auth for the fentanyl and oxycodone.  I have not heard back.  They should probably be prescribing for him since this medication under your npi will be self pay.

## 2014-03-04 NOTE — Telephone Encounter (Signed)
i will probably need to review with Dr. Zigmund Daniel who is now in charge of the sickle cell clinic.  She is familiar with James Hamilton

## 2014-03-05 NOTE — Telephone Encounter (Signed)
Received a call from [facility] from Sickle Cell.  We discussed the process of the prior authorization for the fentanyl and oxycodone for [facility]. Roselyn Reef thinks that they can process the prior auth using Dr Naaman Plummer NPI as the ordering physician but use their credentials to place the request.  I gave her Dr Naaman Plummer # for Dr Rodman Key to contact if this cannot be done and she needs to talk with Dr Naaman Plummer.

## 2014-03-07 LAB — VITAMIN D 1,25 DIHYDROXY
VITAMIN D3 1, 25 (OH): 26 pg/mL
Vitamin D 1, 25 (OH)2 Total: 26 pg/mL (ref 18–72)

## 2014-03-09 ENCOUNTER — Telehealth: Payer: Self-pay | Admitting: *Deleted

## 2014-03-09 NOTE — Telephone Encounter (Signed)
I received a call back from [place] at Sickle Cell.  They have been able to put the prior auth through for James Hamilton and James Hamilton by entering it under their physician so it can go through sickle cell but using Dr Naaman Plummer NPI # as the ordering physician. It is pending but should be approved by today.

## 2014-03-09 NOTE — Telephone Encounter (Signed)
Notified Ms Mcconathy that approval has been granted from [facility] for his medications and they should be able to go to the pharmacy to get them now.

## 2014-03-26 ENCOUNTER — Other Ambulatory Visit: Payer: Self-pay

## 2014-03-26 ENCOUNTER — Encounter: Payer: Medicaid Other | Admitting: Registered Nurse

## 2014-03-26 ENCOUNTER — Telehealth: Payer: Self-pay | Admitting: *Deleted

## 2014-03-26 MED ORDER — OXYCODONE HCL 10 MG PO TABS: 5.0000 mg | ORAL_TABLET | Freq: Four times a day (QID) | ORAL | Status: DC | PRN

## 2014-03-26 MED ORDER — FENTANYL 25 MCG/HR TD PT72: 25.0000 ug | MEDICATED_PATCH | TRANSDERMAL | Status: DC

## 2014-03-26 NOTE — Telephone Encounter (Signed)
Baby was supposed to see [name] today 03/26/14.  They have medicaid covered by sickle cell foundation but no other insurance at present.  His self pay would be $125 and they do not have the money to pay it.  (I finally got the meds approved by using the sickle cell department assistance so his meds are covered under the sickle cell foundation medicaid, though his mother was having to pay out of pocket for his pain meds for a time after the last visit with you until we got them approved).  To spare them the inability to pay today, I had [name] write his rxs and gave them to his mother instead of him being seen today.  You will need to discuss this matter with Dr Zigmund Daniel and see if they will assume writing his medications. His medicaid only covers sickle cell related visits/ issues.

## 2014-03-26 NOTE — Telephone Encounter (Signed)
Patient came in for his OV, but his medicaid was only for sickle cell clinic. Patient was unable to pay $125. Per Golda Acre and [employer] for Fentanyl and Oxycodone printed for [place] to sign.

## 2014-03-29 ENCOUNTER — Ambulatory Visit: Payer: Medicaid Other | Admitting: Registered Nurse

## 2014-03-29 NOTE — Telephone Encounter (Signed)
Thanks i will contact her

## 2014-04-01 ENCOUNTER — Encounter: Payer: Self-pay | Admitting: Internal Medicine

## 2014-04-01 ENCOUNTER — Ambulatory Visit: Payer: PRIVATE HEALTH INSURANCE | Admitting: Internal Medicine

## 2014-04-01 VITALS — BP 119/56 | HR 70 | Temp 98.2°F | Resp 20 | Ht 73.0 in | Wt 132.0 lb

## 2014-04-01 DIAGNOSIS — D5740 Sickle-cell thalassemia without crisis: Secondary | ICD-10-CM

## 2014-04-01 DIAGNOSIS — Z0000 Encounter for general adult medical examination without abnormal findings: Secondary | ICD-10-CM

## 2014-04-01 DIAGNOSIS — T887XXA Unspecified adverse effect of drug or medicament, initial encounter: Secondary | ICD-10-CM

## 2014-04-01 MED ORDER — HYDROXYUREA 500 MG PO CAPS: 1000.0000 mg | ORAL_CAPSULE | Freq: Every day | ORAL | Status: DC

## 2014-04-01 NOTE — Progress Notes (Unsigned)
   Subjective:    Patient ID: James Hamilton, male    DOB: 1986/11/12, 28 y.o.   MRN: [id number]  HPI:Pt with Hb S/thal id still having pain despite use of analgesics. I have discussed with him in the past the use of Hydrea and he was resistant to it at that time.    Review of Systems     Objective:   Physical Exam        Assessment & Plan:  1. Hb S/thal: Pt still having pain despite use of analgesics.

## 2014-04-06 ENCOUNTER — Telehealth: Payer: Self-pay

## 2014-04-06 NOTE — Telephone Encounter (Signed)
Pt was contacted and spoken to about comming in to fill out a Pain Contract at his earliest convience.Pt was told his Mother will need to be placed on the contract as well.

## 2014-04-07 ENCOUNTER — Telehealth: Payer: Self-pay

## 2014-04-07 NOTE — Telephone Encounter (Signed)
(   Referral)Pt's notes as well as demo and labs were faxed over to [facility] today 04/07/2014. Office will be contacting Pt when they have 1st [place] Appointment.

## 2014-04-15 ENCOUNTER — Other Ambulatory Visit: Payer: Self-pay | Admitting: Internal Medicine

## 2014-04-15 ENCOUNTER — Other Ambulatory Visit: Payer: Self-pay

## 2014-04-15 DIAGNOSIS — D5740 Sickle-cell thalassemia without crisis: Secondary | ICD-10-CM

## 2014-04-15 DIAGNOSIS — Z0000 Encounter for general adult medical examination without abnormal findings: Secondary | ICD-10-CM

## 2014-04-15 DIAGNOSIS — T887XXA Unspecified adverse effect of drug or medicament, initial encounter: Secondary | ICD-10-CM

## 2014-04-15 LAB — COMPREHENSIVE METABOLIC PANEL
ALK PHOS: 102 U/L (ref 39–117)
ALT: 17 U/L (ref 0–53)
AST: 15 U/L (ref 0–37)
Albumin: 4.4 g/dL (ref 3.5–5.2)
BILIRUBIN TOTAL: 0.5 mg/dL (ref 0.2–1.2)
BUN: 13 mg/dL (ref 6–23)
CALCIUM: 9.6 mg/dL (ref 8.4–10.5)
CO2: 27 mEq/L (ref 19–32)
Chloride: 104 mEq/L (ref 96–112)
Creat: 1.22 mg/dL (ref 0.50–1.35)
Glucose, Bld: 91 mg/dL (ref 70–99)
POTASSIUM: 4.2 meq/L (ref 3.5–5.3)
Sodium: 140 mEq/L (ref 135–145)
Total Protein: 7.5 g/dL (ref 6.0–8.3)

## 2014-04-15 LAB — CBC WITH DIFFERENTIAL/PLATELET
BASOS PCT: 0 % (ref 0–1)
Basophils Absolute: 0 10*3/uL (ref 0.0–0.1)
Eosinophils Absolute: 0.3 10*3/uL (ref 0.0–0.7)
Eosinophils Relative: 5 % (ref 0–5)
HCT: 43.4 % (ref 39.0–52.0)
Hemoglobin: 14.9 g/dL (ref 13.0–17.0)
Lymphocytes Relative: 28 % (ref 12–46)
Lymphs Abs: 1.8 10*3/uL (ref 0.7–4.0)
MCH: 25 pg — ABNORMAL LOW (ref 26.0–34.0)
MCHC: 34.3 g/dL (ref 30.0–36.0)
MCV: 72.7 fL — ABNORMAL LOW (ref 78.0–100.0)
Monocytes Absolute: 0.3 10*3/uL (ref 0.1–1.0)
Monocytes Relative: 5 % (ref 3–12)
NEUTROS ABS: 4.1 10*3/uL (ref 1.7–7.7)
NEUTROS PCT: 62 % (ref 43–77)
PLATELETS: 210 10*3/uL (ref 150–400)
RBC: 5.97 MIL/uL — ABNORMAL HIGH (ref 4.22–5.81)
RDW: 18.3 % — ABNORMAL HIGH (ref 11.5–15.5)
WBC: 6.6 10*3/uL (ref 4.0–10.5)

## 2014-04-15 LAB — LIPID PANEL
CHOL/HDL RATIO: 6.7 ratio
CHOLESTEROL: 213 mg/dL — AB (ref 0–200)
HDL: 32 mg/dL — ABNORMAL LOW (ref >39)
LDL Cholesterol: 154 mg/dL — ABNORMAL HIGH (ref 0–99)
Triglycerides: 133 mg/dL (ref <150)
VLDL: 27 mg/dL (ref 0–40)

## 2014-04-16 ENCOUNTER — Telehealth: Payer: Self-pay | Admitting: Internal Medicine

## 2014-04-16 DIAGNOSIS — D571 Sickle-cell disease without crisis: Secondary | ICD-10-CM

## 2014-04-16 NOTE — Telephone Encounter (Signed)
Informed patient's mother of refills of robaxin already ordered at pharmacy. This RN To route oxycodone refill request to the Provider.

## 2014-04-16 NOTE — Telephone Encounter (Signed)
Called pharmacy to verify robaxin refills available. 3 refills of robaxin remain at this time. To notify patient.

## 2014-04-22 ENCOUNTER — Ambulatory Visit: Payer: Self-pay | Admitting: Family Medicine

## 2014-04-26 MED ORDER — OXYCODONE HCL 10 MG PO TABS: 5.0000 mg | ORAL_TABLET | Freq: Four times a day (QID) | ORAL | Status: DC | PRN

## 2014-04-26 NOTE — Telephone Encounter (Signed)
Prescription filled for Oxycodone 10 mg #100 tabs (1 month). Prescription 0.5-1 tabs q 6 hours PRN pain.

## 2014-04-26 NOTE — Addendum Note (Signed)
Addended by: Liston Alba A on: 04/26/2014 09:16 AM   Modules accepted: Orders

## 2014-04-28 ENCOUNTER — Encounter: Payer: Self-pay | Admitting: Family Medicine

## 2014-04-28 ENCOUNTER — Ambulatory Visit (INDEPENDENT_AMBULATORY_CARE_PROVIDER_SITE_OTHER): Payer: Medicaid Other | Admitting: Family Medicine

## 2014-04-28 VITALS — BP 121/77 | HR 98 | Temp 98.0°F | Resp 20 | Wt 147.0 lb

## 2014-04-28 DIAGNOSIS — F17200 Nicotine dependence, unspecified, uncomplicated: Secondary | ICD-10-CM

## 2014-04-28 DIAGNOSIS — Z720 Tobacco use: Secondary | ICD-10-CM

## 2014-04-28 DIAGNOSIS — E559 Vitamin D deficiency, unspecified: Secondary | ICD-10-CM

## 2014-04-28 DIAGNOSIS — D571 Sickle-cell disease without crisis: Secondary | ICD-10-CM

## 2014-04-28 MED ORDER — FENTANYL 25 MCG/HR TD PT72: 25.0000 ug | MEDICATED_PATCH | TRANSDERMAL | Status: DC

## 2014-04-28 MED ORDER — METHOCARBAMOL 500 MG PO TABS: 500.0000 mg | ORAL_TABLET | Freq: Four times a day (QID) | ORAL | Status: DC | PRN

## 2014-04-28 MED ORDER — ERGOCALCIFEROL 1.25 MG (50000 UT) PO CAPS: 50000.0000 [IU] | ORAL_CAPSULE | ORAL | Status: DC

## 2014-04-28 NOTE — Progress Notes (Signed)
Subjective:    Patient ID: James Hamilton, male    DOB: [birth year], 28 y.o.   MRN: [id number]  HPI  Patient with a history of sickle cell anemia presents for a follow up. Patient was counseled on starting hydroxyurea 1 month ago. Patient states that he did not start hydroxyurea after reading about its potential side effects. He states that he is still contemplating whether he will start the medication. He reports that he is having back pain today. He says that current pain intensity is 5/10 described at intermittent, localized, and aching. Patient is currently wearing a fentanyl patch. He reports that it needs to be changed today and pain typically increases slightly prior to changing patch. Patient state that pain is improved by Oxycodone, last taken 3 days ago, and heating pad.   Review of Systems  Constitutional: Negative.   HENT: Negative.   Eyes: Negative.   Respiratory: Negative.   Cardiovascular: Negative.   Gastrointestinal: Negative.   Endocrine: Negative.   Genitourinary: Negative.   Musculoskeletal: Positive for back pain and myalgias.  Skin: Negative.   Allergic/Immunologic: Negative.   Neurological: Negative.   Hematological: Negative.   Psychiatric/Behavioral: Negative.        Objective:   Physical Exam  Constitutional: He is oriented to person, place, and time. He appears well-developed and well-nourished.  HENT:  Head: Normocephalic and atraumatic.  Right Ear: External ear normal.  Left Ear: External ear normal.  Nose: Nose normal.  Mouth/Throat: Oropharynx is clear and moist.  Eyes: Conjunctivae and EOM are normal. Pupils are equal, round, and reactive to light.  Neck: Normal range of motion. Neck supple.  Cardiovascular: Normal rate, regular rhythm and normal heart sounds.   Pulmonary/Chest: Effort normal and breath sounds normal.  Abdominal: Soft. Bowel sounds are normal.  Musculoskeletal:       Lumbar back: He exhibits decreased range of motion,  tenderness, bony tenderness, pain and spasm. He exhibits no swelling.  Neurological: He is alert and oriented to person, place, and time. He has normal reflexes.  Skin: Skin is warm and dry.  Psychiatric: He has a normal mood and affect. His behavior is normal. Judgment and thought content normal.         Assessment & Plan:  1. Sickle cell disease: Patient states that pain intensity is 5/10. Pain is primarily in the back and right leg. Described at throbbing, aching, and nagging pain. Reports that being active helps take his mind off of the pain. Patient reports that his heating pad helps pain. Rx reordered for Fentanyl 25 mcg every 3 days, #10. Continue folic acid 1 mg daily. Increase water intake to 64 ounce per hour.  Discussed hydroxyurea at length. We discussed the benefits of Hydrea, including bone marrow suppression. Also discussed side effects including the possibility of GI upset, skin ulcers, hair thinning, and teratogenicity.  2. Vitamin D deficiency: Reviewed vitamin D results 1 month ago. Will start 50, 000 units weekly.  3.  Eye - High risk of proliferative retinopathy. Annual eye exam with retinal exam recommended to patient. Sent referral 4. Immunization status -  He is up to date on immunizations. Yearly influenza vaccination is recommended, as well as being up to date with Meningococcal and Pneumococcal vaccines.  5. Tobacco dependence: Patient state that he smokes 2-3 single cigarettes per day he does not but packs. He reports that he has quit smoking in the past. He is not interested in medication interventions to alleviate smoking.  RTC: 3 months Hollis,Lachina M, FNP

## 2014-05-17 ENCOUNTER — Telehealth: Payer: Self-pay

## 2014-05-17 DIAGNOSIS — D571 Sickle-cell disease without crisis: Secondary | ICD-10-CM

## 2014-05-17 NOTE — Telephone Encounter (Signed)
Refill request for Oxycodone / LOV 04/28/2014

## 2014-05-20 MED ORDER — OXYCODONE HCL 5 MG PO TABS: 5.0000 mg | ORAL_TABLET | ORAL | Status: DC | PRN

## 2014-05-20 NOTE — Telephone Encounter (Signed)
Oxycodone 5 mg every 4 hours as needed for moderate to severe pain #90.  Reviewed [place] Substance Reporting system prior to reorder

## 2014-05-28 ENCOUNTER — Telehealth: Payer: Self-pay

## 2014-05-28 DIAGNOSIS — D571 Sickle-cell disease without crisis: Secondary | ICD-10-CM

## 2014-05-28 MED ORDER — FOLIC ACID 1 MG PO TABS: 1.0000 mg | ORAL_TABLET | Freq: Every day | ORAL | Status: DC

## 2014-05-28 NOTE — Telephone Encounter (Addendum)
Prescription for folic acid, 1 mg tablets, refilled through patient pharmacy

## 2014-05-28 NOTE — Telephone Encounter (Signed)
Rx. Filled.

## 2014-05-28 NOTE — Addendum Note (Signed)
Addended by: Maryelizabeth Rowan on: 05/28/2014 10:13 AM   Modules accepted: Orders

## 2014-05-28 NOTE — Telephone Encounter (Signed)
Duplicate request

## 2014-06-07 ENCOUNTER — Encounter (HOSPITAL_COMMUNITY): Payer: Self-pay | Admitting: Emergency Medicine

## 2014-06-07 ENCOUNTER — Telehealth: Payer: Self-pay | Admitting: Internal Medicine

## 2014-06-07 ENCOUNTER — Emergency Department (HOSPITAL_COMMUNITY)
Admission: EM | Admit: 2014-06-07 | Discharge: 2014-06-07 | Disposition: A | Discharge status: Home / Self Care | Payer: Medicaid Other | Attending: Emergency Medicine | Admitting: Emergency Medicine

## 2014-06-07 ENCOUNTER — Emergency Department (HOSPITAL_COMMUNITY)
Admission: EM | Admit: 2014-06-07 | Discharge: 2014-06-07 | Disposition: A | Discharge status: Home / Self Care | Payer: Medicaid Other | Source: Home / Self Care | Attending: Emergency Medicine | Admitting: Emergency Medicine

## 2014-06-07 DIAGNOSIS — D571 Sickle-cell disease without crisis: Secondary | ICD-10-CM

## 2014-06-07 DIAGNOSIS — F17200 Nicotine dependence, unspecified, uncomplicated: Secondary | ICD-10-CM | POA: Diagnosis not present

## 2014-06-07 DIAGNOSIS — Z8781 Personal history of (healed) traumatic fracture: Secondary | ICD-10-CM | POA: Insufficient documentation

## 2014-06-07 DIAGNOSIS — M549 Dorsalgia, unspecified: Secondary | ICD-10-CM

## 2014-06-07 DIAGNOSIS — Z79899 Other long term (current) drug therapy: Secondary | ICD-10-CM | POA: Insufficient documentation

## 2014-06-07 DIAGNOSIS — M545 Low back pain, unspecified: Secondary | ICD-10-CM

## 2014-06-07 DIAGNOSIS — Z791 Long term (current) use of non-steroidal anti-inflammatories (NSAID): Secondary | ICD-10-CM | POA: Insufficient documentation

## 2014-06-07 DIAGNOSIS — M546 Pain in thoracic spine: Secondary | ICD-10-CM | POA: Insufficient documentation

## 2014-06-07 DIAGNOSIS — Z87820 Personal history of traumatic brain injury: Secondary | ICD-10-CM | POA: Insufficient documentation

## 2014-06-07 LAB — RETICULOCYTES
RBC.: 5.52 MIL/uL (ref 4.22–5.81)
RETIC COUNT ABSOLUTE: 60.7 10*3/uL (ref 19.0–186.0)
RETIC CT PCT: 1.1 % (ref 0.4–3.1)

## 2014-06-07 LAB — CBC WITH DIFFERENTIAL/PLATELET
BASOS PCT: 0 % (ref 0–1)
Basophils Absolute: 0 10*3/uL (ref 0.0–0.1)
EOS PCT: 2 % (ref 0–5)
Eosinophils Absolute: 0.2 10*3/uL (ref 0.0–0.7)
HCT: 39.8 % (ref 39.0–52.0)
HEMOGLOBIN: 13.9 g/dL (ref 13.0–17.0)
LYMPHS PCT: 29 % (ref 12–46)
Lymphs Abs: 2.3 10*3/uL (ref 0.7–4.0)
MCH: 25.2 pg — ABNORMAL LOW (ref 26.0–34.0)
MCHC: 34.9 g/dL (ref 30.0–36.0)
MCV: 72.1 fL — ABNORMAL LOW (ref 78.0–100.0)
MONOS PCT: 7 % (ref 3–12)
Monocytes Absolute: 0.5 10*3/uL (ref 0.1–1.0)
NEUTROS PCT: 62 % (ref 43–77)
Neutro Abs: 4.8 10*3/uL (ref 1.7–7.7)
PLATELETS: 218 10*3/uL (ref 150–400)
RBC: 5.52 MIL/uL (ref 4.22–5.81)
RDW: 14.9 % (ref 11.5–15.5)
WBC: 7.8 10*3/uL (ref 4.0–10.5)

## 2014-06-07 LAB — I-STAT CHEM 8, ED
BUN: 6 mg/dL (ref 6–23)
CREATININE: 1.1 mg/dL (ref 0.50–1.35)
Calcium, Ion: 1.17 mmol/L (ref 1.12–1.23)
Chloride: 101 mEq/L (ref 96–112)
Glucose, Bld: 80 mg/dL (ref 70–99)
HCT: 46 % (ref 39.0–52.0)
Hemoglobin: 15.6 g/dL (ref 13.0–17.0)
POTASSIUM: 3.4 meq/L — AB (ref 3.7–5.3)
SODIUM: 138 meq/L (ref 137–147)
TCO2: 26 mmol/L (ref 0–100)

## 2014-06-07 MED ORDER — METHOCARBAMOL 500 MG PO TABS: 500.0000 mg | ORAL_TABLET | Freq: Two times a day (BID) | ORAL | Status: DC

## 2014-06-07 MED ORDER — METHOCARBAMOL 1000 MG/10ML IJ SOLN
1000.0000 mg | Freq: Once | INTRAVENOUS | Status: AC
  Administered 2014-06-07: 1000 mg via INTRAVENOUS
  Filled 2014-06-07: qty 10

## 2014-06-07 MED ORDER — KETOROLAC TROMETHAMINE 30 MG/ML IJ SOLN
30.0000 mg | Freq: Once | INTRAMUSCULAR | Status: AC
  Administered 2014-06-07: 30 mg via INTRAVENOUS
  Filled 2014-06-07: qty 1

## 2014-06-07 MED ORDER — METHOCARBAMOL 1000 MG/10ML IJ SOLN: 1000.0000 mg | Freq: Once | INTRAMUSCULAR | Status: DC

## 2014-06-07 MED ORDER — FENTANYL 25 MCG/HR TD PT72: 25.0000 ug | MEDICATED_PATCH | TRANSDERMAL | Status: DC

## 2014-06-07 MED ORDER — FENTANYL CITRATE 0.05 MG/ML IJ SOLN
100.0000 ug | Freq: Once | INTRAMUSCULAR | Status: AC
  Administered 2014-06-07: 100 ug via INTRAVENOUS
  Filled 2014-06-07: qty 2

## 2014-06-07 MED ORDER — MELOXICAM 7.5 MG PO TABS
7.5000 mg | ORAL_TABLET | Freq: Every day | ORAL | Status: DC
Start: 2014-06-07 — End: 2015-01-19

## 2014-06-07 MED ORDER — FENTANYL 25 MCG/HR TD PT72
25.0000 ug | MEDICATED_PATCH | TRANSDERMAL | Status: DC
  Administered 2014-06-07: 25 ug via TRANSDERMAL
  Filled 2014-06-07: qty 1
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
## 2014-06-07 NOTE — Telephone Encounter (Signed)
Refill request for Fentanyl. LOV 04/28/2014 Please advise. Thanks!
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
## 2014-06-07 NOTE — Discharge Instructions (Signed)
1. Medications: usual home medications 2. Treatment: rest, drink plenty of fluids,  3. Follow Up: Please followup with your primary doctor in 3 days for discussion of your diagnoses and further evaluation after today's visit;    Back Exercises Back exercises help treat and prevent back injuries. The goal of back exercises is to increase the strength of your abdominal and back muscles and the flexibility of your back. These exercises should be started when you no longer have back pain. Back exercises include:  Pelvic Tilt. Lie on your back with your knees bent. Tilt your pelvis until the lower part of your back is against the floor. Hold this position 5 to 10 sec and repeat 5 to 10 times.  Knee to Chest. Pull first 1 knee up against your chest and hold for 20 to 30 seconds, repeat this with the other knee, and then both knees. This may be done with the other leg straight or bent, whichever feels better.  Sit-Ups or Curl-Ups. Bend your knees 90 degrees. Start with tilting your pelvis, and do a partial, slow sit-up, lifting your trunk only 30 to 45 degrees off the floor. Take at least 2 to 3 seconds for each sit-up. Do not do sit-ups with your knees out straight. If partial sit-ups are difficult, simply do the above but with only tightening your abdominal muscles and holding it as directed.  Hip-Lift. Lie on your back with your knees flexed 90 degrees. Push down with your feet and shoulders as you raise your hips a couple inches off the floor; hold for 10 seconds, repeat 5 to 10 times.  Back arches. Lie on your stomach, propping yourself up on bent elbows. Slowly press on your hands, causing an arch in your low back. Repeat 3 to 5 times. Any initial stiffness and discomfort should lessen with repetition over time.  Shoulder-Lifts. Lie face down with arms beside your body. Keep hips and torso pressed to floor as you slowly lift your head and shoulders off the floor. Do not overdo your exercises,  especially in the beginning. Exercises may cause you some mild back discomfort which lasts for a few minutes; however, if the pain is more severe, or lasts for more than 15 minutes, do not continue exercises until you see your caregiver. Improvement with exercise therapy for back problems is slow.  See your caregivers for assistance with developing a proper back exercise program. Document Released: 12/06/2004 Document Revised: 01/21/2012 Document Reviewed: 08/30/2011 [facility] Patient Information 2015 [place]. This information is not intended to replace advice given to you by your health care provider. Make sure you discuss any questions you have with your health care provider.

## 2014-06-07 NOTE — Telephone Encounter (Signed)
Prescription refilled for Fentanyl 25 mcg/hr # 10 patches.

## 2014-06-07 NOTE — Discharge Instructions (Signed)

## 2014-06-07 NOTE — ED Notes (Signed)
Pt not able to provide urine.
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
## 2014-06-08 NOTE — ED Provider Notes (Signed)
  Medical screening examination/treatment/procedure(s) were performed by non-physician practitioner and as supervising physician I was immediately available for consultation/collaboration.   EKG Interpretation None         Carmin Muskrat, MD 06/08/14 [phone]

## 2014-06-13 ENCOUNTER — Emergency Department (HOSPITAL_BASED_OUTPATIENT_CLINIC_OR_DEPARTMENT_OTHER)
Admission: EM | Admit: 2014-06-13 | Discharge: 2014-06-13 | Disposition: A | Discharge status: Home / Self Care | Payer: Medicaid Other | Attending: Emergency Medicine | Admitting: Emergency Medicine

## 2014-06-13 ENCOUNTER — Encounter (HOSPITAL_BASED_OUTPATIENT_CLINIC_OR_DEPARTMENT_OTHER): Payer: Self-pay | Admitting: Emergency Medicine

## 2014-06-13 DIAGNOSIS — Z862 Personal history of diseases of the blood and blood-forming organs and certain disorders involving the immune mechanism: Secondary | ICD-10-CM | POA: Diagnosis not present

## 2014-06-13 DIAGNOSIS — R51 Headache: Secondary | ICD-10-CM | POA: Insufficient documentation

## 2014-06-13 DIAGNOSIS — Z87820 Personal history of traumatic brain injury: Secondary | ICD-10-CM | POA: Insufficient documentation

## 2014-06-13 DIAGNOSIS — M549 Dorsalgia, unspecified: Secondary | ICD-10-CM | POA: Diagnosis present

## 2014-06-13 DIAGNOSIS — M545 Low back pain, unspecified: Secondary | ICD-10-CM | POA: Diagnosis not present

## 2014-06-13 DIAGNOSIS — Z8781 Personal history of (healed) traumatic fracture: Secondary | ICD-10-CM | POA: Insufficient documentation

## 2014-06-13 DIAGNOSIS — Z791 Long term (current) use of non-steroidal anti-inflammatories (NSAID): Secondary | ICD-10-CM | POA: Diagnosis not present

## 2014-06-13 DIAGNOSIS — F17200 Nicotine dependence, unspecified, uncomplicated: Secondary | ICD-10-CM | POA: Insufficient documentation

## 2014-06-13 DIAGNOSIS — G8929 Other chronic pain: Secondary | ICD-10-CM

## 2014-06-13 DIAGNOSIS — Z79899 Other long term (current) drug therapy: Secondary | ICD-10-CM | POA: Insufficient documentation

## 2014-06-13 MED ORDER — FENTANYL 25 MCG/HR TD PT72
25.0000 ug | MEDICATED_PATCH | TRANSDERMAL | Status: DC
  Filled 2014-06-13: qty 1

## 2014-06-13 MED ORDER — MORPHINE SULFATE 4 MG/ML IJ SOLN
6.0000 mg | Freq: Once | INTRAMUSCULAR | Status: AC
  Administered 2014-06-13: 6 mg via INTRAMUSCULAR
  Filled 2014-06-13: qty 2
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
## 2014-06-13 NOTE — Discharge Instructions (Signed)
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
## 2014-06-16 ENCOUNTER — Telehealth: Payer: Self-pay

## 2014-06-16 NOTE — Telephone Encounter (Signed)
Received a Fax back from [facility] care patient has an appointment for eye exam on 07/29/2014@2 :45pm. Thanks!

## 2014-06-21 ENCOUNTER — Telehealth: Payer: Self-pay | Admitting: Internal Medicine

## 2014-06-21 MED ORDER — OXYCODONE HCL 5 MG PO TABS: 5.0000 mg | ORAL_TABLET | ORAL | Status: DC | PRN

## 2014-06-21 NOTE — Telephone Encounter (Signed)
Re-ordered Oxycodone 5 mg every 4 hours as needed #90 Reviewed [place] Substance Reporting system prior to reorder   Meds ordered this encounter  Medications  . oxyCODONE (OXY IR/ROXICODONE) 5 MG immediate release tablet    Sig: Take 1 tablet (5 mg total) by mouth every 4 (four) hours as needed for severe pain.    Dispense:  90 tablet    Refill:  0

## 2014-06-21 NOTE — Telephone Encounter (Signed)
Medication refill oxyCODONE (OXY IR/ROXICODONE) 10 MG  / LOV 04/28/2014

## 2014-06-21 NOTE — Telephone Encounter (Signed)
Re-ordered Oxycodone 5 mg every 4 hours as needed for severe pain. #90 Reviewed [place] Substance Reporting system prior to reorder  Dorena Dew, FNP

## 2014-06-23 ENCOUNTER — Telehealth: Payer: Self-pay | Admitting: Internal Medicine

## 2014-06-23 NOTE — Telephone Encounter (Signed)
Patient would like to know why RX for Oxycodone was changed from 10mg  q6 #100 to 5mg  q4 #90.
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
## 2014-06-28 NOTE — Telephone Encounter (Signed)
[place], Patient is asking why his rx has been changed to the 5mg  q4hours. He was getting 10mg  q6hours up until June last given by dr. Zigmund Daniel. Please advise. Thanks!

## 2014-06-29 NOTE — Telephone Encounter (Signed)
Reviewed medication history, patient was previously prescribed Oxycodone 10 mg every 6 hours. Discussed medication regimen with patient at length. Will pick up Rx refill on 07/06/2014

## 2014-07-06 ENCOUNTER — Telehealth: Payer: Self-pay

## 2014-07-06 ENCOUNTER — Other Ambulatory Visit: Payer: Self-pay

## 2014-07-06 NOTE — Telephone Encounter (Signed)
Refill request for oxycodone 5mg . LOV 04/28/2014. Please advise. Thanks!

## 2014-07-06 NOTE — Telephone Encounter (Signed)
Refill request for Oxycodone 5mg . LOV 04/28/2014. Please advise. Thanks!

## 2014-07-07 MED ORDER — OXYCODONE HCL 5 MG PO TABS: 5.0000 mg | ORAL_TABLET | ORAL | Status: DC | PRN

## 2014-07-07 NOTE — Telephone Encounter (Signed)
Meds ordered this encounter  Medications  . oxyCODONE (OXY IR/ROXICODONE) 5 MG immediate release tablet    Sig: Take 1 tablet (5 mg total) by mouth every 4 (four) hours as needed for severe pain.    Dispense:  90 tablet    Refill:  0  Rafeal Skibicki M Reviewed  Substance Reporting system prior to reorder

## 2014-07-09 ENCOUNTER — Telehealth (HOSPITAL_COMMUNITY): Payer: Self-pay | Admitting: *Deleted

## 2014-07-09 NOTE — Telephone Encounter (Signed)
Information is provided regarding [facility] event over the telephone call with aunt Stanton Kidney, All questions answered;  Mayra Neer

## 2014-07-12 ENCOUNTER — Telehealth: Payer: Self-pay | Admitting: Internal Medicine

## 2014-07-12 DIAGNOSIS — D571 Sickle-cell disease without crisis: Secondary | ICD-10-CM

## 2014-07-12 MED ORDER — FENTANYL 25 MCG/HR TD PT72: 25.0000 ug | MEDICATED_PATCH | TRANSDERMAL | Status: DC

## 2014-07-12 NOTE — Telephone Encounter (Signed)
Refill request for fentanyl 74mcg patch LOV 04/28/2014. Please advise. Thanks!

## 2014-07-12 NOTE — Telephone Encounter (Signed)
Meds ordered this encounter  Medications  . fentaNYL (DURAGESIC - DOSED MCG/HR) 25 MCG/HR patch    Sig: Place 1 patch (25 mcg total) onto the skin every 3 (three) days.    Dispense:  10 patch    Refill:  0  Reviewed [place] Substance Reporting system prior to reorder   Dorena Dew, FNP

## 2014-07-23 ENCOUNTER — Telehealth: Payer: Self-pay | Admitting: Internal Medicine

## 2014-07-23 DIAGNOSIS — D5700 Hb-SS disease with crisis, unspecified: Secondary | ICD-10-CM

## 2014-07-26 ENCOUNTER — Telehealth: Payer: Self-pay | Admitting: Internal Medicine

## 2014-07-26 MED ORDER — OXYCODONE HCL 5 MG PO TABS: 5.0000 mg | ORAL_TABLET | ORAL | Status: DC | PRN

## 2014-07-26 NOTE — Telephone Encounter (Signed)
Meds ordered this encounter  Medications  . oxyCODONE (OXY IR/ROXICODONE) 5 MG immediate release tablet    Sig: Take 1 tablet (5 mg total) by mouth every 4 (four) hours as needed for severe pain.    Dispense:  90 tablet    Refill:  0    Order Specific Question:  Supervising Provider    Answer:  MATTHEWS, MICHELLE A [[id number]]  Reviewed [place] Substance Reporting system prior to reorder Selenia Mihok M, FNP 

## 2014-07-26 NOTE — Telephone Encounter (Signed)
Refill request for oxycodone 5mg  LOV 04/28/2014 please advise. Thanks!

## 2014-07-29 ENCOUNTER — Encounter: Payer: Medicaid Other | Admitting: Internal Medicine

## 2014-08-05 ENCOUNTER — Ambulatory Visit: Payer: PRIVATE HEALTH INSURANCE | Admitting: Internal Medicine

## 2014-08-06 ENCOUNTER — Telehealth: Payer: Self-pay | Admitting: Internal Medicine

## 2014-08-06 DIAGNOSIS — D571 Sickle-cell disease without crisis: Secondary | ICD-10-CM

## 2014-08-06 DIAGNOSIS — D5700 Hb-SS disease with crisis, unspecified: Secondary | ICD-10-CM

## 2014-08-09 MED ORDER — OXYCODONE HCL 5 MG PO TABS: 5.0000 mg | ORAL_TABLET | ORAL | Status: DC | PRN

## 2014-08-09 MED ORDER — FENTANYL 25 MCG/HR TD PT72: 25.0000 ug | MEDICATED_PATCH | TRANSDERMAL | Status: DC

## 2014-08-09 NOTE — Telephone Encounter (Signed)
Ordered Oxycodone IR 5 mg every 4 hours in error. Rx voided.   Ordered Fentanyl 25 mcg/hour patch. Place patch every 3 days. # 10  Reviewed [place] Substance Reporting system prior to reorder

## 2014-08-10 ENCOUNTER — Telehealth: Payer: Self-pay | Admitting: Internal Medicine

## 2014-08-10 DIAGNOSIS — D5700 Hb-SS disease with crisis, unspecified: Secondary | ICD-10-CM

## 2014-08-10 NOTE — Telephone Encounter (Signed)
Refill request for oxycodone 5mg . LOV 04/28/2014 . Please advise. Thanks!

## 2014-08-12 MED ORDER — OXYCODONE HCL 5 MG PO TABS: 5.0000 mg | ORAL_TABLET | ORAL | Status: DC | PRN

## 2014-08-12 NOTE — Telephone Encounter (Signed)
Meds ordered this encounter  Medications  . oxyCODONE (OXY IR/ROXICODONE) 5 MG immediate release tablet    Sig: Take 1 tablet (5 mg total) by mouth every 4 (four) hours as needed for severe pain.    Dispense:  90 tablet    Refill:  0    Order Specific Question:  Supervising Provider    Answer:  Liston Alba A [[id number]]  Khylon Davies M, FNP Reviewed [place] Substance Reporting system prior to reorder

## 2014-08-13 ENCOUNTER — Other Ambulatory Visit: Payer: PRIVATE HEALTH INSURANCE

## 2014-08-17 LAB — OPIATES/OPIOIDS (LC/MS-MS)
CODEINE URINE: NEGATIVE ng/mL (ref <50)
Hydrocodone: NEGATIVE ng/mL (ref <50)
Hydromorphone: NEGATIVE ng/mL (ref <50)
Morphine Urine: NEGATIVE ng/mL (ref <50)
Norhydrocodone, Ur: NEGATIVE ng/mL (ref <50)
Noroxycodone, Ur: 1175 ng/mL — AB (ref <50)
OXYCODONE, UR: 362 ng/mL — AB (ref <50)
OXYMORPHONE, URINE: 2017 ng/mL — AB (ref <50)

## 2014-08-17 LAB — CANNABANOIDS (GC/LC/MS), URINE: THC-COOH (GC/LC/MS), ur confirm: 33 ng/mL — AB (ref <5)

## 2014-08-17 LAB — OXYCODONE, URINE (LC/MS-MS)
Noroxycodone, Ur: 1175 ng/mL — AB (ref <50)
OXYCODONE, UR: 362 ng/mL — AB (ref <50)
Oxymorphone: 2017 ng/mL — AB (ref <50)

## 2014-08-18 LAB — PRESCRIPTION MONITORING PROFILE (SOLSTAS)
Amphetamine/Meth: NEGATIVE ng/mL
BENZODIAZEPINE SCREEN, URINE: NEGATIVE ng/mL
Barbiturate Screen, Urine: NEGATIVE ng/mL
Buprenorphine, Urine: NEGATIVE ng/mL
CARISOPRODOL, URINE: NEGATIVE ng/mL
COCAINE METABOLITES: NEGATIVE ng/mL
CREATININE, URINE: 143.79 mg/dL (ref >20.0)
FENTANYL URINE: NEGATIVE ng/mL
MDMA URINE: NEGATIVE ng/mL
METHADONE SCREEN, URINE: NEGATIVE ng/mL
Meperidine, Ur: NEGATIVE ng/mL
Nitrites, Initial: NEGATIVE ug/mL
PH URINE, INITIAL: 6.3 pH (ref 4.5–8.9)
Propoxyphene: NEGATIVE ng/mL
Tapentadol, urine: NEGATIVE ng/mL
Tramadol Scrn, Ur: NEGATIVE ng/mL
ZOLPIDEM, URINE: NEGATIVE ng/mL

## 2014-08-24 ENCOUNTER — Telehealth: Payer: Self-pay | Admitting: Internal Medicine

## 2014-08-24 DIAGNOSIS — D5700 Hb-SS disease with crisis, unspecified: Secondary | ICD-10-CM

## 2014-08-24 NOTE — Telephone Encounter (Signed)
Medication refill request for oxyCODONE (OXY IR/ROXICODONE) 5 MG immediate release tablet / LOV 04/28/2014 /

## 2014-08-25 MED ORDER — OXYCODONE HCL 5 MG PO TABS: 5.0000 mg | ORAL_TABLET | ORAL | Status: DC | PRN

## 2014-08-25 NOTE — Telephone Encounter (Signed)
Meds ordered this encounter  Medications  . oxyCODONE (OXY IR/ROXICODONE) 5 MG immediate release tablet    Sig: Take 1 tablet (5 mg total) by mouth every 4 (four) hours as needed for severe pain.    Dispense:  90 tablet    Refill:  0  James Hamilton has to be assessed by a provider every 3 months in order to receive prescription medications per contract. He needs to schedule follow up appointment prior to receiving pain medication. Reviewed [place] Substance Reporting system prior to reorder   Dorena Dew, FNP

## 2014-08-30 ENCOUNTER — Encounter: Payer: Self-pay | Admitting: Family Medicine

## 2014-08-30 ENCOUNTER — Ambulatory Visit (INDEPENDENT_AMBULATORY_CARE_PROVIDER_SITE_OTHER): Payer: Medicaid Other | Admitting: Family Medicine

## 2014-08-30 VITALS — BP 125/83 | HR 77 | Temp 98.3°F | Resp 14 | Ht 73.0 in | Wt 149.0 lb

## 2014-08-30 DIAGNOSIS — G8929 Other chronic pain: Secondary | ICD-10-CM

## 2014-08-30 DIAGNOSIS — D571 Sickle-cell disease without crisis: Secondary | ICD-10-CM | POA: Diagnosis not present

## 2014-08-30 DIAGNOSIS — Z720 Tobacco use: Secondary | ICD-10-CM

## 2014-08-30 DIAGNOSIS — E559 Vitamin D deficiency, unspecified: Secondary | ICD-10-CM | POA: Diagnosis not present

## 2014-08-30 DIAGNOSIS — Z23 Encounter for immunization: Secondary | ICD-10-CM | POA: Insufficient documentation

## 2014-08-30 LAB — CBC WITH DIFFERENTIAL/PLATELET
BASOS PCT: 0 % (ref 0–1)
Basophils Absolute: 0 10*3/uL (ref 0.0–0.1)
Eosinophils Absolute: 0.1 10*3/uL (ref 0.0–0.7)
Eosinophils Relative: 1 % (ref 0–5)
HCT: 42.1 % (ref 39.0–52.0)
Hemoglobin: 14.2 g/dL (ref 13.0–17.0)
Lymphocytes Relative: 21 % (ref 12–46)
Lymphs Abs: 2.3 10*3/uL (ref 0.7–4.0)
MCH: 25 pg — AB (ref 26.0–34.0)
MCHC: 33.7 g/dL (ref 30.0–36.0)
MCV: 74.1 fL — ABNORMAL LOW (ref 78.0–100.0)
Monocytes Absolute: 0.4 10*3/uL (ref 0.1–1.0)
Monocytes Relative: 4 % (ref 3–12)
NEUTROS PCT: 74 % (ref 43–77)
Neutro Abs: 8.1 10*3/uL — ABNORMAL HIGH (ref 1.7–7.7)
PLATELETS: 301 10*3/uL (ref 150–400)
RBC: 5.68 MIL/uL (ref 4.22–5.81)
RDW: 14.9 % (ref 11.5–15.5)
WBC: 10.9 10*3/uL — ABNORMAL HIGH (ref 4.0–10.5)

## 2014-08-30 LAB — COMPLETE METABOLIC PANEL WITH GFR
ALT: 13 U/L (ref 0–53)
AST: 15 U/L (ref 0–37)
Albumin: 4.7 g/dL (ref 3.5–5.2)
Alkaline Phosphatase: 90 U/L (ref 39–117)
BILIRUBIN TOTAL: 0.4 mg/dL (ref 0.2–1.2)
BUN: 6 mg/dL (ref 6–23)
CO2: 26 mEq/L (ref 19–32)
Calcium: 9.5 mg/dL (ref 8.4–10.5)
Chloride: 104 mEq/L (ref 96–112)
Creat: 1.02 mg/dL (ref 0.50–1.35)
GFR, Est African American: >89 mL/min
Glucose, Bld: 89 mg/dL (ref 70–99)
Potassium: 4.1 mEq/L (ref 3.5–5.3)
SODIUM: 141 meq/L (ref 135–145)
TOTAL PROTEIN: 7.2 g/dL (ref 6.0–8.3)

## 2014-08-30 MED ORDER — HYDROXYUREA 500 MG PO CAPS: 500.0000 mg | ORAL_CAPSULE | Freq: Every day | ORAL | Status: DC

## 2014-08-30 MED ORDER — FOLIC ACID 1 MG PO TABS: 1.0000 mg | ORAL_TABLET | Freq: Every day | ORAL | Status: DC

## 2014-08-30 NOTE — Patient Instructions (Addendum)
Contact [facility] for resourcesHydroxyurea capsules What is this medicine? HYDROXYUREA (hye drox ee yoor EE a) is a chemotherapy drug. It slows the growth of cancer cells. This medicine is used to treat certain leukemias, skin cancer, head and neck cancer, and advanced ovarian cancer. It is also used to control the painful crises of sickle cell anemia. This medicine may be used for other purposes; ask your health care provider or pharmacist if you have questions. COMMON BRAND NAME(S): Droxia, Hydrea What should I tell my health care provider before I take this medicine? They need to know if you have any of these conditions: -immune system problems -infection (especially a virus infection such as chickenpox, cold sores, or herpes) -kidney disease -low blood counts, like low white cell, platelet, or red cell counts -previous or ongoing radiation therapy -an unusual or allergic reaction to hydroxyurea, other chemotherapy, other medicines, foods, dyes, or preservatives -pregnant or trying to get pregnant -breast-feeding How should I use this medicine? Take this medicine by mouth with a glass of water. Follow the directions on the prescription label. Take your medicine at regular intervals. Do not take it more often than directed. Do not stop taking except on your doctor's advice. People who are not taking this medicine should not be exposed to it. Wash your hands before and after handling your bottle or medicine. Caregivers should wear disposable gloves if they must touch the bottle or medicine. Clean up any medicine powder that spills with a damp disposable towel and throw the towel away in a closed container, such as a plastic bag. Talk to your pediatrician regarding the use of this medicine in children. Special care may be needed. Patients over 47 years old may have a stronger reaction and need a smaller dose. Overdosage: If you think you have taken too much of this medicine  contact a poison control center or emergency room at once. NOTE: This medicine is only for you. Do not share this medicine with others. What if I miss a dose? If you miss a dose, take it as soon as you can. If it is almost time for your next dose, take only that dose. Do not take double or extra doses. What may interact with this medicine? -didanosine -other chemotherapy agents -stavudine -tenofovir -vaccines This list may not describe all possible interactions. Give your health care provider a list of all the medicines, herbs, non-prescription drugs, or dietary supplements you use. Also tell them if you smoke, drink alcohol, or use illegal drugs. Some items may interact with your medicine. What should I watch for while using this medicine? This drug may make you feel generally unwell. This is not uncommon, as chemotherapy can affect healthy cells as well as cancer cells. Report any side effects. Continue your course of treatment even though you feel ill unless your doctor tells you to stop. You will receive regular blood tests during your treatment. Call your doctor or health care professional for advice if you get a fever, chills or sore throat, or other symptoms of a cold or flu. Do not treat yourself. This drug decreases your body's ability to fight infections. Try to avoid being around people who are sick. This medicine may increase your risk to bruise or bleed. Call your doctor or health care professional if you notice any unusual bleeding. Be careful brushing and flossing your teeth or using a toothpick because you may get an infection or bleed more easily. If you have any dental work  done, tell your dentist you are receiving this medicine. Avoid taking products that contain aspirin, acetaminophen, ibuprofen, naproxen, or ketoprofen unless instructed by your doctor. These medicines may hide a fever. Do not become pregnant while taking this medicine. Women should inform their doctor if they  wish to become pregnant or think they might be pregnant. There is a potential for serious side effects to an unborn child. Men should inform their doctors if they wish to father a child. This medicine may lower sperm counts. Talk to your health care professional or pharmacist for more information. Do not breast-feed an infant while taking this medicine. What side effects may I notice from receiving this medicine? Side effects that you should report to your doctor or health care professional as soon as possible: -allergic reactions like skin rash, itching or hives, swelling of the face, lips, or tongue -low blood counts - this medicine may decrease the number of white blood cells, red blood cells and platelets. You may be at increased risk for infections and bleeding. -signs of infection - fever or chills, cough, sore throat, pain or difficulty passing urine -signs of decreased platelets or bleeding - bruising, pinpoint red spots on the skin, black, tarry stools, blood in the urine -signs of decreased red blood cells - unusually weak or tired, fainting spells, lightheadedness -breathing problems -burning, redness or pain at the site of any radiation therapy -changes in skin color -confusion -mouth sores -pain, tingling, numbness in the hands or feet -seizures -skin ulcers -trouble passing urine or change in the amount of urine -vomiting Side effects that usually do not require medical attention (report to your doctor or health care professional if they continue or are bothersome): -headache -loss of appetite -red color to the face This list may not describe all possible side effects. Call your doctor for medical advice about side effects. You may report side effects to FDA at 1-800-FDA-1088. Where should I keep my medicine? Keep out of the reach of children. Store at room temperature between 15 and 30 degrees C (59 and 86 degrees F). Keep tightly closed. Throw away any unused medicine after  the expiration date. NOTE: This sheet is a summary. It may not cover all possible information. If you have questions about this medicine, talk to your doctor, pharmacist, or health care provider.  2015, Elsevier/Gold Standard. (2008-03-12 15:03:29)

## 2014-08-30 NOTE — Progress Notes (Signed)
Subjective:    Patient ID: James Hamilton, male    DOB: [birth year], 28 y.o.   MRN: [id number]  HPI Patient with a history of sickle cell anemia presents for a 3 month follow up. Patient states that he is not currently taking folic acid or hydroxyurea. He states that both medications have been discussed in the past.  Patient was counseled on starting hydroxyurea 3 months ago. Patient states that he did not start hydroxyurea after reading about its potential side effects. He states that he is still contemplating whether he will start the medication. He reports that he is having back pain today. He says that current pain intensity is 6/10 described at intermittent, localized, and aching. Patient is currently wearing a fentanyl patch to lower back . Patient states that pain was moderately improved by Oxycodone around 9 am.   Past Medical History  Diagnosis Date  . Sickle cell disease   . Sickle cell anemia   . Closed TBI (traumatic brain injury) m-3  . Fracture of femur m-2    Multiple femoral fractures of RLE. S/P repair     Review of Systems  Constitutional: Negative.   HENT: Negative.   Eyes: Negative.   Respiratory: Negative.   Cardiovascular: Negative.   Gastrointestinal: Negative.   Endocrine: Negative.   Genitourinary: Negative.   Musculoskeletal: Positive for back pain, myalgias and neck pain.  Skin: Negative.   Allergic/Immunologic: Negative.   Neurological: Negative.   Hematological: Negative.   Psychiatric/Behavioral: Negative.        Objective:   Physical Exam  Constitutional: He is oriented to person, place, and time. He appears well-developed and well-nourished.  HENT:  Head: Normocephalic and atraumatic.  Right Ear: External ear normal.  Left Ear: External ear normal.  Mouth/Throat: Oropharynx is clear and moist.  Neck: Normal range of motion. Neck supple.  Cardiovascular: Normal rate, regular rhythm and normal heart sounds.   Pulmonary/Chest: Effort  normal and breath sounds normal.  Abdominal: Soft. Bowel sounds are normal.  Musculoskeletal: Normal range of motion.  Neurological: He is alert and oriented to person, place, and time. He has normal reflexes.  Skin: Skin is warm and dry.  Psychiatric: He has a normal mood and affect. His behavior is normal. Judgment and thought content normal.      BP 125/83  Pulse 77  Temp(Src) 98.3 F (36.8 C) (Oral)  Resp 14  Ht 6\' 1"  (1.854 m)  Wt 149 lb (67.586 kg)  BMI 19.66 kg/m2    Assessment & Plan:  1.  Hb-SS disease without crisis Start Hydrea 500 mg daily. We discussed the need for good hydration, monitoring of hydration status, avoidance of heat, cold, stress, and infection triggers. We discussed the risks and benefits of Hydrea, including bone marrow suppression, the possibility of GI upset, skin ulcers, hair thinning, and teratogenicity. The patient was reminded of the need to seek medical attention of any symptoms of bleeding, anemia, or infection. Continue folic acid 1 mg daily to prevent aplastic bone marrow crises.  Eye - High risk of proliferative retinopathy. Annual eye exam with retinal exam recommended to patient. Will send referral  Immunization status - Yearly influenza vaccination is recommended today  - Ambulatory referral to Ophthalmology - CBC with Differential - COMPLETE METABOLIC PANEL WITH GFR - hydroxyurea (HYDREA) 500 MG capsule; Take 1 capsule (500 mg total) by mouth daily. May take with food to minimize GI side effects.  Dispense: 60 capsule; Refill: 2  2. Vitamin D  deficiency Encouraged to continue Drisdol 50,000 units weekly - Vitamin D, 25-hydroxy  3. Tobacco abuse Patient states that he smokes 2-3 cigarettes daily which has decreased over the past few months. Patient states that he is making an effort to quit.   4. Chronic pain Acute and chronic painful episodes - We agreed on plan on titrating his medication dose during summer months. He is also aware  that his prescription history is available to [place] online through the [place]. Controlled substance agreement previously signed and dated. We reminded Mr. Hoglund that all patients receiving Schedule II narcotics must be seen for follow up every three months. We reviewed the terms of our pain agreement, including the need to keep medicines in a safe locked location away from children or pets, and the need to report excess sedation or constipation, measures to avoid constipation, and policies related to early refills and stolen prescriptions. According to the [place] [facility] program, we have reviewed details related to analgesia, adverse effects, aberrant behaviors.   5. Need for immunization against influenza - Flu Vaccine QUAD 36+ mos IM (Fluarix)  Ladonne Sharples M, FNP

## 2014-08-31 LAB — VITAMIN D 25 HYDROXY (VIT D DEFICIENCY, FRACTURES): Vit D, 25-Hydroxy: 39 ng/mL (ref 30–89)

## 2014-09-08 ENCOUNTER — Telehealth: Payer: Self-pay | Admitting: Internal Medicine

## 2014-09-08 DIAGNOSIS — D571 Sickle-cell disease without crisis: Secondary | ICD-10-CM

## 2014-09-08 NOTE — Telephone Encounter (Signed)
Refill request for Fentanyl 29mcg patch. LOV 08/30/2014 Please advise. Thanks!

## 2014-09-10 MED ORDER — FENTANYL 25 MCG/HR TD PT72: 25.0000 ug | MEDICATED_PATCH | TRANSDERMAL | Status: DC

## 2014-09-10 NOTE — Telephone Encounter (Signed)
Meds ordered this encounter  Medications  . fentaNYL (DURAGESIC - DOSED MCG/HR) 25 MCG/HR patch    Sig: Place 1 patch (25 mcg total) onto the skin every 3 (three) days.    Dispense:  10 patch    Refill:  0    Order Specific Question:  Supervising Provider    Answer:  Liston Alba A [[id number]]  Farron Watrous M, FNP Reviewed [place] Substance Reporting system prior to reorder

## 2014-09-13 ENCOUNTER — Telehealth: Payer: Self-pay | Admitting: Internal Medicine

## 2014-09-13 DIAGNOSIS — D5700 Hb-SS disease with crisis, unspecified: Secondary | ICD-10-CM

## 2014-09-13 MED ORDER — OXYCODONE HCL 5 MG PO TABS: 5.0000 mg | ORAL_TABLET | ORAL | Status: DC | PRN

## 2014-09-13 NOTE — Telephone Encounter (Signed)
Meds ordered this encounter  Medications  . oxyCODONE (OXY IR/ROXICODONE) 5 MG immediate release tablet    Sig: Take 1 tablet (5 mg total) by mouth every 4 (four) hours as needed for severe pain.    Dispense:  90 tablet    Refill:  0    Order Specific Question:  Supervising Provider    Answer:  MATTHEWS, MICHELLE A [[id number]]  Reviewed [place] Substance Reporting system prior to reorder Yasmine Kilbourne M, FNP 

## 2014-09-13 NOTE — Telephone Encounter (Signed)
Refill Request for Oxycodone 5mg  LOV 08/30/2014. Please advise. Thanks!

## 2014-09-27 ENCOUNTER — Telehealth: Payer: Self-pay | Admitting: Internal Medicine

## 2014-09-27 DIAGNOSIS — D5700 Hb-SS disease with crisis, unspecified: Secondary | ICD-10-CM

## 2014-09-27 MED ORDER — OXYCODONE HCL 5 MG PO TABS: 5.0000 mg | ORAL_TABLET | ORAL | Status: DC | PRN

## 2014-09-27 NOTE — Telephone Encounter (Signed)
Refill request for oxycodone 5mg LOV 08/30/2014. Please advise. Thanks! 

## 2014-09-27 NOTE — Telephone Encounter (Signed)
Meds ordered this encounter  Medications  . oxyCODONE (OXY IR/ROXICODONE) 5 MG immediate release tablet    Sig: Take 1 tablet (5 mg total) by mouth every 4 (four) hours as needed for severe pain.    Dispense:  90 tablet    Refill:  0  Floride Hutmacher M, FNP  Reviewed [place] Substance Reporting system prior to reorder

## 2014-10-04 ENCOUNTER — Other Ambulatory Visit: Payer: Self-pay | Admitting: Internal Medicine

## 2014-10-04 DIAGNOSIS — D571 Sickle-cell disease without crisis: Secondary | ICD-10-CM

## 2014-10-04 NOTE — Telephone Encounter (Signed)
Refill request for fentaNYL (DURAGESIC - DOSED MCG/HR) 25 MCG/HR patch / LOV 08-30-2014

## 2014-10-06 MED ORDER — FENTANYL 25 MCG/HR TD PT72: 25.0000 ug | MEDICATED_PATCH | TRANSDERMAL | Status: DC

## 2014-10-06 NOTE — Telephone Encounter (Signed)
Prescription re-ordered for Duragesic transdermal 25 mcg/hr #10 patches.

## 2014-10-11 ENCOUNTER — Telehealth: Payer: Self-pay | Admitting: Internal Medicine

## 2014-10-11 DIAGNOSIS — D5700 Hb-SS disease with crisis, unspecified: Secondary | ICD-10-CM

## 2014-10-11 MED ORDER — OXYCODONE HCL 5 MG PO TABS: 5.0000 mg | ORAL_TABLET | ORAL | Status: DC | PRN

## 2014-10-11 NOTE — Telephone Encounter (Signed)
Refill request for oxycodone 5mg . Patient last saw [place] on 08/30/2014. Please advise. Thanks!

## 2014-10-11 NOTE — Telephone Encounter (Signed)
Prescription re-written for Oxycodone 5 mg #90 tabs 

## 2014-10-21 ENCOUNTER — Telehealth: Payer: Self-pay | Admitting: Internal Medicine

## 2014-10-21 DIAGNOSIS — D5700 Hb-SS disease with crisis, unspecified: Secondary | ICD-10-CM

## 2014-10-21 MED ORDER — OXYCODONE HCL 5 MG PO TABS: 5.0000 mg | ORAL_TABLET | ORAL | Status: DC | PRN

## 2014-10-21 NOTE — Telephone Encounter (Signed)
Refill request for oxycodone 5mg LOV 08/30/2014. Please advise. Thanks! 

## 2014-10-21 NOTE — Telephone Encounter (Signed)
Meds ordered this encounter  Medications  . oxyCODONE (OXY IR/ROXICODONE) 5 MG immediate release tablet    Sig: Take 1 tablet (5 mg total) by mouth every 4 (four) hours as needed for severe pain.    Dispense:  90 tablet    Refill:  0    Rx not to be filled prior to 10/26/2014    Order Specific Question:  Supervising Provider    Answer:  Liston Alba A [[id number]]  Reviewed [place] Substance Reporting system prior to reorder Dorena Dew, FNP

## 2014-11-09 ENCOUNTER — Telehealth: Payer: Self-pay | Admitting: Internal Medicine

## 2014-11-09 DIAGNOSIS — D571 Sickle-cell disease without crisis: Secondary | ICD-10-CM

## 2014-11-09 DIAGNOSIS — D5700 Hb-SS disease with crisis, unspecified: Secondary | ICD-10-CM

## 2014-11-09 MED ORDER — OXYCODONE HCL 5 MG PO TABS: 5.0000 mg | ORAL_TABLET | ORAL | Status: DC | PRN

## 2014-11-09 MED ORDER — FENTANYL 25 MCG/HR TD PT72: 25.0000 ug | MEDICATED_PATCH | TRANSDERMAL | Status: DC

## 2014-11-09 NOTE — Telephone Encounter (Signed)
Refill request for Fentanyl 72mcg, and oxycodone 5mg . LOV  08/30/2014. Please advise. Thanks!

## 2014-11-09 NOTE — Telephone Encounter (Signed)
Meds ordered this encounter  Medications  . fentaNYL (DURAGESIC - DOSED MCG/HR) 25 MCG/HR patch    Sig: Place 1 patch (25 mcg total) onto the skin every 3 (three) days.    Dispense:  10 patch    Refill:  0    Order Specific Question:  Supervising Provider    Answer:  MATTHEWS, MICHELLE A [[id number]]  . oxyCODONE (OXY IR/ROXICODONE) 5 MG immediate release tablet    Sig: Take 1 tablet (5 mg total) by mouth every 4 (four) hours as needed for severe pain.    Dispense:  90 tablet    Refill:  0    Order Specific Question:  Supervising Provider    Answer:  Liston Alba A [[id number]]  Reviewed [place] Substance Reporting system prior to reorder Dorena Dew, FNP

## 2014-11-18 ENCOUNTER — Telehealth: Payer: Self-pay | Admitting: Internal Medicine

## 2014-11-18 DIAGNOSIS — D5700 Hb-SS disease with crisis, unspecified: Secondary | ICD-10-CM

## 2014-11-19 MED ORDER — OXYCODONE HCL 5 MG PO TABS: 5.0000 mg | ORAL_TABLET | ORAL | Status: DC | PRN

## 2014-11-19 NOTE — Telephone Encounter (Signed)
Meds ordered this encounter  Medications  . oxyCODONE (OXY IR/ROXICODONE) 5 MG immediate release tablet    Sig: Take 1 tablet (5 mg total) by mouth every 4 (four) hours as needed for severe pain.    Dispense:  90 tablet    Refill:  0    Order Specific Question:  Supervising Provider    Answer:  Liston Alba A [[id number]]  Reviewed [place] Substance Reporting system prior to reorder Dorena Dew, FNP

## 2014-11-19 NOTE — Telephone Encounter (Signed)
Refill request for oxycodone 5mg  LOV 08/30/2014. Please advise. Thanks!

## 2014-12-02 ENCOUNTER — Telehealth: Payer: Self-pay | Admitting: Internal Medicine

## 2014-12-02 DIAGNOSIS — D5700 Hb-SS disease with crisis, unspecified: Secondary | ICD-10-CM

## 2014-12-02 DIAGNOSIS — D571 Sickle-cell disease without crisis: Secondary | ICD-10-CM

## 2014-12-02 NOTE — Telephone Encounter (Signed)
Refill request for Oxycodone 5mg  and fentanyl 25mg  patch. LOV 08/30/2014. Please advise. Thanks!

## 2014-12-07 MED ORDER — OXYCODONE HCL 5 MG PO TABS: 5.0000 mg | ORAL_TABLET | ORAL | Status: DC | PRN

## 2014-12-07 MED ORDER — FENTANYL 25 MCG/HR TD PT72: 25.0000 ug | MEDICATED_PATCH | TRANSDERMAL | Status: DC

## 2014-12-07 NOTE — Telephone Encounter (Signed)
Meds ordered this encounter  Medications  . fentaNYL (DURAGESIC - DOSED MCG/HR) 25 MCG/HR patch    Sig: Place 1 patch (25 mcg total) onto the skin every 3 (three) days.    Dispense:  10 patch    Refill:  0    Not to be filled prior to 11/10/2015    Order Specific Question:  Supervising Provider    Answer:  Liston Alba A [[id number]]  . oxyCODONE (OXY IR/ROXICODONE) 5 MG immediate release tablet    Sig: Take 1 tablet (5 mg total) by mouth every 4 (four) hours as needed for severe pain.    Dispense:  90 tablet    Refill:  0    Order Specific Question:  Supervising Provider    Answer:  Liston Alba A [[id number]]  Reviewed [place] Substance Reporting system prior to reorder Dorena Dew, FNP

## 2014-12-17 ENCOUNTER — Telehealth: Payer: Self-pay | Admitting: Internal Medicine

## 2014-12-17 DIAGNOSIS — D5700 Hb-SS disease with crisis, unspecified: Secondary | ICD-10-CM

## 2014-12-17 NOTE — Telephone Encounter (Signed)
Refill request for Oxycodone 5mg . LOV 08/30/2014. Please advise. Thanks!

## 2014-12-21 MED ORDER — OXYCODONE HCL 5 MG PO TABS: 5.0000 mg | ORAL_TABLET | ORAL | Status: DC | PRN

## 2014-12-21 NOTE — Telephone Encounter (Signed)
Meds ordered this encounter  Medications  . oxyCODONE (OXY IR/ROXICODONE) 5 MG immediate release tablet    Sig: Take 1 tablet (5 mg total) by mouth every 4 (four) hours as needed for severe pain.    Dispense:  90 tablet    Refill:  0    Order Specific Question:  Supervising Provider    Answer:  Liston Alba A [[id number]]  Reviewed [place] Substance Reporting system prior to reorder Dorena Dew, FNP

## 2015-01-03 ENCOUNTER — Telehealth: Payer: Self-pay | Admitting: Internal Medicine

## 2015-01-03 DIAGNOSIS — D5700 Hb-SS disease with crisis, unspecified: Secondary | ICD-10-CM

## 2015-01-03 DIAGNOSIS — G8929 Other chronic pain: Secondary | ICD-10-CM

## 2015-01-03 MED ORDER — OXYCODONE HCL 5 MG PO TABS: 5.0000 mg | ORAL_TABLET | ORAL | Status: DC | PRN

## 2015-01-03 NOTE — Telephone Encounter (Signed)
Refill request for oxycodone 5mg . LOV 08/30/2014. Please advise. Thanks!

## 2015-01-03 NOTE — Telephone Encounter (Signed)
Meds ordered this encounter  Medications  . oxyCODONE (OXY IR/ROXICODONE) 5 MG immediate release tablet    Sig: Take 1 tablet (5 mg total) by mouth every 4 (four) hours as needed for severe pain.    Dispense:  90 tablet    Refill:  0    Order Specific Question:  Supervising Provider    Answer:  Liston Alba A [[id number]]  Reviewed [place] Substance Reporting system prior to reorder

## 2015-01-10 ENCOUNTER — Telehealth: Payer: Self-pay | Admitting: Internal Medicine

## 2015-01-10 DIAGNOSIS — D571 Sickle-cell disease without crisis: Secondary | ICD-10-CM

## 2015-01-10 MED ORDER — FENTANYL 25 MCG/HR TD PT72: 25.0000 ug | MEDICATED_PATCH | TRANSDERMAL | Status: DC

## 2015-01-10 NOTE — Telephone Encounter (Signed)
Refill request for fentanyl 81mcg patch. LOV 08/30/2014. Please advise. Thanks!

## 2015-01-10 NOTE — Telephone Encounter (Signed)
Meds ordered this encounter  Medications  . fentaNYL (DURAGESIC - DOSED MCG/HR) 25 MCG/HR patch    Sig: Place 1 patch (25 mcg total) onto the skin every 3 (three) days.    Dispense:  10 patch    Refill:  0    Order Specific Question:  Supervising Provider    Answer:  Liston Alba A [[id number]]   Reviewed [place] Substance Reporting system prior to reorder   Dorena Dew, FNP

## 2015-01-18 ENCOUNTER — Telehealth: Payer: Self-pay | Admitting: Internal Medicine

## 2015-01-18 NOTE — Telephone Encounter (Signed)
Refill request for oxycodone 5mg . LOV 08/30/2014. Please advise. Thanks!

## 2015-01-19 ENCOUNTER — Ambulatory Visit (INDEPENDENT_AMBULATORY_CARE_PROVIDER_SITE_OTHER): Payer: Medicaid Other | Admitting: Family Medicine

## 2015-01-19 VITALS — BP 109/72 | HR 88 | Temp 98.4°F | Resp 16 | Ht 73.0 in | Wt 141.0 lb

## 2015-01-19 DIAGNOSIS — G8929 Other chronic pain: Secondary | ICD-10-CM | POA: Diagnosis not present

## 2015-01-19 DIAGNOSIS — D5700 Hb-SS disease with crisis, unspecified: Secondary | ICD-10-CM

## 2015-01-19 DIAGNOSIS — D571 Sickle-cell disease without crisis: Secondary | ICD-10-CM

## 2015-01-19 DIAGNOSIS — Z23 Encounter for immunization: Secondary | ICD-10-CM | POA: Diagnosis not present

## 2015-01-19 LAB — CBC WITH DIFFERENTIAL/PLATELET
BASOS ABS: 0 10*3/uL (ref 0.0–0.1)
BASOS PCT: 0 % (ref 0–1)
Eosinophils Absolute: 0 10*3/uL (ref 0.0–0.7)
Eosinophils Relative: 0 % (ref 0–5)
HCT: 42.3 % (ref 39.0–52.0)
HEMOGLOBIN: 14.2 g/dL (ref 13.0–17.0)
Lymphocytes Relative: 22 % (ref 12–46)
Lymphs Abs: 1.7 10*3/uL (ref 0.7–4.0)
MCH: 25 pg — AB (ref 26.0–34.0)
MCHC: 33.6 g/dL (ref 30.0–36.0)
MCV: 74.6 fL — AB (ref 78.0–100.0)
MONO ABS: 0.4 10*3/uL (ref 0.1–1.0)
MPV: 9.6 fL (ref 8.6–12.4)
Monocytes Relative: 5 % (ref 3–12)
NEUTROS ABS: 5.5 10*3/uL (ref 1.7–7.7)
Neutrophils Relative %: 73 % (ref 43–77)
Platelets: 264 10*3/uL (ref 150–400)
RBC: 5.67 MIL/uL (ref 4.22–5.81)
RDW: 16.6 % — ABNORMAL HIGH (ref 11.5–15.5)
WBC: 7.5 10*3/uL (ref 4.0–10.5)

## 2015-01-19 LAB — COMPLETE METABOLIC PANEL WITH GFR
ALBUMIN: 4.4 g/dL (ref 3.5–5.2)
ALT: 10 U/L (ref 0–53)
AST: 11 U/L (ref 0–37)
Alkaline Phosphatase: 89 U/L (ref 39–117)
BILIRUBIN TOTAL: 0.4 mg/dL (ref 0.2–1.2)
BUN: 12 mg/dL (ref 6–23)
CALCIUM: 9.3 mg/dL (ref 8.4–10.5)
CHLORIDE: 102 meq/L (ref 96–112)
CO2: 24 mEq/L (ref 19–32)
CREATININE: 0.99 mg/dL (ref 0.50–1.35)
GFR, Est African American: >89 mL/min
GLUCOSE: 79 mg/dL (ref 70–99)
Potassium: 3.8 mEq/L (ref 3.5–5.3)
Sodium: 138 mEq/L (ref 135–145)
TOTAL PROTEIN: 7.6 g/dL (ref 6.0–8.3)

## 2015-01-19 MED ORDER — OXYCODONE HCL 5 MG PO TABS: 5.0000 mg | ORAL_TABLET | ORAL | Status: DC | PRN

## 2015-01-19 MED ORDER — MELOXICAM 7.5 MG PO TABS: 7.5000 mg | ORAL_TABLET | Freq: Every day | ORAL | Status: DC

## 2015-01-19 MED ORDER — FOLIC ACID 1 MG PO TABS: 1.0000 mg | ORAL_TABLET | Freq: Every day | ORAL | Status: DC
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
## 2015-01-20 LAB — URINALYSIS, COMPLETE
Bacteria, UA: NONE SEEN
Bilirubin Urine: NEGATIVE
Casts: NONE SEEN
Crystals: NONE SEEN
GLUCOSE, UA: NEGATIVE mg/dL
HGB URINE DIPSTICK: NEGATIVE
Leukocytes, UA: NEGATIVE
Nitrite: NEGATIVE
PROTEIN: NEGATIVE mg/dL
Specific Gravity, Urine: 1.022 (ref 1.005–1.030)
Squamous Epithelial / LPF: NONE SEEN
Urobilinogen, UA: 0.2 mg/dL (ref 0.0–1.0)
pH: 5 (ref 5.0–8.0)

## 2015-01-20 LAB — VITAMIN D 25 HYDROXY (VIT D DEFICIENCY, FRACTURES): VIT D 25 HYDROXY: 16 ng/mL — AB (ref 30–100)

## 2015-01-21 ENCOUNTER — Encounter: Payer: Self-pay | Admitting: Family Medicine

## 2015-01-21 NOTE — Patient Instructions (Addendum)
Sickle Cell Anemia, Adult Sickle cell anemia is a condition in which red blood cells have an abnormal "sickle" shape. This abnormal shape shortens the cells' life span, which results in a lower than normal concentration of red blood cells in the blood. The sickle shape also causes the cells to clump together and block free blood flow through the blood vessels. As a result, the tissues and organs of the body do not receive enough oxygen. Sickle cell anemia causes organ damage and pain and increases the risk of infection. CAUSES  Sickle cell anemia is a genetic disorder. Those who receive two copies of the gene have the condition, and those who receive one copy have the trait. RISK FACTORS The sickle cell gene is most common in people whose families originated in [place]. Other areas of the globe where sickle cell trait occurs include the Mediterranean, [place] and [place], the [place], and the [place].  SIGNS AND SYMPTOMS  Pain, especially in the extremities, back, chest, or abdomen (common). The pain may start suddenly or may develop following an illness, especially if there is dehydration. Pain can also occur due to overexertion or exposure to extreme temperature changes.  Frequent severe bacterial infections, especially certain types of pneumonia and meningitis.  Pain and swelling in the hands and feet.  Decreased activity.   Loss of appetite.   Change in behavior.  Headaches.  Seizures.  Shortness of breath or difficulty breathing.  Vision changes.  Skin ulcers. Those with the trait may not have symptoms or they may have mild symptoms.  DIAGNOSIS  Sickle cell anemia is diagnosed with blood tests that demonstrate the genetic trait. It is often diagnosed during the newborn period, due to mandatory testing nationwide. A variety of blood tests, X-rays, CT scans, MRI scans, ultrasounds, and lung function tests may also be done to monitor the condition. TREATMENT  Sickle  cell anemia may be treated with:  Medicines. You may be given pain medicines, antibiotic medicines (to treat and prevent infections) or medicines to increase the production of certain types of hemoglobin.  Fluids.  Oxygen.  Blood transfusions. HOME CARE INSTRUCTIONS   Drink enough fluid to keep your urine clear or pale yellow. Increase your fluid intake in hot weather and during exercise.  Do not smoke. Smoking lowers oxygen levels in the blood.   Only take over-the-counter or prescription medicines for pain, fever, or discomfort as directed by your health care provider.  Take antibiotics as directed by your health care provider. Make sure you finish them it even if you start to feel better.   Take supplements as directed by your health care provider.   Consider wearing a medical alert bracelet. This tells anyone caring for you in an emergency of your condition.   When traveling, keep your medical information, health care provider's names, and the medicines you take with you at all times.   If you develop a fever, do not take medicines to reduce the fever right away. This could cover up a problem that is developing. Notify your health care provider.  Keep all follow-up appointments with your health care provider. Sickle cell anemia requires regular medical care. SEEK MEDICAL CARE IF: You have a fever. SEEK IMMEDIATE MEDICAL CARE IF:   You feel dizzy or faint.   You have new abdominal pain, especially on the left side near the stomach area.   You develop a persistent, often uncomfortable and painful penile erection (priapism). If this is not treated immediately it   will lead to impotence.   You have numbness your arms or legs or you have a hard time moving them.   You have a hard time with speech.   You have a fever or persistent symptoms for more than 2-3 days.   You have a fever and your symptoms suddenly get worse.   You have signs or symptoms of infection.  These include:   Chills.   Abnormal tiredness (lethargy).   Irritability.   Poor eating.   Vomiting.   You develop pain that is not helped with medicine.   You develop shortness of breath.  You have pain in your chest.   You are coughing up pus-like or bloody sputum.   You develop a stiff neck.  Your feet or hands swell or have pain.  Your abdomen appears bloated.  You develop joint pain. MAKE SURE YOU:  Understand these instructions. Document Released: 02/06/2006 Document Revised: 03/15/2014 Document Reviewed: 06/10/2013 [facility] Patient Information 2015 [place], [place]. This information is not intended to replace advice given to you by your health care provider. Make sure you discuss any questions you have with your health care provider. Sickle Cell Anemia, Adult Sickle cell anemia is a condition in which red blood cells have an abnormal "sickle" shape. This abnormal shape shortens the cells' life span, which results in a lower than normal concentration of red blood cells in the blood. The sickle shape also causes the cells to clump together and block free blood flow through the blood vessels. As a result, the tissues and organs of the body do not receive enough oxygen. Sickle cell anemia causes organ damage and pain and increases the risk of infection. CAUSES  Sickle cell anemia is a genetic disorder. Those who receive two copies of the gene have the condition, and those who receive one copy have the trait. RISK FACTORS The sickle cell gene is most common in people whose families originated in [place]. Other areas of the globe where sickle cell trait occurs include the [place], [place] and [place], and the [place].  SIGNS AND SYMPTOMS  Pain, especially in the extremities, back, chest, or abdomen (common). The pain may start suddenly or may develop following an illness, especially if there is dehydration. Pain can also occur due to  overexertion or exposure to extreme temperature changes.  Frequent severe bacterial infections, especially certain types of pneumonia and meningitis.  Pain and swelling in the hands and feet.  Decreased activity.   Loss of appetite.   Change in behavior.  Headaches.  Seizures.  Shortness of breath or difficulty breathing.  Vision changes.  Skin ulcers. Those with the trait may not have symptoms or they may have mild symptoms.  DIAGNOSIS  Sickle cell anemia is diagnosed with blood tests that demonstrate the genetic trait. It is often diagnosed during the newborn period, due to mandatory testing nationwide. A variety of blood tests, X-rays, CT scans, MRI scans, ultrasounds, and lung function tests may also be done to monitor the condition. TREATMENT  Sickle cell anemia may be treated with:  Medicines. You may be given pain medicines, antibiotic medicines (to treat and prevent infections) or medicines to increase the production of certain types of hemoglobin.  Fluids.  Oxygen.  Blood transfusions. HOME CARE INSTRUCTIONS   Drink enough fluid to keep your urine clear or pale yellow. Increase your fluid intake in hot weather and during exercise.  Do not smoke. Smoking lowers oxygen levels in the blood.  Only take over-the-counter or prescription medicines for pain, fever, or discomfort as directed by your health care provider.  Take antibiotics as directed by your health care provider. Make sure you finish them it even if you start to feel better.   Take supplements as directed by your health care provider.   Consider wearing a medical alert bracelet. This tells anyone caring for you in an emergency of your condition.   When traveling, keep your medical information, health care provider's names, and the medicines you take with you at all times.   If you develop a fever, do not take medicines to reduce the fever right away. This could cover up a problem that is  developing. Notify your health care provider.  Keep all follow-up appointments with your health care provider. Sickle cell anemia requires regular medical care. SEEK MEDICAL CARE IF: You have a fever. SEEK IMMEDIATE MEDICAL CARE IF:   You feel dizzy or faint.   You have new abdominal pain, especially on the left side near the stomach area.   You develop a persistent, often uncomfortable and painful penile erection (priapism). If this is not treated immediately it will lead to impotence.   You have numbness your arms or legs or you have a hard time moving them.   You have a hard time with speech.   You have a fever or persistent symptoms for more than 2-3 days.   You have a fever and your symptoms suddenly get worse.   You have signs or symptoms of infection. These include:   Chills.   Abnormal tiredness (lethargy).   Irritability.   Poor eating.   Vomiting.   You develop pain that is not helped with medicine.   You develop shortness of breath.  You have pain in your chest.   You are coughing up pus-like or bloody sputum.   You develop a stiff neck.  Your feet or hands swell or have pain.  Your abdomen appears bloated.  You develop joint pain. MAKE SURE YOU:  Understand these instructions. Document Released: 02/06/2006 Document Revised: 03/15/2014 Document Reviewed: 06/10/2013 [facility] Patient Information 2015 [place], [place]. This information is not intended to replace advice given to you by your health care provider. Make sure you discuss any questions you have with your health care provider.

## 2015-01-24 LAB — OXYCODONE, URINE (LC/MS-MS)
Noroxycodone, Ur: 161 ng/mL — AB (ref <50)
OXYCODONE, UR: 55 ng/mL — AB (ref <50)
Oxymorphone: 194 ng/mL — AB (ref <50)

## 2015-01-24 LAB — CANNABANOIDS (GC/LC/MS), URINE: THC-COOH (GC/LC/MS), ur confirm: 158 ng/mL — AB (ref <5)

## 2015-01-25 LAB — PRESCRIPTION MONITORING PROFILE (SOLSTAS)
Amphetamine/Meth: NEGATIVE ng/mL
Barbiturate Screen, Urine: NEGATIVE ng/mL
Benzodiazepine Screen, Urine: NEGATIVE ng/mL
Buprenorphine, Urine: NEGATIVE ng/mL
CREATININE, URINE: 238.53 mg/dL (ref >20.0)
Carisoprodol, Urine: NEGATIVE ng/mL
Cocaine Metabolites: NEGATIVE ng/mL
Fentanyl, Ur: NEGATIVE ng/mL
MDMA URINE: NEGATIVE ng/mL
METHADONE SCREEN, URINE: NEGATIVE ng/mL
Meperidine, Ur: NEGATIVE ng/mL
NITRITES URINE, INITIAL: NEGATIVE ug/mL
Opiate Screen, Urine: NEGATIVE ng/mL
PROPOXYPHENE: NEGATIVE ng/mL
Tapentadol, urine: NEGATIVE ng/mL
Tramadol Scrn, Ur: NEGATIVE ng/mL
Zolpidem, Urine: NEGATIVE ng/mL
pH, Initial: 5.4 pH (ref 4.5–8.9)

## 2015-01-28 ENCOUNTER — Telehealth: Payer: Self-pay | Admitting: Internal Medicine

## 2015-01-28 DIAGNOSIS — D5700 Hb-SS disease with crisis, unspecified: Secondary | ICD-10-CM

## 2015-01-28 NOTE — Telephone Encounter (Signed)
Refill request for oxycodone 5mg . lov 01/19/2015. Please advise. Thanks!

## 2015-02-01 MED ORDER — OXYCODONE HCL 5 MG PO TABS: 5.0000 mg | ORAL_TABLET | ORAL | Status: DC | PRN

## 2015-02-01 NOTE — Telephone Encounter (Signed)
Meds ordered this encounter  Medications  . oxyCODONE (OXY IR/ROXICODONE) 5 MG immediate release tablet    Sig: Take 1 tablet (5 mg total) by mouth every 4 (four) hours as needed for severe pain.    Dispense:  90 tablet    Refill:  0    Order Specific Question:  Supervising Provider    Answer:  Liston Alba A [[id number]]  Reviewed [place] Substance Reporting system prior to reorder Dorena Dew, FNP

## 2015-02-07 ENCOUNTER — Telehealth: Payer: Self-pay | Admitting: Internal Medicine

## 2015-02-07 DIAGNOSIS — D571 Sickle-cell disease without crisis: Secondary | ICD-10-CM

## 2015-02-07 NOTE — Telephone Encounter (Signed)
Refill request for fentanyl 22mcg patch LOV 01/19/2015. Please advise. Thanks!

## 2015-02-08 MED ORDER — FENTANYL 25 MCG/HR TD PT72: 25.0000 ug | MEDICATED_PATCH | TRANSDERMAL | Status: DC

## 2015-02-08 NOTE — Telephone Encounter (Signed)
Meds ordered this encounter  Medications  . fentaNYL (DURAGESIC - DOSED MCG/HR) 25 MCG/HR patch    Sig: Place 1 patch (25 mcg total) onto the skin every 3 (three) days.    Dispense:  10 patch    Refill:  0    Order Specific Question:  Supervising Provider    Answer:  Liston Alba A [[id number]]  Reviewed [place] Substance Reporting system prior to reorder Dorena Dew, FNP

## 2015-02-10 ENCOUNTER — Telehealth: Payer: Self-pay | Admitting: Internal Medicine

## 2015-02-10 DIAGNOSIS — D5700 Hb-SS disease with crisis, unspecified: Secondary | ICD-10-CM

## 2015-02-11 MED ORDER — OXYCODONE HCL 5 MG PO TABS: 5.0000 mg | ORAL_TABLET | ORAL | Status: DC | PRN

## 2015-02-11 NOTE — Telephone Encounter (Signed)
Meds ordered this encounter  Medications  . oxyCODONE (OXY IR/ROXICODONE) 5 MG immediate release tablet    Sig: Take 1 tablet (5 mg total) by mouth every 4 (four) hours as needed for severe pain.    Dispense:  90 tablet    Refill:  0    Rx not to be filled prior to 02/16/2015    Order Specific Question:  Supervising Provider    Answer:  Liston Alba A [[id number]]  Reviewed [place] Substance Reporting system prior to reorder   Dorena Dew, FNP

## 2015-02-11 NOTE — Telephone Encounter (Signed)
Refill request for oxycodone 5mg  LOV 01/19/2015. Please advise. Thanks!

## 2015-02-24 ENCOUNTER — Telehealth: Payer: Self-pay | Admitting: Internal Medicine

## 2015-02-24 DIAGNOSIS — D5700 Hb-SS disease with crisis, unspecified: Secondary | ICD-10-CM

## 2015-02-24 MED ORDER — OXYCODONE HCL 5 MG PO TABS: 5.0000 mg | ORAL_TABLET | ORAL | Status: DC | PRN

## 2015-02-24 NOTE — Telephone Encounter (Signed)
Refill request for oxycodone 5mg . LOV 01/19/2015. Please advise. Thanks!

## 2015-02-24 NOTE — Telephone Encounter (Signed)
Meds ordered this encounter  Medications  . oxyCODONE (OXY IR/ROXICODONE) 5 MG immediate release tablet    Sig: Take 1 tablet (5 mg total) by mouth every 4 (four) hours as needed for severe pain.    Dispense:  90 tablet    Refill:  0    Rx not to be filled prior to 03/03/2015    Order Specific Question:  Supervising Provider    Answer:  Liston Alba A [[id number]]  Reviewed [place] Substance Reporting system prior to reorder  Dorena Dew, FNP

## 2015-03-14 ENCOUNTER — Telehealth: Payer: Self-pay | Admitting: Internal Medicine

## 2015-03-14 DIAGNOSIS — D5700 Hb-SS disease with crisis, unspecified: Secondary | ICD-10-CM

## 2015-03-14 DIAGNOSIS — D571 Sickle-cell disease without crisis: Secondary | ICD-10-CM

## 2015-03-14 NOTE — Telephone Encounter (Signed)
Refill request for oxycodone 5mg  and Fentanyl 38mcg. LOV 01/19/2015. Please advise. Thanks!

## 2015-03-17 MED ORDER — FENTANYL 25 MCG/HR TD PT72: 25.0000 ug | MEDICATED_PATCH | TRANSDERMAL | Status: DC

## 2015-03-17 MED ORDER — OXYCODONE HCL 5 MG PO TABS: 5.0000 mg | ORAL_TABLET | ORAL | Status: DC | PRN

## 2015-03-17 NOTE — Telephone Encounter (Signed)
Reviewed daily morphine equivalence (105), James Hamilton will need to schedule a 1 month follow-up for medication management. Reviewed [place] Substance Reporting system prior to reorder, no inconsistencies noted.

## 2015-03-17 NOTE — Telephone Encounter (Signed)
Appointment will be scheduled when patient comes to pick up rx. Thanks!

## 2015-03-28 ENCOUNTER — Telehealth: Payer: Self-pay | Admitting: Internal Medicine

## 2015-03-28 NOTE — Telephone Encounter (Signed)
Mr. Lamm will need to be evaluated prior to prescribing opiate medications. He was last seen in office on 01/19/2015.    Dorena Dew, FNP

## 2015-03-28 NOTE — Telephone Encounter (Signed)
Refill request for oxycodone 5mg . LOV 01/19/15. Please advise. Thanks!

## 2015-03-29 NOTE — Telephone Encounter (Signed)
Patient is scheduled for 04/15/2015. Thanks!

## 2015-04-01 ENCOUNTER — Ambulatory Visit (INDEPENDENT_AMBULATORY_CARE_PROVIDER_SITE_OTHER): Payer: Medicaid Other | Admitting: Family Medicine

## 2015-04-01 ENCOUNTER — Encounter: Payer: Self-pay | Admitting: Family Medicine

## 2015-04-01 VITALS — BP 133/82 | HR 78 | Temp 98.1°F | Resp 16 | Ht 74.0 in | Wt 140.0 lb

## 2015-04-01 DIAGNOSIS — G8929 Other chronic pain: Secondary | ICD-10-CM | POA: Diagnosis not present

## 2015-04-01 DIAGNOSIS — Z720 Tobacco use: Secondary | ICD-10-CM | POA: Diagnosis not present

## 2015-04-01 DIAGNOSIS — D5700 Hb-SS disease with crisis, unspecified: Secondary | ICD-10-CM

## 2015-04-01 DIAGNOSIS — D571 Sickle-cell disease without crisis: Secondary | ICD-10-CM

## 2015-04-01 MED ORDER — FOLIC ACID 1 MG PO TABS: 1.0000 mg | ORAL_TABLET | Freq: Every day | ORAL | Status: DC

## 2015-04-01 MED ORDER — OXYCODONE HCL 5 MG PO TABS: 5.0000 mg | ORAL_TABLET | ORAL | Status: DC | PRN

## 2015-04-01 MED ORDER — HYDROXYUREA 500 MG PO CAPS: 500.0000 mg | ORAL_CAPSULE | Freq: Every day | ORAL | Status: DC

## 2015-04-01 MED ORDER — IBUPROFEN 600 MG PO TABS: 600.0000 mg | ORAL_TABLET | Freq: Three times a day (TID) | ORAL | Status: DC | PRN
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
## 2015-04-01 NOTE — Patient Instructions (Signed)
Chronic Pain Chronic pain can be defined as pain that is off and on and lasts for 3-6 months or longer. Many things cause chronic pain, which can make it difficult to make a diagnosis. There are many treatment options available for chronic pain. However, finding a treatment that works well for you may require trying various approaches until the right one is found. Many people benefit from a combination of two or more types of treatment to control their pain. SYMPTOMS  Chronic pain can occur anywhere in the body and can range from mild to very severe. Some types of chronic pain include:  Headache.  Low back pain.  Cancer pain.  Arthritis pain.  Neurogenic pain. This is pain resulting from damage to nerves. People with chronic pain may also have other symptoms such as:  Depression.  Anger.  Insomnia.  Anxiety. DIAGNOSIS  Your health care provider will help diagnose your condition over time. In many cases, the initial focus will be on excluding possible conditions that could be causing the pain. Depending on your symptoms, your health care provider may order tests to diagnose your condition. Some of these tests may include:   Blood tests.   CT scan.   MRI.   X-rays.   Ultrasounds.   Nerve conduction studies.  You may need to see a specialist.  TREATMENT  Finding treatment that works well may take time. You may be referred to a pain specialist. He or she may prescribe medicine or therapies, such as:   Mindful meditation or yoga.  Shots (injections) of numbing or pain-relieving medicines into the spine or area of pain.  Local electrical stimulation.  Acupuncture.   Massage therapy.   Aroma, color, light, or sound therapy.   Biofeedback.   Working with a physical therapist to keep from getting stiff.   Regular, gentle exercise.   Cognitive or behavioral therapy.   Group support.  Sometimes, surgery may be recommended.  HOME CARE INSTRUCTIONS    Take all medicines as directed by your health care provider.   Lessen stress in your life by relaxing and doing things such as listening to calming music.   Exercise or be active as directed by your health care provider.   Eat a healthy diet and include things such as vegetables, fruits, fish, and lean meats in your diet.   Keep all follow-up appointments with your health care provider.   Attend a support group with others suffering from chronic pain. SEEK MEDICAL CARE IF:   Your pain gets worse.   You develop a new pain that was not there before.   You cannot tolerate medicines given to you by your health care provider.   You have new symptoms since your last visit with your health care provider.  SEEK IMMEDIATE MEDICAL CARE IF:   You feel weak.   You have decreased sensation or numbness.   You lose control of bowel or bladder function.   Your pain suddenly gets much worse.   You develop shaking.  You develop chills.  You develop confusion.  You develop chest pain.  You develop shortness of breath.  MAKE SURE YOU:  Understand these instructions.  Will watch your condition.  Will get help right away if you are not doing well or get worse. Document Released: 07/21/2002 Document Revised: 07/01/2013 Document Reviewed: 04/24/2013 [facility] Patient Information 2015 [place], [place]. This information is not intended to replace advice given to you by your health care provider. Make sure you discuss any  questions you have with your health care provider. Sickle Cell Anemia, Adult Sickle cell anemia is a condition in which red blood cells have an abnormal "sickle" shape. This abnormal shape shortens the cells' life span, which results in a lower than normal concentration of red blood cells in the blood. The sickle shape also causes the cells to clump together and block free blood flow through the blood vessels. As a result, the tissues and organs of the body do  not receive enough oxygen. Sickle cell anemia causes organ damage and pain and increases the risk of infection. CAUSES  Sickle cell anemia is a genetic disorder. Those who receive two copies of the gene have the condition, and those who receive one copy have the trait. RISK FACTORS The sickle cell gene is most common in people whose families originated in [place]. Other areas of the globe where sickle cell trait occurs include the [place], [place] and [place], and the [place].  SIGNS AND SYMPTOMS  Pain, especially in the extremities, back, chest, or abdomen (common). The pain may start suddenly or may develop following an illness, especially if there is dehydration. Pain can also occur due to overexertion or exposure to extreme temperature changes.  Frequent severe bacterial infections, especially certain types of pneumonia and meningitis.  Pain and swelling in the hands and feet.  Decreased activity.   Loss of appetite.   Change in behavior.  Headaches.  Seizures.  Shortness of breath or difficulty breathing.  Vision changes.  Skin ulcers. Those with the trait may not have symptoms or they may have mild symptoms.  DIAGNOSIS  Sickle cell anemia is diagnosed with blood tests that demonstrate the genetic trait. It is often diagnosed during the newborn period, due to mandatory testing nationwide. A variety of blood tests, X-rays, CT scans, MRI scans, ultrasounds, and lung function tests may also be done to monitor the condition. TREATMENT  Sickle cell anemia may be treated with:  Medicines. You may be given pain medicines, antibiotic medicines (to treat and prevent infections) or medicines to increase the production of certain types of hemoglobin.  Fluids.  Oxygen.  Blood transfusions. HOME CARE INSTRUCTIONS   Drink enough fluid to keep your urine clear or pale yellow. Increase your fluid intake in hot weather and during exercise.  Do not  smoke. Smoking lowers oxygen levels in the blood.   Only take over-the-counter or prescription medicines for pain, fever, or discomfort as directed by your health care provider.  Take antibiotics as directed by your health care provider. Make sure you finish them it even if you start to feel better.   Take supplements as directed by your health care provider.   Consider wearing a medical alert bracelet. This tells anyone caring for you in an emergency of your condition.   When traveling, keep your medical information, health care provider's names, and the medicines you take with you at all times.   If you develop a fever, do not take medicines to reduce the fever right away. This could cover up a problem that is developing. Notify your health care provider.  Keep all follow-up appointments with your health care provider. Sickle cell anemia requires regular medical care. SEEK MEDICAL CARE IF: You have a fever. SEEK IMMEDIATE MEDICAL CARE IF:   You feel dizzy or faint.   You have new abdominal pain, especially on the left side near the stomach area.   You develop a persistent, often uncomfortable and painful penile  erection (priapism). If this is not treated immediately it will lead to impotence.   You have numbness your arms or legs or you have a hard time moving them.   You have a hard time with speech.   You have a fever or persistent symptoms for more than 2-3 days.   You have a fever and your symptoms suddenly get worse.   You have signs or symptoms of infection. These include:   Chills.   Abnormal tiredness (lethargy).   Irritability.   Poor eating.   Vomiting.   You develop pain that is not helped with medicine.   You develop shortness of breath.  You have pain in your chest.   You are coughing up pus-like or bloody sputum.   You develop a stiff neck.  Your feet or hands swell or have pain.  Your abdomen appears bloated.  You  develop joint pain. MAKE SURE YOU:  Understand these instructions. Document Released: 02/06/2006 Document Revised: 03/15/2014 Document Reviewed: 06/10/2013 [facility] Patient Information 2015 [place], [place]. This information is not intended to replace advice given to you by your health care provider. Make sure you discuss any questions you have with your health care provider.

## 2015-04-02 LAB — CBC WITH DIFFERENTIAL/PLATELET
BASOS ABS: 0 10*3/uL (ref 0.0–0.1)
Basophils Relative: 0 % (ref 0–1)
Eosinophils Absolute: 0.1 10*3/uL (ref 0.0–0.7)
Eosinophils Relative: 1 % (ref 0–5)
HEMATOCRIT: 42.3 % (ref 39.0–52.0)
HEMOGLOBIN: 14.1 g/dL (ref 13.0–17.0)
Lymphocytes Relative: 23 % (ref 12–46)
Lymphs Abs: 1.8 10*3/uL (ref 0.7–4.0)
MCH: 26.4 pg (ref 26.0–34.0)
MCHC: 33.3 g/dL (ref 30.0–36.0)
MCV: 79.1 fL (ref 78.0–100.0)
MONO ABS: 0.5 10*3/uL (ref 0.1–1.0)
MPV: 10 fL (ref 8.6–12.4)
Monocytes Relative: 6 % (ref 3–12)
NEUTROS ABS: 5.4 10*3/uL (ref 1.7–7.7)
Neutrophils Relative %: 70 % (ref 43–77)
Platelets: 232 10*3/uL (ref 150–400)
RBC: 5.35 MIL/uL (ref 4.22–5.81)
RDW: 20.2 % — AB (ref 11.5–15.5)
WBC: 7.7 10*3/uL (ref 4.0–10.5)

## 2015-04-08 ENCOUNTER — Telehealth: Payer: Self-pay | Admitting: Internal Medicine

## 2015-04-08 DIAGNOSIS — D571 Sickle-cell disease without crisis: Secondary | ICD-10-CM

## 2015-04-12 ENCOUNTER — Telehealth: Payer: Self-pay | Admitting: Internal Medicine

## 2015-04-12 NOTE — Telephone Encounter (Signed)
Refill request for fentanyl 44mcg and oxycodone 5mg . LOV 04/01/2015. Please advise. Thanks!

## 2015-04-12 NOTE — Telephone Encounter (Signed)
Patient called requesting a referral for physical therapy. 

## 2015-04-12 NOTE — Telephone Encounter (Signed)
Patient needs an appointment to be evaluated before a referral is ordered.

## 2015-04-15 ENCOUNTER — Ambulatory Visit: Payer: Medicaid Other | Admitting: Family Medicine

## 2015-04-15 MED ORDER — OXYCODONE HCL 5 MG PO TABS: 5.0000 mg | ORAL_TABLET | ORAL | Status: DC | PRN

## 2015-04-15 MED ORDER — FENTANYL 12 MCG/HR TD PT72: 12.5000 ug | MEDICATED_PATCH | TRANSDERMAL | Status: DC

## 2015-04-15 NOTE — Telephone Encounter (Signed)
James Hamilton, a 29 year old male with a history of sickle cell anemia, HbSS is requesting a refill of opiate medications. We discussed weaning pain medications and referral to pain management during office visit on 04/01/2015. Will continue to wean medications. Reviewed [place] Substance Reporting system prior to reorder, no inconsistencies noted.   Meds ordered this encounter  Medications  . oxyCODONE (OXY IR/ROXICODONE) 5 MG immediate release tablet    Sig: Take 1 tablet (5 mg total) by mouth every 4 (four) hours as needed for severe pain.    Dispense:  90 tablet    Refill:  0    Rx not to be filled prior to 04/17/2015    Order Specific Question:  Supervising Provider    Answer:  Liston Alba A [[id number]]  . fentaNYL (DURAGESIC - DOSED MCG/HR) 12 MCG/HR    Sig: Place 1 patch (12.5 mcg total) onto the skin every 3 (three) days.    Dispense:  10 patch    Refill:  0    Order Specific Question:  Supervising Provider    Answer:  Liston Alba A [[id number]]   Dorena Dew, FNP

## 2015-04-22 ENCOUNTER — Ambulatory Visit: Payer: Medicaid Other | Admitting: Family Medicine

## 2015-05-02 ENCOUNTER — Ambulatory Visit (INDEPENDENT_AMBULATORY_CARE_PROVIDER_SITE_OTHER): Payer: Medicaid Other | Admitting: Family Medicine

## 2015-05-02 ENCOUNTER — Other Ambulatory Visit: Payer: Self-pay

## 2015-05-02 ENCOUNTER — Encounter: Payer: Self-pay | Admitting: Family Medicine

## 2015-05-02 VITALS — BP 120/69 | HR 64 | Temp 98.3°F | Resp 16 | Ht 74.0 in | Wt 143.0 lb

## 2015-05-02 DIAGNOSIS — D571 Sickle-cell disease without crisis: Secondary | ICD-10-CM

## 2015-05-02 DIAGNOSIS — Z23 Encounter for immunization: Secondary | ICD-10-CM | POA: Diagnosis not present

## 2015-05-02 DIAGNOSIS — Z720 Tobacco use: Secondary | ICD-10-CM | POA: Diagnosis not present

## 2015-05-02 DIAGNOSIS — G8929 Other chronic pain: Secondary | ICD-10-CM

## 2015-05-02 DIAGNOSIS — E559 Vitamin D deficiency, unspecified: Secondary | ICD-10-CM

## 2015-05-02 LAB — POCT URINALYSIS DIP (DEVICE)
BILIRUBIN URINE: NEGATIVE
GLUCOSE, UA: NEGATIVE mg/dL
HGB URINE DIPSTICK: NEGATIVE
KETONES UR: NEGATIVE mg/dL
LEUKOCYTES UA: NEGATIVE
Nitrite: NEGATIVE
PH: 6 (ref 5.0–8.0)
Protein, ur: NEGATIVE mg/dL
Specific Gravity, Urine: 1.015 (ref 1.005–1.030)
Urobilinogen, UA: 0.2 mg/dL (ref 0.0–1.0)

## 2015-05-02 MED ORDER — HYDROXYUREA 500 MG PO CAPS: 500.0000 mg | ORAL_CAPSULE | Freq: Two times a day (BID) | ORAL | Status: DC

## 2015-05-02 MED ORDER — OXYCODONE HCL 5 MG PO TABS: 5.0000 mg | ORAL_TABLET | ORAL | Status: DC | PRN

## 2015-05-02 MED ORDER — ERGOCALCIFEROL 1.25 MG (50000 UT) PO CAPS: 50000.0000 [IU] | ORAL_CAPSULE | ORAL | Status: DC

## 2015-05-02 MED ORDER — IBUPROFEN 600 MG PO TABS: 600.0000 mg | ORAL_TABLET | Freq: Three times a day (TID) | ORAL | Status: DC | PRN

## 2015-05-02 NOTE — Patient Instructions (Signed)
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
## 2015-05-10 ENCOUNTER — Telehealth: Payer: Self-pay | Admitting: Family Medicine

## 2015-05-10 ENCOUNTER — Other Ambulatory Visit: Payer: Self-pay | Admitting: Family Medicine

## 2015-05-10 DIAGNOSIS — D571 Sickle-cell disease without crisis: Secondary | ICD-10-CM

## 2015-05-10 MED ORDER — OXYCODONE HCL 5 MG PO TABS: 5.0000 mg | ORAL_TABLET | ORAL | Status: DC | PRN

## 2015-05-10 NOTE — Telephone Encounter (Signed)
Refill request for oxycodone 5mg , and  Fentanyl patch 43mcg. LOV  05/02/2015. Please advise. Thanks!

## 2015-05-11 ENCOUNTER — Ambulatory Visit: Payer: No Typology Code available for payment source | Admitting: Physical Therapy

## 2015-05-23 ENCOUNTER — Ambulatory Visit: Payer: Medicaid Other | Attending: Sports Medicine | Admitting: Rehabilitation

## 2015-05-23 DIAGNOSIS — R269 Unspecified abnormalities of gait and mobility: Secondary | ICD-10-CM | POA: Insufficient documentation

## 2015-05-23 DIAGNOSIS — M2560 Stiffness of unspecified joint, not elsewhere classified: Secondary | ICD-10-CM | POA: Diagnosis not present

## 2015-05-23 DIAGNOSIS — M545 Low back pain, unspecified: Secondary | ICD-10-CM

## 2015-05-23 DIAGNOSIS — M5386 Other specified dorsopathies, lumbar region: Secondary | ICD-10-CM
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
## 2015-05-26 ENCOUNTER — Telehealth: Payer: Self-pay

## 2015-05-26 NOTE — Telephone Encounter (Signed)
Refill request for Oxycodone 5mg  and Fentanyl Patches. LOV 05/02/2015. Please advise. Thanks!

## 2015-05-27 ENCOUNTER — Ambulatory Visit: Payer: Medicaid Other | Admitting: Physical Therapy

## 2015-05-27 ENCOUNTER — Encounter: Payer: Self-pay | Admitting: Physical Therapy

## 2015-05-27 ENCOUNTER — Other Ambulatory Visit: Payer: Self-pay | Admitting: Family Medicine

## 2015-05-27 DIAGNOSIS — M545 Low back pain, unspecified: Secondary | ICD-10-CM

## 2015-05-27 DIAGNOSIS — M5386 Other specified dorsopathies, lumbar region: Secondary | ICD-10-CM

## 2015-05-27 DIAGNOSIS — D571 Sickle-cell disease without crisis: Secondary | ICD-10-CM

## 2015-05-27 MED ORDER — OXYCODONE HCL 5 MG PO TABS: 5.0000 mg | ORAL_TABLET | ORAL | Status: DC | PRN
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
## 2015-05-30 ENCOUNTER — Encounter: Payer: Self-pay | Admitting: Physical Therapy

## 2015-05-30 ENCOUNTER — Ambulatory Visit: Payer: Medicaid Other | Admitting: Physical Therapy

## 2015-05-30 DIAGNOSIS — M545 Low back pain, unspecified: Secondary | ICD-10-CM

## 2015-05-30 DIAGNOSIS — M5386 Other specified dorsopathies, lumbar region: Secondary | ICD-10-CM
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
## 2015-05-31 ENCOUNTER — Other Ambulatory Visit: Payer: Self-pay | Admitting: Family Medicine

## 2015-05-31 DIAGNOSIS — D571 Sickle-cell disease without crisis: Secondary | ICD-10-CM

## 2015-05-31 MED ORDER — FENTANYL 12 MCG/HR TD PT72: 12.5000 ug | MEDICATED_PATCH | TRANSDERMAL | Status: DC

## 2015-06-01 ENCOUNTER — Encounter: Payer: Self-pay | Admitting: Family Medicine

## 2015-06-01 ENCOUNTER — Ambulatory Visit (INDEPENDENT_AMBULATORY_CARE_PROVIDER_SITE_OTHER): Payer: Medicaid Other | Admitting: Family Medicine

## 2015-06-01 VITALS — BP 125/64 | HR 91 | Temp 98.0°F | Resp 16 | Ht 74.0 in | Wt 134.0 lb

## 2015-06-01 DIAGNOSIS — R634 Abnormal weight loss: Secondary | ICD-10-CM

## 2015-06-01 LAB — COMPLETE METABOLIC PANEL WITH GFR
ALT: <8 U/L (ref 0–53)
AST: 12 U/L (ref 0–37)
Albumin: 4.3 g/dL (ref 3.5–5.2)
Alkaline Phosphatase: 89 U/L (ref 39–117)
BILIRUBIN TOTAL: 0.5 mg/dL (ref 0.2–1.2)
BUN: 10 mg/dL (ref 6–23)
CALCIUM: 9.2 mg/dL (ref 8.4–10.5)
CO2: 25 mEq/L (ref 19–32)
Chloride: 103 mEq/L (ref 96–112)
Creat: 0.9 mg/dL (ref 0.50–1.35)
GFR, Est Non African American: >89 mL/min
GLUCOSE: 77 mg/dL (ref 70–99)
Potassium: 4.3 mEq/L (ref 3.5–5.3)
Sodium: 139 mEq/L (ref 135–145)
TOTAL PROTEIN: 7.8 g/dL (ref 6.0–8.3)

## 2015-06-01 LAB — TSH: TSH: 0.614 u[IU]/mL (ref 0.350–4.500)

## 2015-06-01 LAB — HIV ANTIBODY (ROUTINE TESTING W REFLEX): HIV 1&2 Ab, 4th Generation: NONREACTIVE

## 2015-06-01 NOTE — Patient Instructions (Signed)
Focus on eating well Use ensure or someother supplement to maintain weight We will let you know if anything shows up in labs. Continue SCD, chronic pain meds at current dosage Remember medication contract. You can only get narcotics here. Keep in a safe locked place. Use only as directed.

## 2015-06-01 NOTE — Progress Notes (Signed)
Patient ID: James Hamilton, male   DOB: 05/30/1986, 29 y.o.   MRN: [id number] .   James Hamilton, is a 29 y.o. male  UXL:[id number]  ZDG:[id number]  DOB - Jan 26, 1986  CC:  Chief Complaint  Patient presents with  . Follow-up    scd. Weight Loss      He  HPI: James Hamilton is a 29 y.o. male here for SCD and chronic pain follow-up and c/o unintentional weight loss over the last month. He reports he feels he is eating normally. His BMI is 7.2, down from a high of 25.59 last year. He reports he needs no refills today. He picked up his opoids prescriptions yesterday. He does admit to missing doses of Hydrea, sometimes up to a week. His constipation is being managed with OTC stool softners and/or laxatives.  No Known Allergies Past Medical History  Diagnosis Date  . Sickle cell disease   . Sickle cell anemia   . Closed TBI (traumatic brain injury) m-3  . Fracture of femur m-2    Multiple femoral fractures of RLE. S/P repair   Current Outpatient Prescriptions on File Prior to Visit  Medication Sig Dispense Refill  . fentaNYL (DURAGESIC - DOSED MCG/HR) 12 MCG/HR Place 1 patch (12.5 mcg total) onto the skin every 3 (three) days. 10 patch 0  . folic acid (FOLVITE) 1 MG tablet Take 1 tablet (1 mg total) by mouth daily. 30 tablet 11  . oxyCODONE (OXY IR/ROXICODONE) 5 MG immediate release tablet Take 1 tablet (5 mg total) by mouth every 4 (four) hours as needed for severe pain. 90 tablet 0  . ergocalciferol (DRISDOL) 50000 UNITS capsule Take 1 capsule (50,000 Units total) by mouth once a week. (Patient not taking: Reported on 06/01/2015) 4 capsule 12  . hydroxyurea (HYDREA) 500 MG capsule Take 1 capsule (500 mg total) by mouth 2 (two) times daily. May take with food to minimize GI side effects. (Patient not taking: Reported on 06/01/2015) 60 capsule 5  . ibuprofen (ADVIL,MOTRIN) 600 MG tablet Take 1 tablet (600 mg total) by mouth every 8 (eight) hours as needed. (Patient not taking:  Reported on 06/01/2015) 30 tablet 1  . methocarbamol (ROBAXIN) 500 MG tablet Take 500 mg by mouth 2 (two) times daily. Patient states he takes it every day     No current facility-administered medications on file prior to visit.   Family History  Problem Relation Age of Onset  . Thalassemia Mother   . Sickle cell trait Father   . Cerebral aneurysm Paternal Grandmother   . Cerebral aneurysm Paternal Uncle    History   Social History  . Marital Status: Single    Spouse Name: N/A  . Number of Children: N/A  . Years of Education: N/A   Occupational History  . Not on file.   Social History Main Topics  . Smoking status: Light Tobacco Smoker    Types: Cigarettes  . Smokeless tobacco: Not on file     Comment: Pt states he smokes loose cigarettes (2) at the most  . Alcohol Use: No  . Drug Use: No  . Sexual Activity: Yes    Birth Control/ Protection: None   Other Topics Concern  . Not on file   Social History Narrative   ** Merged History Encounter **        Review of Systems: Constitutional: Denies chills, fever. Admits to unintentional weight loss.  HENT: Denies Problems Eyes: Denies problems Neck: Denies problems Respiratory: Negative for cough,  shortness of breath,   Cardiovascular: Negative for chest pain, palpitations and leg swelling. Gastrointestinal: Negative for abdominal distention, abdominal pain, nausea, vomiting, diarrhea,  Genitourinary: Denies problems Musculoskeletal: Admits to usual back and leg pain. Some related to SCD, some related to previous injury (chronic) Neurological: Denies problems Hematological: Denies problems Psychiatric/Behavioral: Denies depression, anxiety.   Objective:   Filed Vitals:   06/01/15 0844  BP: 125/64  Pulse: 91  Temp: 98 F (36.7 C)  Resp: 16    Physical Exam: Constitutional: Patient appears well-developed and well-nourished. No distress.He weight is down 9 lbs since his last visit in June. HENT: Normocephalic,  atraumatic, External right and left ear normal. Oropharynx is clear and moist.  Eyes: Conjunctivae and EOM are normal. PERRLA, no scleral icterus. Neck: Normal ROM. Neck supple. No lymphadenopathy, No thyromegaly. CVS: RRR, S1/S2 +, no murmurs, no gallops, no rubs Pulmonary: Effort and breath sounds normal, no stridor, rhonchi, wheezes, rales.  Abdominal: Soft. Normoactive BS,, no distension, tenderness, rebound or guarding.  Musculoskeletal: Decreased ROM of back.No edema and no tenderness.  Neuro: Alert.Normal muscle tone coordination. Non-focal Skin: Skin is warm and dry. No rash noted. Not diaphoretic. No erythema. No pallor. Psychiatric: Normal mood and affect. Behavior, judgment, thought content normal.  Lab Results  Component Value Date   WBC 7.7 04/01/2015   HGB 14.1 04/01/2015   HCT 42.3 04/01/2015   MCV 79.1 04/01/2015   PLT 232 04/01/2015   Lab Results  Component Value Date   CREATININE 0.99 01/19/2015   BUN 12 01/19/2015   NA 138 01/19/2015   K 3.8 01/19/2015   CL 102 01/19/2015   CO2 24 01/19/2015    No results found for: HGBA1C Lipid Panel     Component Value Date/Time   CHOL 213* 04/15/2014 1149   TRIG 133 04/15/2014 1149   HDL 32* 04/15/2014 1149   CHOLHDL 6.7 04/15/2014 1149   VLDL 27 04/15/2014 1149   LDLCALC 154* 04/15/2014 1149       Assessment and plan:   SCD with chronic pain -Reminded him to take his Hydrea regulary and to keep well hydrated. Reminded him of content of medication contract. Provided written instructions of main points.  Unintentional weight loss -Cmet with GFR\ -CBC with diff -TSH -HIV. Will call with results and recommendations  Follow up in one month.  The patient was given clear instructions to go to ER or return to medical center if symptoms don't improve, worsen or new problems develop. The patient verbalized understanding. The patient was told to call to get lab results if they haven't heard anything in the next  week.       Micheline Chapman, MSN, FNP-BC   06/01/2015, 9:05 AM

## 2015-06-02 ENCOUNTER — Telehealth: Payer: Self-pay

## 2015-06-02 LAB — CBC WITH DIFFERENTIAL/PLATELET
BASOS ABS: 0.1 10*3/uL (ref 0.0–0.1)
BASOS PCT: 1 % (ref 0–1)
EOS ABS: 0.2 10*3/uL (ref 0.0–0.7)
Eosinophils Relative: 3 % (ref 0–5)
HEMATOCRIT: 41.7 % (ref 39.0–52.0)
Hemoglobin: 14.1 g/dL (ref 13.0–17.0)
LYMPHS PCT: 28 % (ref 12–46)
Lymphs Abs: 2.1 10*3/uL (ref 0.7–4.0)
MCH: 28.2 pg (ref 26.0–34.0)
MCHC: 33.8 g/dL (ref 30.0–36.0)
MCV: 83.4 fL (ref 78.0–100.0)
MONO ABS: 0.5 10*3/uL (ref 0.1–1.0)
MPV: 9.2 fL (ref 8.6–12.4)
Monocytes Relative: 6 % (ref 3–12)
NEUTROS ABS: 4.7 10*3/uL (ref 1.7–7.7)
Neutrophils Relative %: 62 % (ref 43–77)
Platelets: 190 10*3/uL (ref 150–400)
RBC: 5 MIL/uL (ref 4.22–5.81)
RDW: 16.8 % — AB (ref 11.5–15.5)
WBC: 7.5 10*3/uL (ref 4.0–10.5)

## 2015-06-02 NOTE — Telephone Encounter (Signed)
-----   Message from Micheline Chapman, NP sent at 06/02/2015  8:01 AM EDT ----- No reason showed up in labs for weight loss. Drink ensure between meals and at bedtime to see if we can pick up a few pounds.

## 2015-06-02 NOTE — Telephone Encounter (Signed)
Advised patient of normal labs and to drink ensure 4 times daily as discussed. Thanks!

## 2015-06-03 ENCOUNTER — Ambulatory Visit: Payer: Medicaid Other | Admitting: Physical Therapy

## 2015-06-03 ENCOUNTER — Encounter: Payer: Self-pay | Admitting: Physical Therapy

## 2015-06-03 DIAGNOSIS — M545 Low back pain, unspecified: Secondary | ICD-10-CM

## 2015-06-03 DIAGNOSIS — M5386 Other specified dorsopathies, lumbar region: Secondary | ICD-10-CM
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
## 2015-06-06 ENCOUNTER — Encounter: Payer: Self-pay | Admitting: Physical Therapy

## 2015-06-06 ENCOUNTER — Ambulatory Visit: Payer: Medicaid Other | Admitting: Physical Therapy

## 2015-06-06 DIAGNOSIS — M545 Low back pain, unspecified: Secondary | ICD-10-CM

## 2015-06-06 DIAGNOSIS — M5386 Other specified dorsopathies, lumbar region: Secondary | ICD-10-CM
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
## 2015-06-09 ENCOUNTER — Telehealth: Payer: Self-pay | Admitting: Family Medicine

## 2015-06-09 NOTE — Telephone Encounter (Signed)
Refill request for oxycodone 5mg . LOV 06/01/2015. Please advise. Thanks!

## 2015-06-10 ENCOUNTER — Other Ambulatory Visit: Payer: Self-pay | Admitting: Family Medicine

## 2015-06-10 ENCOUNTER — Encounter: Payer: Self-pay | Admitting: Physical Therapy

## 2015-06-10 ENCOUNTER — Ambulatory Visit: Payer: Medicaid Other | Admitting: Physical Therapy

## 2015-06-10 DIAGNOSIS — M545 Low back pain, unspecified: Secondary | ICD-10-CM

## 2015-06-10 DIAGNOSIS — D571 Sickle-cell disease without crisis: Secondary | ICD-10-CM

## 2015-06-10 MED ORDER — OXYCODONE HCL 5 MG PO TABS: 5.0000 mg | ORAL_TABLET | ORAL | Status: DC | PRN
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
## 2015-06-14 ENCOUNTER — Encounter: Payer: Self-pay | Admitting: Physical Therapy

## 2015-06-14 ENCOUNTER — Ambulatory Visit: Payer: Medicaid Other | Attending: Sports Medicine | Admitting: Physical Therapy

## 2015-06-14 DIAGNOSIS — M2560 Stiffness of unspecified joint, not elsewhere classified: Secondary | ICD-10-CM | POA: Diagnosis present

## 2015-06-14 DIAGNOSIS — M545 Low back pain, unspecified: Secondary | ICD-10-CM
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
## 2015-06-17 ENCOUNTER — Ambulatory Visit: Payer: Medicaid Other | Admitting: Physical Therapy

## 2015-06-21 ENCOUNTER — Encounter: Payer: Self-pay | Admitting: Physical Therapy

## 2015-06-21 ENCOUNTER — Ambulatory Visit: Payer: Medicaid Other | Admitting: Physical Therapy

## 2015-06-21 DIAGNOSIS — M5386 Other specified dorsopathies, lumbar region: Secondary | ICD-10-CM

## 2015-06-21 DIAGNOSIS — M545 Low back pain, unspecified: Secondary | ICD-10-CM
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
## 2015-06-23 ENCOUNTER — Telehealth: Payer: Self-pay

## 2015-06-23 NOTE — Telephone Encounter (Signed)
REFILL REQUEST FOR OXYCODONE 5MG  AND FENTANYL PATCH. lov 7/20/20216. PLEASE ADVISE. THANKS!

## 2015-06-24 ENCOUNTER — Ambulatory Visit: Payer: Medicaid Other | Admitting: Physical Therapy

## 2015-06-24 ENCOUNTER — Encounter: Payer: Self-pay | Admitting: Physical Therapy

## 2015-06-24 DIAGNOSIS — M545 Low back pain, unspecified: Secondary | ICD-10-CM
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
## 2015-06-25 ENCOUNTER — Other Ambulatory Visit: Payer: Self-pay | Admitting: Family Medicine

## 2015-06-25 DIAGNOSIS — D571 Sickle-cell disease without crisis: Secondary | ICD-10-CM

## 2015-06-25 MED ORDER — OXYCODONE HCL 5 MG PO TABS: 5.0000 mg | ORAL_TABLET | ORAL | Status: DC | PRN

## 2015-06-28 ENCOUNTER — Ambulatory Visit: Payer: Medicaid Other | Admitting: Physical Therapy

## 2015-06-28 ENCOUNTER — Encounter: Payer: Self-pay | Admitting: Physical Therapy

## 2015-06-28 DIAGNOSIS — M545 Low back pain, unspecified: Secondary | ICD-10-CM
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
## 2015-06-30 ENCOUNTER — Ambulatory Visit: Payer: Medicaid Other | Admitting: Physical Therapy

## 2015-07-04 ENCOUNTER — Ambulatory Visit (INDEPENDENT_AMBULATORY_CARE_PROVIDER_SITE_OTHER): Payer: Medicaid Other | Admitting: Family Medicine

## 2015-07-04 ENCOUNTER — Encounter: Payer: Self-pay | Admitting: Family Medicine

## 2015-07-04 VITALS — BP 124/72 | HR 63 | Temp 97.7°F | Resp 16 | Ht 74.0 in | Wt 142.0 lb

## 2015-07-04 DIAGNOSIS — D571 Sickle-cell disease without crisis: Secondary | ICD-10-CM

## 2015-07-04 MED ORDER — FENTANYL 12 MCG/HR TD PT72: 12.5000 ug | MEDICATED_PATCH | TRANSDERMAL | Status: DC
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
## 2015-07-04 NOTE — Patient Instructions (Signed)
Continue same treatment. Rembember to keep medications in a safe locked place Follow-up in one one and as needed otherwise

## 2015-07-05 ENCOUNTER — Ambulatory Visit: Payer: Medicaid Other | Admitting: Physical Therapy

## 2015-07-07 ENCOUNTER — Ambulatory Visit: Payer: Medicaid Other | Admitting: Physical Therapy

## 2015-07-07 ENCOUNTER — Encounter: Payer: Self-pay | Admitting: Physical Therapy

## 2015-07-07 DIAGNOSIS — M545 Low back pain, unspecified: Secondary | ICD-10-CM
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
## 2015-07-08 ENCOUNTER — Other Ambulatory Visit: Payer: Self-pay | Admitting: Family Medicine

## 2015-07-08 NOTE — Telephone Encounter (Signed)
Refill request for oxycodone 5mg  LOV 07/04/2015. Please advise. Thanks!

## 2015-07-11 ENCOUNTER — Encounter: Payer: Self-pay | Admitting: Physical Therapy

## 2015-07-11 ENCOUNTER — Other Ambulatory Visit: Payer: Self-pay | Admitting: Family Medicine

## 2015-07-11 ENCOUNTER — Ambulatory Visit: Payer: Medicaid Other | Admitting: Physical Therapy

## 2015-07-11 DIAGNOSIS — M545 Low back pain, unspecified: Secondary | ICD-10-CM
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
## 2015-07-12 ENCOUNTER — Other Ambulatory Visit: Payer: Self-pay | Admitting: Family Medicine

## 2015-07-12 DIAGNOSIS — D571 Sickle-cell disease without crisis: Secondary | ICD-10-CM

## 2015-07-12 MED ORDER — OXYCODONE HCL 5 MG PO TABS: 5.0000 mg | ORAL_TABLET | ORAL | Status: DC | PRN

## 2015-07-13 ENCOUNTER — Ambulatory Visit: Payer: Medicaid Other | Admitting: Physical Therapy

## 2015-07-19 ENCOUNTER — Encounter: Payer: Medicaid Other | Attending: Physical Medicine & Rehabilitation | Admitting: Physical Medicine & Rehabilitation

## 2015-07-19 ENCOUNTER — Encounter: Payer: Self-pay | Admitting: Physical Medicine & Rehabilitation

## 2015-07-19 DIAGNOSIS — D571 Sickle-cell disease without crisis: Secondary | ICD-10-CM | POA: Insufficient documentation

## 2015-07-19 DIAGNOSIS — S069X4S Unspecified intracranial injury with loss of consciousness of 6 hours to 24 hours, sequela: Secondary | ICD-10-CM | POA: Diagnosis not present

## 2015-07-19 DIAGNOSIS — X58XXXS Exposure to other specified factors, sequela: Secondary | ICD-10-CM | POA: Insufficient documentation

## 2015-07-19 DIAGNOSIS — Z79899 Other long term (current) drug therapy: Secondary | ICD-10-CM | POA: Diagnosis not present

## 2015-07-19 DIAGNOSIS — F17210 Nicotine dependence, cigarettes, uncomplicated: Secondary | ICD-10-CM | POA: Diagnosis not present

## 2015-07-19 DIAGNOSIS — S7291XS Unspecified fracture of right femur, sequela: Secondary | ICD-10-CM | POA: Insufficient documentation

## 2015-07-19 MED ORDER — IBUPROFEN 600 MG PO TABS: 600.0000 mg | ORAL_TABLET | Freq: Three times a day (TID) | ORAL | Status: AC | PRN

## 2015-07-19 MED ORDER — BACLOFEN 10 MG PO TABS: 10.0000 mg | ORAL_TABLET | Freq: Three times a day (TID) | ORAL | Status: DC | PRN

## 2015-07-19 NOTE — Patient Instructions (Signed)
CHECK ON EYE APPOINTMENT---MOVE IT UP IF YOU CAN----HEADACHES ARE MOST LIKELY RELATED TO YOUR VISION   TRY TAKING 600 MG IBUPROFEN TWICE DAILY WITH FOOD FOR THE NEXT 7-10 DAYS   BACLOFEN FOR SPASMS PER BOTTLE   WORKING ON GOOD STANDING AND WALKING POSTURE.  STRETCH YOUR BACK ALSO EACH DAY.

## 2015-07-19 NOTE — Progress Notes (Signed)
Subjective:    Patient ID: [name], male    DOB: [birth year], 29 y.o.   MRN: [id number]  HPI   James Hamilton is here in follow up of his TBI. He is out of his wheelchair and in PT currently. Pt is working on leg strength, core muscle and upper body strength.   He is having pain still predominantly in his low back and his right leg. Pain is worse with bending or standing for long periods of time. He receives his medications from the sickle cell clinic. He's on fentanyl and oxycodone currenlty for pain.    He is still having headaches usually over the frontal area and between the eyes. He hasn't had his eyes checked since last December. He admits to his vision being dizzy for objects afar.   From a cognitive standpoint, he's improving and feels that he's near baseline.    Pain meds prescribed by Sickle Cell dept  Pain Inventory Average Pain 7 Pain Right Now 7 My pain is sharp, stabbing and aching  In the last 24 hours, has pain interfered with the following? General activity 8 Relation with others 3 Enjoyment of life 9 What TIME of day is your pain at its worst? night Sleep (in general) Poor  Pain is worse with: bending and standing Pain improves with: therapy/exercise and medication Relief from Meds: 6  Mobility walk without assistance  Function I need assistance with the following:  meal prep, household duties and shopping  Neuro/Psych weakness trouble walking dizziness  Prior Studies Any changes since last visit?  no  Physicians involved in your care Any changes since last visit?  no   Family History  Problem Relation Age of Onset  . Thalassemia Mother   . Sickle cell trait Father   . Cerebral aneurysm Paternal Grandmother   . Cerebral aneurysm Paternal Uncle    Social History   Social History  . Marital Status: Single    Spouse Name: N/A  . Number of Children: N/A  . Years of Education: N/A   Social History Main Topics  . Smoking status:  Light Tobacco Smoker    Types: Cigarettes  . Smokeless tobacco: None     Comment: Pt states he smokes loose cigarettes (2) at the most  . Alcohol Use: No  . Drug Use: No  . Sexual Activity: Yes    Birth Control/ Protection: None   Other Topics Concern  . None   Social History Narrative   ** Merged History Encounter **       Past Surgical History  Procedure Laterality Date  . Fracture surgery Right 12/16/2013    Femoral neck and femoral shaft fractures with IM nailing of femur and compression screw to right hip  . Femur im nail Right 12/16/2013    Procedure: INTRAMEDULLARY (IM) RETROGRADE FEMORAL NAILING;  Surgeon: Renette Butters, MD;  Location: [facility];  Service: Orthopedics;  Laterality: Right;  . Compression hip screw Right 12/16/2013    Procedure: COMPRESSION HIP;  Surgeon: Renette Butters, MD;  Location: [facility];  Service: Orthopedics;  Laterality: Right;   Past Medical History  Diagnosis Date  . Sickle cell disease   . Sickle cell anemia   . Closed TBI (traumatic brain injury) m-3  . Fracture of femur m-2    Multiple femoral fractures of RLE. S/P repair   There were no vitals taken for this visit.  Opioid Risk Score:   Fall Risk Score:  `1  Depression screen  PHQ 2/9  Depression screen PHQ 2/9 07/19/2015 07/04/2015 06/01/2015 05/02/2015 04/01/2015 01/19/2015 03/01/2014  Decreased Interest 0 0 0 0 0 0 0  Down, Depressed, Hopeless 1 0 0 0 0 0 0  PHQ - 2 Score 1 0 0 0 0 0 0  Altered sleeping 2 - - - - - -  Tired, decreased energy 2 - - - - - -  Change in appetite 1 - - - - - -  Feeling bad or failure about yourself  0 - - - - - -  Trouble concentrating 0 - - - - - -  Moving slowly or fidgety/restless 0 - - - - - -  Suicidal thoughts 0 - - - - - -  PHQ-9 Score 6 - - - - - -      Review of Systems  Musculoskeletal: Positive for gait problem.  Neurological: Positive for dizziness and weakness.  All other systems reviewed and are negative.      Objective:   Physical  Exam  HEENT: Head is normocephalic, atraumatic, PERRLA, EOMI, sclera anicteric, oral mucosa pink and moist, dentition intact, ext ear canals clear,  Neck: Supple without JVD or lymphadenopathy  Heart: Reg rate and rhythm. No murmurs rubs or gallops  Chest: CTA bilaterally without wheezes, rales, or rhonchi; no distress  Abdomen: Soft, non-tender, non-distended, bowel sounds positive.  Extremities: No clubbing, cyanosis, or edema. Pulses are 2+  Skin: Wounds have healed  Neuro: more alert, appropriate. Has good insight and awareness. Processing and memory are better. Moves all 4's but has limitations at the right hip and knee due to discomfort. No sensory deficits. Decreased visual acutiy  Musculoskeletal:  Right sided antalgia with weight bearing. Leans to right as well compensating with low back. Right lower lumbar paraspinals tender and taut.  Psych: Pt's affect is more dynamic. He is pleasant and appropriate.   Assessment/Plan:   1. TBI with skull fracture, right femoral neck fracture 12/16/13 with compression screw and right femoral shaft fracture with IM nail--NWB RLE.  2.RLE pain/sickle cell crisis  -oxy IR prn, fentanyl patch per SS team -add baclofen prn for muscle spasm  -retry ibuprofen 600mg  BID scheduled for 7-10 days and then prn again -continue PT for strengthening, modalities, HEP, 3. Low back pain- baclofen prn, encouraged improved posture with standing and gait as this pain appears to be muscular and compensatory in nature. 4.  Headaches. Likely related to vision  -encouraged follow up with optho for eye exam 9. Right shoulder---proximal, greater tuberosity humerus fx's--healed.  .  Follow up with me in about 4 months. 15 minutes of face to face patient care time were spent during this visit. All questions were encouraged and answered.

## 2015-07-20 ENCOUNTER — Ambulatory Visit: Payer: Medicaid Other | Attending: Sports Medicine | Admitting: Physical Therapy

## 2015-07-20 ENCOUNTER — Encounter: Payer: Self-pay | Admitting: Physical Therapy

## 2015-07-20 DIAGNOSIS — M545 Low back pain, unspecified: Secondary | ICD-10-CM
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
## 2015-07-21 ENCOUNTER — Telehealth: Payer: Self-pay | Admitting: Family Medicine

## 2015-07-21 NOTE — Telephone Encounter (Signed)
Refill request for Oxycodone 5mg  . LOV 07/04/2015 Please advise. Thanks!

## 2015-07-22 ENCOUNTER — Ambulatory Visit: Payer: Medicaid Other | Admitting: Physical Therapy

## 2015-07-22 ENCOUNTER — Other Ambulatory Visit: Payer: Self-pay | Admitting: Family Medicine

## 2015-07-22 DIAGNOSIS — D571 Sickle-cell disease without crisis: Secondary | ICD-10-CM

## 2015-07-22 MED ORDER — OXYCODONE HCL 5 MG PO TABS: 5.0000 mg | ORAL_TABLET | ORAL | Status: DC | PRN

## 2015-07-27 ENCOUNTER — Ambulatory Visit: Payer: Medicaid Other | Admitting: Physical Therapy

## 2015-07-27 ENCOUNTER — Encounter: Payer: Self-pay | Admitting: Physical Therapy

## 2015-07-27 DIAGNOSIS — M545 Low back pain, unspecified: Secondary | ICD-10-CM
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
## 2015-08-04 ENCOUNTER — Ambulatory Visit (INDEPENDENT_AMBULATORY_CARE_PROVIDER_SITE_OTHER): Payer: Medicaid Other | Admitting: Family Medicine

## 2015-08-04 VITALS — BP 97/75 | HR 75 | Temp 98.0°F | Resp 18 | Ht 74.0 in | Wt 141.0 lb

## 2015-08-04 DIAGNOSIS — Z23 Encounter for immunization: Secondary | ICD-10-CM

## 2015-08-04 DIAGNOSIS — D571 Sickle-cell disease without crisis: Secondary | ICD-10-CM | POA: Diagnosis not present

## 2015-08-04 LAB — CBC WITH DIFFERENTIAL/PLATELET
BASOS ABS: 0 10*3/uL (ref 0.0–0.1)
Basophils Relative: 0 % (ref 0–1)
Eosinophils Absolute: 0 10*3/uL (ref 0.0–0.7)
Eosinophils Relative: 0 % (ref 0–5)
HEMATOCRIT: 43.6 % (ref 39.0–52.0)
HEMOGLOBIN: 14.6 g/dL (ref 13.0–17.0)
LYMPHS ABS: 1.7 10*3/uL (ref 0.7–4.0)
LYMPHS PCT: 19 % (ref 12–46)
MCH: 27 pg (ref 26.0–34.0)
MCHC: 33.5 g/dL (ref 30.0–36.0)
MCV: 80.6 fL (ref 78.0–100.0)
MPV: 9.5 fL (ref 8.6–12.4)
Monocytes Absolute: 0.4 10*3/uL (ref 0.1–1.0)
Monocytes Relative: 5 % (ref 3–12)
NEUTROS ABS: 6.8 10*3/uL (ref 1.7–7.7)
Neutrophils Relative %: 76 % (ref 43–77)
Platelets: 236 10*3/uL (ref 150–400)
RBC: 5.41 MIL/uL (ref 4.22–5.81)
RDW: 15.8 % — ABNORMAL HIGH (ref 11.5–15.5)
WBC: 8.9 10*3/uL (ref 4.0–10.5)

## 2015-08-04 MED ORDER — OXYCODONE-ACETAMINOPHEN 10-325 MG PO TABS: 1.0000 | ORAL_TABLET | Freq: Three times a day (TID) | ORAL | Status: DC | PRN

## 2015-08-04 MED ORDER — FENTANYL 12 MCG/HR TD PT72: 12.5000 ug | MEDICATED_PATCH | TRANSDERMAL | Status: DC

## 2015-08-04 NOTE — Progress Notes (Signed)
Patient ID: James Hamilton, male   DOB: [birth year], 29 y.o.   MRN: [id number]   James Hamilton, is a 29 y.o. male  GYI:[id number]  JJK:[id number]  DOB - 10-05-1986  CC: No chief complaint on file.      HPI: James Hamilton is a 29 y.o. male here sickle cell follow-up. He was seen last on 07/04/15. I have reviewed the [place] controlled substance web site and there is no indication of inappropriate opoid use. He is currently on Fentanyl 12.5 patch every 3 day. And Oxycodone 5 mg every 4 hours prn. She is also on folic acid and Hydrea, and Drisdol. These are listed in his medication list which has been reviewed. He reports per instructions of James Hamilton, he has been using 2 of his 5 mg oxycodone        Rather that one and taking every 6 hours rather than every 4 and would like to change his prescription to 10 mg q 6 prn.   No Known Allergies Past Medical History  Diagnosis Date  . Sickle cell disease   . Sickle cell anemia   . Closed TBI (traumatic brain injury) m-3  . Fracture of femur m-2    Multiple femoral fractures of RLE. S/P repair   Current Outpatient Prescriptions on File Prior to Visit  Medication Sig Dispense Refill  . baclofen (LIORESAL) 10 MG tablet Take 1 tablet (10 mg total) by mouth every 8 (eight) hours as needed for muscle spasms. 60 tablet 3  . ergocalciferol (DRISDOL) 50000 UNITS capsule Take 1 capsule (50,000 Units total) by mouth once a week. (Patient not taking: Reported on 06/01/2015) 4 capsule 12  . fentaNYL (DURAGESIC - DOSED MCG/HR) 12 MCG/HR Place 1 patch (12.5 mcg total) onto the skin every 3 (three) days. 10 patch 0  . folic acid (FOLVITE) 1 MG tablet Take 1 tablet (1 mg total) by mouth daily. 30 tablet 11  . hydroxyurea (HYDREA) 500 MG capsule Take 1 capsule (500 mg total) by mouth 2 (two) times daily. May take with food to minimize GI side effects. (Patient not taking: Reported on 06/01/2015) 60 capsule 5  . ibuprofen (ADVIL,MOTRIN) 600 MG tablet Take 1  tablet (600 mg total) by mouth every 8 (eight) hours as needed. 60 tablet 1  . oxyCODONE (OXY IR/ROXICODONE) 5 MG immediate release tablet Take 1 tablet (5 mg total) by mouth every 4 (four) hours as needed for severe pain. 90 tablet 0   No current facility-administered medications on file prior to visit.   Family History  Problem Relation Age of Onset  . Thalassemia Mother   . Sickle cell trait Father   . Cerebral aneurysm Paternal Grandmother   . Cerebral aneurysm Paternal Uncle    Social History   Social History  . Marital Status: Single    Spouse Name: N/A  . Number of Children: N/A  . Years of Education: N/A   Occupational History  . Not on file.   Social History Main Topics  . Smoking status: Light Tobacco Smoker    Types: Cigarettes  . Smokeless tobacco: Not on file     Comment: Pt states he smokes loose cigarettes (2) at the most  . Alcohol Use: No  . Drug Use: No  . Sexual Activity: Yes    Birth Control/ Protection: None   Other Topics Concern  . Not on file   Social History Narrative   ** Merged History Encounter **  Review of Systems: Constitutional: Negative for fever, chills, weight loss, appetite loss HENT: Denies Problems Eyes: Denies problems Neck: Denies problems Respiratory: Negative for cough, shortness of breath,   Cardiovascular: Negative for chest pain, palpitations and leg swelling. Gastrointestinal: Negative for abdominal pain, nausea,vomitng, diarrhea, constipation. Genitourinary: Denies problems Musculoskeletal: Denies problems Neurological: Denies problems Hematological: Denies problems Psychiatric/Behavioral: Denies depression, anxiety.   Objective:  There were no vitals filed for this visit.  Physical Exam: Constitutional: Patient appears well-developed and well-nourished. No distress. HENT: Normocephalic, atraumatic, External right and left ear normal. Oropharynx is clear and moist.  Eyes: Conjunctivae and EOM are  normal. PERRLA, no scleral icterus. Neck: Normal ROM. Neck supple. No lymphadenopathy, No thyromegaly. CVS: RRR, S1/S2 +, no murmurs, no gallops, no rubs Pulmonary: Effort and breath sounds normal, no stridor, rhonchi, wheezes, rales.  Abdominal: Soft. Normoactive BS,, no distension, tenderness, rebound or guarding.  Musculoskeletal: Normal range of motion. No edema and no tenderness.  Neuro: Alert.Normal muscle tone coordination. Non-focal Skin: Skin is warm and dry. No rash noted. Not diaphoretic. No erythema. No pallor. Psychiatric: Normal mood and affect. Behavior, judgment, thought content normal.  Lab Results  Component Value Date   WBC 7.5 06/01/2015   HGB 14.1 06/01/2015   HCT 41.7 06/01/2015   MCV 83.4 06/01/2015   PLT 190 06/01/2015   Lab Results  Component Value Date   CREATININE 0.90 06/01/2015   BUN 10 06/01/2015   NA 139 06/01/2015   K 4.3 06/01/2015   CL 103 06/01/2015   CO2 25 06/01/2015    No results found for: HGBA1C Lipid Panel     Component Value Date/Time   CHOL 213* 04/15/2014 1149   TRIG 133 04/15/2014 1149   HDL 32* 04/15/2014 1149   CHOLHDL 6.7 04/15/2014 1149   VLDL 27 04/15/2014 1149   LDLCALC 154* 04/15/2014 1149       Assessment and plan:   1. Hb-SS disease without crisis - CBC with Differential - Vitamin D 1,25 dihydroxy - Microalbumin, urine   2. Sickle Cell Disease -I have refilled his Fentanyl patches, # 10, one every 3 days -oxycondone/acetamephen 10/325 #60, one po q 6 hours prn.  Patient counseled and given handout on use of opoid pain medications, including need to only get from [place] and our ability to follow use on [place]  [facility] and need to keep in a safe locked place away from children and pets. Have reviewed our refill policy related to erly refills if lost or stolen. Have reviewed possible side effects of opoids, Hydrea and need to take other SCD related medications as ordered.  Have review health maintenance needs, including  immunizations, urine for proteim, dilated eye exam.  Have review the importance of smoking cessation.  The patient was given clear instructions to go to ER or return to medical center if symptoms don't improve, worsen or new problems develop. The patient verbalized understanding. The patient was told to call to get lab results if they haven't heard anything in the next week.     Follow-up one month.       Micheline Chapman, MSN, FNP-BC   08/04/2015, 11:00 AM +

## 2015-08-04 NOTE — Patient Instructions (Addendum)
Review medication handout provided Continue current Fentanyl patches I have changed your prescription to oxycodone/acetamenophen 10/325 which you can take every 6 hours as needed Follow-up in one month. Review printed information I gave you

## 2015-08-05 ENCOUNTER — Encounter: Payer: Self-pay | Admitting: Family Medicine

## 2015-08-05 LAB — MICROALBUMIN, URINE: Microalb, Ur: 4.1 mg/dL — ABNORMAL HIGH (ref <2.0)

## 2015-08-07 LAB — VITAMIN D 1,25 DIHYDROXY
VITAMIN D2 1, 25 (OH): 41 pg/mL
VITAMIN D3 1, 25 (OH): 36 pg/mL
Vitamin D 1, 25 (OH)2 Total: 77 pg/mL — ABNORMAL HIGH (ref 18–72)

## 2015-08-15 ENCOUNTER — Telehealth: Payer: Self-pay | Admitting: Internal Medicine

## 2015-08-15 ENCOUNTER — Other Ambulatory Visit: Payer: Self-pay | Admitting: Family Medicine

## 2015-08-15 MED ORDER — OXYCODONE-ACETAMINOPHEN 10-325 MG PO TABS: 1.0000 | ORAL_TABLET | Freq: Three times a day (TID) | ORAL | Status: DC | PRN

## 2015-08-15 NOTE — Telephone Encounter (Signed)
Refill request for percocet 10/325mg . LOV 08/04/2015. Please advise. Thanks!

## 2015-09-05 ENCOUNTER — Ambulatory Visit (INDEPENDENT_AMBULATORY_CARE_PROVIDER_SITE_OTHER): Payer: Medicaid Other | Admitting: Family Medicine

## 2015-09-05 VITALS — BP 136/84 | HR 63 | Temp 98.0°F | Resp 16 | Ht 74.0 in | Wt 141.0 lb

## 2015-09-05 DIAGNOSIS — D571 Sickle-cell disease without crisis: Secondary | ICD-10-CM

## 2015-09-05 MED ORDER — OXYCODONE-ACETAMINOPHEN 10-325 MG PO TABS: 1.0000 | ORAL_TABLET | Freq: Three times a day (TID) | ORAL | Status: DC | PRN

## 2015-09-05 MED ORDER — FOLIC ACID 1 MG PO TABS: 1.0000 mg | ORAL_TABLET | Freq: Every day | ORAL | Status: DC

## 2015-09-05 MED ORDER — FENTANYL 12 MCG/HR TD PT72: 12.5000 ug | MEDICATED_PATCH | TRANSDERMAL | Status: DC

## 2015-09-05 NOTE — Patient Instructions (Signed)
Continue current treatment Return for routine recheck in one month.
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
## 2015-09-06 ENCOUNTER — Encounter: Payer: Self-pay | Admitting: Family Medicine

## 2015-09-07 MED ORDER — FOLIC ACID 1 MG PO TABS: 1.0000 mg | ORAL_TABLET | Freq: Every day | ORAL | Status: DC

## 2015-09-21 ENCOUNTER — Telehealth: Payer: Self-pay | Admitting: Internal Medicine

## 2015-09-21 NOTE — Telephone Encounter (Signed)
Refill request for percocet 10/325mg . LOV 09/05/2015. Please advise. Thanks!

## 2015-09-27 NOTE — Telephone Encounter (Signed)
Linda, Please advise of refill request for percocet 10/325mg . LOV 09/05/2015. Thanks!

## 2015-09-28 ENCOUNTER — Ambulatory Visit (INDEPENDENT_AMBULATORY_CARE_PROVIDER_SITE_OTHER): Payer: Medicaid Other | Admitting: Family Medicine

## 2015-09-28 ENCOUNTER — Encounter: Payer: Self-pay | Admitting: Family Medicine

## 2015-09-28 VITALS — BP 140/87 | HR 62 | Temp 97.9°F | Resp 16 | Ht 75.0 in | Wt 145.0 lb

## 2015-09-28 DIAGNOSIS — D571 Sickle-cell disease without crisis: Secondary | ICD-10-CM | POA: Diagnosis not present

## 2015-09-28 DIAGNOSIS — E559 Vitamin D deficiency, unspecified: Secondary | ICD-10-CM | POA: Diagnosis not present

## 2015-09-28 DIAGNOSIS — G8929 Other chronic pain: Secondary | ICD-10-CM | POA: Diagnosis not present

## 2015-09-28 DIAGNOSIS — Z720 Tobacco use: Secondary | ICD-10-CM

## 2015-09-28 LAB — CBC WITH DIFFERENTIAL/PLATELET
BASOS ABS: 0 10*3/uL (ref 0.0–0.1)
BASOS PCT: 0 % (ref 0–1)
EOS ABS: 0.1 10*3/uL (ref 0.0–0.7)
Eosinophils Relative: 1 % (ref 0–5)
HCT: 41.2 % (ref 39.0–52.0)
HEMOGLOBIN: 13.6 g/dL (ref 13.0–17.0)
LYMPHS ABS: 1.7 10*3/uL (ref 0.7–4.0)
Lymphocytes Relative: 22 % (ref 12–46)
MCH: 26.3 pg (ref 26.0–34.0)
MCHC: 33 g/dL (ref 30.0–36.0)
MCV: 79.5 fL (ref 78.0–100.0)
MONOS PCT: 4 % (ref 3–12)
MPV: 10.8 fL (ref 8.6–12.4)
Monocytes Absolute: 0.3 10*3/uL (ref 0.1–1.0)
NEUTROS ABS: 5.5 10*3/uL (ref 1.7–7.7)
NEUTROS PCT: 73 % (ref 43–77)
PLATELETS: 267 10*3/uL (ref 150–400)
RBC: 5.18 MIL/uL (ref 4.22–5.81)
RDW: 15.3 % (ref 11.5–15.5)
WBC: 7.5 10*3/uL (ref 4.0–10.5)

## 2015-09-28 LAB — COMPLETE METABOLIC PANEL WITH GFR
ALBUMIN: 4.1 g/dL (ref 3.6–5.1)
ALK PHOS: 77 U/L (ref 40–115)
ALT: 9 U/L (ref 9–46)
AST: 13 U/L (ref 10–40)
BUN: 8 mg/dL (ref 7–25)
CHLORIDE: 105 mmol/L (ref 98–110)
CO2: 26 mmol/L (ref 20–31)
Calcium: 9.3 mg/dL (ref 8.6–10.3)
Creat: 0.93 mg/dL (ref 0.60–1.35)
GFR, Est African American: >89 mL/min (ref >=60)
GLUCOSE: 66 mg/dL (ref 65–99)
POTASSIUM: 4.3 mmol/L (ref 3.5–5.3)
SODIUM: 139 mmol/L (ref 135–146)
Total Bilirubin: 0.3 mg/dL (ref 0.2–1.2)
Total Protein: 6.9 g/dL (ref 6.1–8.1)

## 2015-09-28 LAB — RETICULOCYTES
ABS RETIC: 72.5 10*3/uL (ref 19.0–186.0)
RBC.: 5.18 MIL/uL (ref 4.22–5.81)
RETIC CT PCT: 1.4 % (ref 0.4–2.3)

## 2015-09-28 LAB — POCT URINALYSIS DIP (DEVICE)
BILIRUBIN URINE: NEGATIVE
Glucose, UA: NEGATIVE mg/dL
HGB URINE DIPSTICK: NEGATIVE
KETONES UR: NEGATIVE mg/dL
Leukocytes, UA: NEGATIVE
Nitrite: NEGATIVE
PH: 5.5 (ref 5.0–8.0)
PROTEIN: NEGATIVE mg/dL
SPECIFIC GRAVITY, URINE: 1.02 (ref 1.005–1.030)
Urobilinogen, UA: 0.2 mg/dL (ref 0.0–1.0)

## 2015-09-28 MED ORDER — ERGOCALCIFEROL 1.25 MG (50000 UT) PO CAPS: 50000.0000 [IU] | ORAL_CAPSULE | ORAL | Status: DC

## 2015-09-28 MED ORDER — HYDROXYUREA 500 MG PO CAPS: 500.0000 mg | ORAL_CAPSULE | Freq: Every day | ORAL | Status: DC

## 2015-09-28 MED ORDER — OXYCODONE-ACETAMINOPHEN 10-325 MG PO TABS: 1.0000 | ORAL_TABLET | Freq: Four times a day (QID) | ORAL | Status: DC | PRN

## 2015-09-28 MED ORDER — BACLOFEN 10 MG PO TABS: 10.0000 mg | ORAL_TABLET | Freq: Three times a day (TID) | ORAL | Status: DC | PRN
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
## 2015-10-02 LAB — FENTANYL (GC/LC/MS), URINE
Fentanyl, confirm: 1.2 ng/mL — AB (ref <0.5)
NORFENTANYL (GC/MS) CONFIRM: 6.1 ng/mL — AB (ref <0.5)

## 2015-10-02 LAB — CANNABANOIDS (GC/LC/MS), URINE: THC-COOH UR CONFIRM: 172 ng/mL — AB (ref <5)

## 2015-10-04 ENCOUNTER — Telehealth: Payer: Self-pay | Admitting: Internal Medicine

## 2015-10-04 ENCOUNTER — Ambulatory Visit: Payer: Medicaid Other | Admitting: Family Medicine

## 2015-10-04 DIAGNOSIS — D571 Sickle-cell disease without crisis: Secondary | ICD-10-CM

## 2015-10-04 LAB — PRESCRIPTION MONITORING PROFILE (SOLSTAS)
Amphetamine/Meth: NEGATIVE ng/mL
BARBITURATE SCREEN, URINE: NEGATIVE ng/mL
BENZODIAZEPINE SCREEN, URINE: NEGATIVE ng/mL
Buprenorphine, Urine: NEGATIVE ng/mL
CARISOPRODOL, URINE: NEGATIVE ng/mL
COCAINE METABOLITES: NEGATIVE ng/mL
CREATININE, URINE: 124.12 mg/dL (ref >20.0)
ECSTASY: NEGATIVE ng/mL
MEPERIDINE UR: NEGATIVE ng/mL
METHADONE SCREEN, URINE: NEGATIVE ng/mL
Nitrites, Initial: NEGATIVE ug/mL
Opiate Screen, Urine: NEGATIVE ng/mL
Oxycodone Screen, Ur: NEGATIVE ng/mL
PROPOXYPHENE: NEGATIVE ng/mL
Tapentadol, urine: NEGATIVE ng/mL
Tramadol Scrn, Ur: NEGATIVE ng/mL
ZOLPIDEM, URINE: NEGATIVE ng/mL
pH, Initial: 5.1 pH (ref 4.5–8.9)

## 2015-10-04 MED ORDER — FENTANYL 12 MCG/HR TD PT72: 12.5000 ug | MEDICATED_PATCH | TRANSDERMAL | Status: DC

## 2015-10-04 NOTE — Telephone Encounter (Signed)
Refill request for fentanyl 37mcg LOV 09/28/2015. Please advise. Thanks!

## 2015-10-04 NOTE — Telephone Encounter (Signed)
Reviewed [place] Substance Reporting system prior to reorder; no inconsistencies noted.  Meds ordered this encounter  Medications  . fentaNYL (DURAGESIC - DOSED MCG/HR) 12 MCG/HR    Sig: Place 1 patch (12.5 mcg total) onto the skin every 3 (three) days.    Dispense:  10 patch    Refill:  0    Order Specific Question:  Supervising Provider    Answer:  [name] [id number]    Dorena Dew, FNP

## 2015-10-14 ENCOUNTER — Telehealth: Payer: Self-pay

## 2015-10-14 DIAGNOSIS — D571 Sickle-cell disease without crisis: Secondary | ICD-10-CM

## 2015-10-14 MED ORDER — OXYCODONE-ACETAMINOPHEN 10-325 MG PO TABS: 1.0000 | ORAL_TABLET | Freq: Four times a day (QID) | ORAL | Status: DC | PRN

## 2015-10-14 NOTE — Telephone Encounter (Signed)
Reviewed [place] Substance Reporting system prior to reorder, no inconsistencies noted.    Meds ordered this encounter  Medications  . oxyCODONE-acetaminophen (PERCOCET) 10-325 MG tablet    Sig: Take 1 tablet by mouth every 6 (six) hours as needed for pain.    Dispense:  90 tablet    Refill:  0    Rx cannot be filled prior to 10/17/2015    Order Specific Question:  Supervising Provider    Answer:  [name] [id number]   Dorena Dew, FNP

## 2015-10-14 NOTE — Telephone Encounter (Signed)
Refill request for oxycodone 10mg . LOV 09/28/2015. Please advise. Thanks!

## 2015-10-28 ENCOUNTER — Ambulatory Visit: Payer: Medicaid Other | Admitting: Family Medicine

## 2015-11-01 ENCOUNTER — Ambulatory Visit (INDEPENDENT_AMBULATORY_CARE_PROVIDER_SITE_OTHER): Payer: Medicaid Other | Admitting: Family Medicine

## 2015-11-01 VITALS — BP 129/74 | HR 74 | Temp 98.0°F | Resp 16 | Ht 74.0 in | Wt 145.0 lb

## 2015-11-01 DIAGNOSIS — D571 Sickle-cell disease without crisis: Secondary | ICD-10-CM | POA: Diagnosis not present

## 2015-11-01 DIAGNOSIS — Z720 Tobacco use: Secondary | ICD-10-CM

## 2015-11-01 DIAGNOSIS — G8929 Other chronic pain: Secondary | ICD-10-CM

## 2015-11-01 MED ORDER — OXYCODONE-ACETAMINOPHEN 10-325 MG PO TABS: 1.0000 | ORAL_TABLET | Freq: Four times a day (QID) | ORAL | Status: DC | PRN

## 2015-11-01 MED ORDER — FENTANYL 12 MCG/HR TD PT72: 12.5000 ug | MEDICATED_PATCH | TRANSDERMAL | Status: DC

## 2015-11-01 NOTE — Progress Notes (Signed)
Subjective:    Patient ID:   James Hamilton, male     DOB: [birth year], 29 y.o.    MRN: [id number]  HPI James Hamilton, 29 year old male with a history of sickle cell anemia, HbSS presents for a 1 month follow up of sickle cell anemia and medication management. Patient states that he has been taking Hydroxyurea consistently.  Patient states that he has been doing well on hydrea therapy. He reports that he is having back and lower extremity pain today. He says that current pain intensity is 5/10 described as intermittent, localized, and throbbing. He maintains that he last had Oxycodone around 2 am and is currently wearing a fentanyl patch.  James Hamilton denies fatigue, shortness of breath, chest pain, nausea, vomiting, diarrhea, or dysuria.   Past Medical History  Diagnosis Date  . Sickle cell disease ([place])   . Sickle cell anemia ([facility])   . Closed TBI (traumatic brain injury) ([place]) m-3  . Fracture of femur ([facility]) m-2    Multiple femoral fractures of RLE. S/P repair   Social History   Social History  . Marital Status: Single    Spouse Name: N/A  . Number of Children: N/A  . Years of Education: N/A   Occupational History  . Not on file.   Social History Main Topics  . Smoking status: Light Tobacco Smoker    Types: Cigarettes  . Smokeless tobacco: Not on file     Comment: Pt states he smokes loose cigarettes (2) at the most  . Alcohol Use: No  . Drug Use: No  . Sexual Activity: Yes    Birth Control/ Protection: None   Other Topics Concern  . Not on file   Social History Narrative   ** Merged History Encounter **       Immunization History  Administered Date(s) Administered  . Influenza,inj,Quad PF,36+ Mos 08/30/2014, 08/04/2015  . Meningococcal Conjugate 05/02/2015  . Pneumococcal Conjugate-13 01/19/2015  . Pneumococcal Polysaccharide-23 04/22/2012  . Tdap 12/16/2013   Review of Systems  Constitutional: Positive for fatigue.  HENT: Negative.   Eyes:  Negative.   Respiratory: Negative.   Cardiovascular: Negative.   Gastrointestinal: Negative.   Endocrine: Negative.   Genitourinary: Negative.   Musculoskeletal: Positive for myalgias (right leg pain) and back pain.  Skin: Negative.   Allergic/Immunologic: Negative.   Neurological: Negative.  Negative for tremors and weakness.  Hematological: Negative.   Psychiatric/Behavioral: Negative.  Negative for suicidal ideas and sleep disturbance.       Objective:   Physical Exam  Constitutional: He is oriented to person, place, and time. He appears well-developed and well-nourished.  HENT:  Head: Normocephalic and atraumatic.  Right Ear: External ear normal.  Left Ear: External ear normal.  Mouth/Throat: Oropharynx is clear and moist.  Eyes: Conjunctivae and EOM are normal. Pupils are equal, round, and reactive to light.  Neck: Normal range of motion. Neck supple.  Cardiovascular: Normal rate, regular rhythm, normal heart sounds and intact distal pulses.   Pulmonary/Chest: Effort normal and breath sounds normal.  Abdominal: Soft. Bowel sounds are normal.  Musculoskeletal:       Lumbar back: He exhibits decreased range of motion. He exhibits no swelling.  Neurological: He is alert and oriented to person, place, and time. He has normal reflexes.  Skin: Skin is warm and dry.  Psychiatric: He has a normal mood and affect. His behavior is normal. Judgment and thought content normal.  BP 129/74 mmHg  Pulse 74  Temp(Src) 98 F (36.7 C) (Oral)  Resp 16  Ht 6\' 2"  (1.88 m)  Wt 145 lb (65.772 kg)  BMI 18.61 kg/m2 Assessment & Plan:  1. Hb-SS disease without crisis James Hamilton will restart Hydrea 500 mg daily, checked ANC, hemoglobin, and platelet count. . Checked urine for proteinuria, not present. We discussed the need to continue  hydrea therapy. He states that he has not picked up refill of Hydrea. Discussed the importance of continuity at length.  Discussed the need for good  hydration, monitoring of hydration status, avoidance of heat, cold, stress, and infection triggers. We discussed the risks and benefits of Hydrea, including bone marrow suppression, the possibility of GI upset, skin ulcers, hair thinning, and teratogenicity. The patient was reminded of the need to seek medical attention of any symptoms of bleeding, anemia, or infection. Continue folic acid 1 mg daily to prevent aplastic bone marrow crises.   - oxyCODONE-acetaminophen (PERCOCET) 10-325 MG tablet; Take 1 tablet by mouth every 6 (six) hours as needed for pain.  Dispense: 90 tablet; Refill: 0 - fentaNYL (DURAGESIC - DOSED MCG/HR) 12 MCG/HR; Place 1 patch (12.5 mcg total) onto the skin every 3 (three) days.  Dispense: 10 patch; Refill: 0  2. Chronic pain Acute and chronic painful episodes - James Hamilton and I discussed that we will continue Percocet 10-325 mg every 6 hours as needed for moderate to severe pain during the winter months. Reviewed [place] Substance Reporting system prior to reorder of pain medications, no inconsistencies noted.   Patient understands that he is to receive his Schedule II prescriptions only from [place].He is also aware that his prescription history is available to [place] online through the [place]. Controlled substance agreement signed on 04/01/2015.  We reminded James Hamilton that all patients receiving Schedule II narcotics must be seen for follow up monthly. We reviewed the terms of our pain agreement, including the need to keep medicines in a safe locked location away from children or pets, and the need to report excess sedation or constipation, measures to avoid constipation, and policies related to early refills and stolen prescriptions. According to the [place] Chronic Pain Initiative program, we have reviewed details related to analgesia, adverse effects, aberrant behaviors. Reviewed [place] Substance Reporting system prior to prescribing opiate medications, no inconsistencies noted.   3. Tobacco  abuse Smoking cessation instruction/counseling given:  counseled patient on the dangers of tobacco use, advised patient to stop smoking, and reviewed strategies to maximize success   RTC: 1 months for sickle cell and medication management. Will check CBC w/differential and reticulocyte count at 1 month follow-up.  Dorena Dew, FNP

## 2015-11-01 NOTE — Patient Instructions (Signed)
Sickle Cell Anemia, Adult Sickle cell anemia is a condition in which red blood cells have an abnormal "sickle" shape. This abnormal shape shortens the cells' life span, which results in a lower than normal concentration of red blood cells in the blood. The sickle shape also causes the cells to clump together and block free blood flow through the blood vessels. As a result, the tissues and organs of the body do not receive enough oxygen. Sickle cell anemia causes organ damage and pain and increases the risk of infection. CAUSES  Sickle cell anemia is a genetic disorder. Those who receive two copies of the gene have the condition, and those who receive one copy have the trait. RISK FACTORS The sickle cell gene is most common in people whose families originated in [place]. Other areas of the globe where sickle cell trait occurs include the Mediterranean, [place], the [place], and the [place].  SIGNS AND SYMPTOMS  Pain, especially in the extremities, back, chest, or abdomen (common). The pain may start suddenly or may develop following an illness, especially if there is dehydration. Pain can also occur due to overexertion or exposure to extreme temperature changes.  Frequent severe bacterial infections, especially certain types of pneumonia and meningitis.  Pain and swelling in the hands and feet.  Decreased activity.   Loss of appetite.   Change in behavior.  Headaches.  Seizures.  Shortness of breath or difficulty breathing.  Vision changes.  Skin ulcers. Those with the trait may not have symptoms or they may have mild symptoms.  DIAGNOSIS  Sickle cell anemia is diagnosed with blood tests that demonstrate the genetic trait. It is often diagnosed during the newborn period, due to mandatory testing nationwide. A variety of blood tests, X-rays, CT scans, MRI scans, ultrasounds, and lung function tests may also be done to monitor the condition. TREATMENT  Sickle  cell anemia may be treated with:  Medicines. You may be given pain medicines, antibiotic medicines (to treat and prevent infections) or medicines to increase the production of certain types of hemoglobin.  Fluids.  Oxygen.  Blood transfusions. HOME CARE INSTRUCTIONS   Drink enough fluid to keep your urine clear or pale yellow. Increase your fluid intake in hot weather and during exercise.  Do not smoke. Smoking lowers oxygen levels in the blood.   Only take over-the-counter or prescription medicines for pain, fever, or discomfort as directed by your health care provider.  Take antibiotics as directed by your health care provider. Make sure you finish them it even if you start to feel better.   Take supplements as directed by your health care provider.   Consider wearing a medical alert bracelet. This tells anyone caring for you in an emergency of your condition.   When traveling, keep your medical information, health care provider's names, and the medicines you take with you at all times.   If you develop a fever, do not take medicines to reduce the fever right away. This could cover up a problem that is developing. Notify your health care provider.  Keep all follow-up appointments with your health care provider. Sickle cell anemia requires regular medical care. SEEK MEDICAL CARE IF: You have a fever. SEEK IMMEDIATE MEDICAL CARE IF:   You feel dizzy or faint.   You have new abdominal pain, especially on the left side near the stomach area.   You develop a persistent, often uncomfortable and painful penile erection (priapism). If this is not treated immediately it   will lead to impotence.   You have numbness your arms or legs or you have a hard time moving them.   You have a hard time with speech.   You have a fever or persistent symptoms for more than 2-3 days.   You have a fever and your symptoms suddenly get worse.   You have signs or symptoms of infection.  These include:   Chills.   Abnormal tiredness (lethargy).   Irritability.   Poor eating.   Vomiting.   You develop pain that is not helped with medicine.   You develop shortness of breath.  You have pain in your chest.   You are coughing up pus-like or bloody sputum.   You develop a stiff neck.  Your feet or hands swell or have pain.  Your abdomen appears bloated.  You develop joint pain. MAKE SURE YOU:  Understand these instructions.   This information is not intended to replace advice given to you by your health care provider. Make sure you discuss any questions you have with your health care provider.   Document Released: 02/06/2006 Document Revised: 11/19/2014 Document Reviewed: 06/10/2013 Elsevier Interactive Patient Education 2016 Elsevier Inc. Sickle Cell Anemia, Adult Sickle cell anemia is a condition in which red blood cells have an abnormal "sickle" shape. This abnormal shape shortens the cells' life span, which results in a lower than normal concentration of red blood cells in the blood. The sickle shape also causes the cells to clump together and block free blood flow through the blood vessels. As a result, the tissues and organs of the body do not receive enough oxygen. Sickle cell anemia causes organ damage and pain and increases the risk of infection. CAUSES  Sickle cell anemia is a genetic disorder. Those who receive two copies of the gene have the condition, and those who receive one copy have the trait. RISK FACTORS The sickle cell gene is most common in people whose families originated in [place]. Other areas of the globe where sickle cell trait occurs include the [place], [place] and [place], and the [place].  SIGNS AND SYMPTOMS  Pain, especially in the extremities, back, chest, or abdomen (common). The pain may start suddenly or may develop following an illness, especially if there is dehydration. Pain can also  occur due to overexertion or exposure to extreme temperature changes.  Frequent severe bacterial infections, especially certain types of pneumonia and meningitis.  Pain and swelling in the hands and feet.  Decreased activity.   Loss of appetite.   Change in behavior.  Headaches.  Seizures.  Shortness of breath or difficulty breathing.  Vision changes.  Skin ulcers. Those with the trait may not have symptoms or they may have mild symptoms.  DIAGNOSIS  Sickle cell anemia is diagnosed with blood tests that demonstrate the genetic trait. It is often diagnosed during the newborn period, due to mandatory testing nationwide. A variety of blood tests, X-rays, CT scans, MRI scans, ultrasounds, and lung function tests may also be done to monitor the condition. TREATMENT  Sickle cell anemia may be treated with:  Medicines. You may be given pain medicines, antibiotic medicines (to treat and prevent infections) or medicines to increase the production of certain types of hemoglobin.  Fluids.  Oxygen.  Blood transfusions. HOME CARE INSTRUCTIONS   Drink enough fluid to keep your urine clear or pale yellow. Increase your fluid intake in hot weather and during exercise.  Do not smoke. Smoking lowers oxygen levels  in the blood.   Only take over-the-counter or prescription medicines for pain, fever, or discomfort as directed by your health care provider.  Take antibiotics as directed by your health care provider. Make sure you finish them it even if you start to feel better.   Take supplements as directed by your health care provider.   Consider wearing a medical alert bracelet. This tells anyone caring for you in an emergency of your condition.   When traveling, keep your medical information, health care provider's names, and the medicines you take with you at all times.   If you develop a fever, do not take medicines to reduce the fever right away. This could cover up a problem  that is developing. Notify your health care provider.  Keep all follow-up appointments with your health care provider. Sickle cell anemia requires regular medical care. SEEK MEDICAL CARE IF: You have a fever. SEEK IMMEDIATE MEDICAL CARE IF:   You feel dizzy or faint.   You have new abdominal pain, especially on the left side near the stomach area.   You develop a persistent, often uncomfortable and painful penile erection (priapism). If this is not treated immediately it will lead to impotence.   You have numbness your arms or legs or you have a hard time moving them.   You have a hard time with speech.   You have a fever or persistent symptoms for more than 2-3 days.   You have a fever and your symptoms suddenly get worse.   You have signs or symptoms of infection. These include:   Chills.   Abnormal tiredness (lethargy).   Irritability.   Poor eating.   Vomiting.   You develop pain that is not helped with medicine.   You develop shortness of breath.  You have pain in your chest.   You are coughing up pus-like or bloody sputum.   You develop a stiff neck.  Your feet or hands swell or have pain.  Your abdomen appears bloated.  You develop joint pain. MAKE SURE YOU:  Understand these instructions.   This information is not intended to replace advice given to you by your health care provider. Make sure you discuss any questions you have with your health care provider.   Document Released: 02/06/2006 Document Revised: 11/19/2014 Document Reviewed: 06/10/2013 Elsevier Interactive Patient Education 2016 Elsevier Inc. Sickle Cell Anemia, Adult Sickle cell anemia is a condition in which red blood cells have an abnormal "sickle" shape. This abnormal shape shortens the cells' life span, which results in a lower than normal concentration of red blood cells in the blood. The sickle shape also causes the cells to clump together and block free blood flow  through the blood vessels. As a result, the tissues and organs of the body do not receive enough oxygen. Sickle cell anemia causes organ damage and pain and increases the risk of infection. CAUSES  Sickle cell anemia is a genetic disorder. Those who receive two copies of the gene have the condition, and those who receive one copy have the trait. RISK FACTORS The sickle cell gene is most common in people whose families originated in [place]. Other areas of the globe where sickle cell trait occurs include the [place], [place] and [place], and the [place].  SIGNS AND SYMPTOMS  Pain, especially in the extremities, back, chest, or abdomen (common). The pain may start suddenly or may develop following an illness, especially if there is dehydration. Pain can also  occur due to overexertion or exposure to extreme temperature changes.  Frequent severe bacterial infections, especially certain types of pneumonia and meningitis.  Pain and swelling in the hands and feet.  Decreased activity.   Loss of appetite.   Change in behavior.  Headaches.  Seizures.  Shortness of breath or difficulty breathing.  Vision changes.  Skin ulcers. Those with the trait may not have symptoms or they may have mild symptoms.  DIAGNOSIS  Sickle cell anemia is diagnosed with blood tests that demonstrate the genetic trait. It is often diagnosed during the newborn period, due to mandatory testing nationwide. A variety of blood tests, X-rays, CT scans, MRI scans, ultrasounds, and lung function tests may also be done to monitor the condition. TREATMENT  Sickle cell anemia may be treated with:  Medicines. You may be given pain medicines, antibiotic medicines (to treat and prevent infections) or medicines to increase the production of certain types of hemoglobin.  Fluids.  Oxygen.  Blood transfusions. HOME CARE INSTRUCTIONS   Drink enough fluid to keep your urine clear or pale yellow.  Increase your fluid intake in hot weather and during exercise.  Do not smoke. Smoking lowers oxygen levels in the blood.   Only take over-the-counter or prescription medicines for pain, fever, or discomfort as directed by your health care provider.  Take antibiotics as directed by your health care provider. Make sure you finish them it even if you start to feel better.   Take supplements as directed by your health care provider.   Consider wearing a medical alert bracelet. This tells anyone caring for you in an emergency of your condition.   When traveling, keep your medical information, health care provider's names, and the medicines you take with you at all times.   If you develop a fever, do not take medicines to reduce the fever right away. This could cover up a problem that is developing. Notify your health care provider.  Keep all follow-up appointments with your health care provider. Sickle cell anemia requires regular medical care. SEEK MEDICAL CARE IF: You have a fever. SEEK IMMEDIATE MEDICAL CARE IF:   You feel dizzy or faint.   You have new abdominal pain, especially on the left side near the stomach area.   You develop a persistent, often uncomfortable and painful penile erection (priapism). If this is not treated immediately it will lead to impotence.   You have numbness your arms or legs or you have a hard time moving them.   You have a hard time with speech.   You have a fever or persistent symptoms for more than 2-3 days.   You have a fever and your symptoms suddenly get worse.   You have signs or symptoms of infection. These include:   Chills.   Abnormal tiredness (lethargy).   Irritability.   Poor eating.   Vomiting.   You develop pain that is not helped with medicine.   You develop shortness of breath.  You have pain in your chest.   You are coughing up pus-like or bloody sputum.   You develop a stiff neck.  Your feet  or hands swell or have pain.  Your abdomen appears bloated.  You develop joint pain. MAKE SURE YOU:  Understand these instructions.   This information is not intended to replace advice given to you by your health care provider. Make sure you discuss any questions you have with your health care provider.   Document Released: 02/06/2006 Document Revised: 11/19/2014  Document Reviewed: 06/10/2013 Elsevier Interactive Patient Education 2016 Elsevier Inc. Chronic Pain Chronic pain can be defined as pain that is off and on and lasts for 3-6 months or longer. Many things cause chronic pain, which can make it difficult to make a diagnosis. There are many treatment options available for chronic pain. However, finding a treatment that works well for you may require trying various approaches until the right one is found. Many people benefit from a combination of two or more types of treatment to control their pain. SYMPTOMS  Chronic pain can occur anywhere in the body and can range from mild to very severe. Some types of chronic pain include:  Headache.  Low back pain.  Cancer pain.  Arthritis pain.  Neurogenic pain. This is pain resulting from damage to nerves. People with chronic pain may also have other symptoms such as:  Depression.  Anger.  Insomnia.  Anxiety. DIAGNOSIS  Your health care provider will help diagnose your condition over time. In many cases, the initial focus will be on excluding possible conditions that could be causing the pain. Depending on your symptoms, your health care provider may order tests to diagnose your condition. Some of these tests may include:   Blood tests.   CT scan.   MRI.   X-rays.   Ultrasounds.   Nerve conduction studies.  You may need to see a specialist.  TREATMENT  Finding treatment that works well may take time. You may be referred to a pain specialist. He or she may prescribe medicine or therapies, such as:   Mindful  meditation or yoga.  Shots (injections) of numbing or pain-relieving medicines into the spine or area of pain.  Local electrical stimulation.  Acupuncture.   Massage therapy.   Aroma, color, light, or sound therapy.   Biofeedback.   Working with a physical therapist to keep from getting stiff.   Regular, gentle exercise.   Cognitive or behavioral therapy.   Group support.  Sometimes, surgery may be recommended.  HOME CARE INSTRUCTIONS   Take all medicines as directed by your health care provider.   Lessen stress in your life by relaxing and doing things such as listening to calming music.   Exercise or be active as directed by your health care provider.   Eat a healthy diet and include things such as vegetables, fruits, fish, and lean meats in your diet.   Keep all follow-up appointments with your health care provider.   Attend a support group with others suffering from chronic pain. SEEK MEDICAL CARE IF:   Your pain gets worse.   You develop a new pain that was not there before.   You cannot tolerate medicines given to you by your health care provider.   You have new symptoms since your last visit with your health care provider.  SEEK IMMEDIATE MEDICAL CARE IF:   You feel weak.   You have decreased sensation or numbness.   You lose control of bowel or bladder function.   Your pain suddenly gets much worse.   You develop shaking.  You develop chills.  You develop confusion.  You develop chest pain.  You develop shortness of breath.  MAKE SURE YOU:  Understand these instructions.  Will watch your condition.  Will get help right away if you are not doing well or get worse.   This information is not intended to replace advice given to you by your health care provider. Make sure you discuss any questions you have  with your health care provider.   Document Released: 07/21/2002 Document Revised: 07/01/2013 Document Reviewed:  04/24/2013 Elsevier Interactive Patient Education [employer].

## 2015-11-02 ENCOUNTER — Encounter: Payer: Self-pay | Admitting: Family Medicine

## 2015-11-22 ENCOUNTER — Telehealth: Payer: Self-pay | Admitting: Family Medicine

## 2015-11-22 DIAGNOSIS — D571 Sickle-cell disease without crisis: Secondary | ICD-10-CM

## 2015-11-22 MED ORDER — OXYCODONE-ACETAMINOPHEN 10-325 MG PO TABS
1.0000 | ORAL_TABLET | Freq: Four times a day (QID) | ORAL | Status: DC | PRN
Start: 2015-11-22 — End: 2015-12-13

## 2015-11-22 NOTE — Telephone Encounter (Signed)
Refill request for Percocet 10/325mg . LOV 11/01/2015. Please advise. Thanks!

## 2015-11-22 NOTE — Telephone Encounter (Signed)
Reviewed [place] Substance Reporting system prior to prescribing opiate medications. No inconsistencies noted.   Meds ordered this encounter  Medications  . oxyCODONE-acetaminophen (PERCOCET) 10-325 MG tablet    Sig: Take 1 tablet by mouth every 6 (six) hours as needed for pain.    Dispense:  90 tablet    Refill:  0    Order Specific Question:   Supervising Provider    Answer:   JEGEDE, OLUGBEMIGA E [[id number]]    James Hamilton M, FNP 

## 2015-12-01 ENCOUNTER — Encounter: Payer: Self-pay | Admitting: Family Medicine

## 2015-12-01 ENCOUNTER — Ambulatory Visit (INDEPENDENT_AMBULATORY_CARE_PROVIDER_SITE_OTHER): Payer: Medicaid Other | Admitting: Family Medicine

## 2015-12-01 VITALS — BP 132/88 | HR 58 | Temp 98.0°F | Resp 14 | Ht 74.0 in | Wt 149.0 lb

## 2015-12-01 DIAGNOSIS — D571 Sickle-cell disease without crisis: Secondary | ICD-10-CM | POA: Diagnosis not present

## 2015-12-01 MED ORDER — FOLIC ACID 1 MG PO TABS: 1.0000 mg | ORAL_TABLET | Freq: Every day | ORAL | Status: DC

## 2015-12-01 NOTE — Progress Notes (Signed)
Subjective:    Patient ID:   James Hamilton, male     DOB: Nov 12, 1986, 30 y.o.    MRN: [id number]  HPI James Hamilton, 30 year old male with a history of sickle cell anemia, HbSS presents for a 1 month follow up of sickle cell anemia and medication management. Patient states that he has been taking Hydroxyurea consistently.  Patient states that he has been doing well on hydrea therapy. He reports that he is having back and lower extremity pain today. He says that current pain intensity is 4/10 described as intermittent, localized, and throbbing. He maintains that he last had Oxycodone around 6 am and is currently wearing a fentanyl patch.  James Hamilton denies fatigue, shortness of breath, chest pain, nausea, vomiting, diarrhea, or dysuria.   Past Medical History  Diagnosis Date  . Sickle cell disease ([place])   . Sickle cell anemia ([facility])   . Closed TBI (traumatic brain injury) ([place]) m-3  . Fracture of femur ([facility]) m-2    Multiple femoral fractures of RLE. S/P repair   Social History   Social History  . Marital Status: Single    Spouse Name: N/A  . Number of Children: N/A  . Years of Education: N/A   Occupational History  . Not on file.   Social History Main Topics  . Smoking status: Light Tobacco Smoker    Types: Cigarettes  . Smokeless tobacco: Not on file     Comment: Pt states he smokes loose cigarettes (2) at the most  . Alcohol Use: No  . Drug Use: No  . Sexual Activity: Yes    Birth Control/ Protection: None   Other Topics Concern  . Not on file   Social History Narrative   ** Merged History Encounter **       Immunization History  Administered Date(s) Administered  . Influenza,inj,Quad PF,36+ Mos 08/30/2014, 08/04/2015  . Meningococcal Conjugate 05/02/2015  . Pneumococcal Conjugate-13 01/19/2015  . Pneumococcal Polysaccharide-23 04/22/2012  . Tdap 12/16/2013   Review of Systems  Constitutional: Positive for fatigue.  HENT: Negative.   Eyes:  Negative.   Respiratory: Negative.   Cardiovascular: Negative.   Gastrointestinal: Negative.   Endocrine: Negative.   Genitourinary: Negative.   Musculoskeletal: Positive for myalgias (right leg pain) and back pain.  Skin: Negative.   Allergic/Immunologic: Negative.   Neurological: Negative.  Negative for tremors and weakness.  Hematological: Negative.   Psychiatric/Behavioral: Negative.  Negative for suicidal ideas and sleep disturbance.       Objective:   Physical Exam  Constitutional: He is oriented to person, place, and time. He appears well-developed and well-nourished.  HENT:  Head: Normocephalic and atraumatic.  Right Ear: External ear normal.  Left Ear: External ear normal.  Mouth/Throat: Oropharynx is clear and moist.  Eyes: Conjunctivae and EOM are normal. Pupils are equal, round, and reactive to light.  Neck: Normal range of motion. Neck supple.  Cardiovascular: Normal rate, regular rhythm, normal heart sounds and intact distal pulses.   Pulmonary/Chest: Effort normal and breath sounds normal.  Abdominal: Soft. Bowel sounds are normal.  Musculoskeletal:       Lumbar back: He exhibits decreased range of motion. He exhibits no swelling.  Neurological: He is alert and oriented to person, place, and time. He has normal reflexes.  Skin: Skin is warm and dry.  Psychiatric: He has a normal mood and affect. His behavior is normal. Judgment and thought content normal.      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 . We discussed the need to continue  hydrea therapy. Discussed the importance of continuity at length.  Discussed the need for good hydration, monitoring of hydration status, avoidance of heat, cold, stress, and infection  triggers. We discussed the risks and benefits of Hydrea, including bone marrow suppression, the possibility of GI upset, skin ulcers, hair thinning, and teratogenicity. The patient was reminded of the need to seek medical attention of any symptoms of bleeding, anemia, or infection. Continue folic acid 1 mg daily to prevent aplastic bone marrow crises.  2. Chronic pain Acute and chronic painful episodes - James Hamilton and I discussed that we will continue Percocet 10-325 mg every 6 hours as needed for moderate to severe pain during the winter months. Will also continue Fentanyl patches. Reviewed [place] Substance Reporting system prior to reorder of pain medications, no inconsistencies noted.   Patient understands that he is to receive his Schedule II prescriptions only from [place].He is also aware that his prescription history is available to [place] online through the [facility]. Controlled substance agreement signed previously.   We reminded James Hamilton that all patients receiving Schedule II narcotics must be seen for follow up monthly. We reviewed the terms of our pain agreement, including the need to keep medicines in a safe locked location away from children or pets, and the need to report excess sedation or constipation, measures to avoid constipation, and policies related to early refills and stolen prescriptions. According to the [place] [facility] program, we have reviewed details related to analgesia, adverse effects, aberrant behaviors. Reviewed [place] Substance Reporting system prior to prescribing opiate medications, no inconsistencies noted.   3. Tobacco abuse Smoking cessation instruction/counseling given:  counseled patient on the dangers of tobacco use, advised patient to stop smoking, and reviewed strategies to maximize success   RTC: 1 months for sickle cell and medication management. Will check CBC w/differential and reticulocyte count at 1 month follow-up.  Dorena Dew, FNP

## 2015-12-01 NOTE — Patient Instructions (Signed)
Sickle Cell Anemia, Adult Sickle cell anemia is a condition in which red blood cells have an abnormal "sickle" shape. This abnormal shape shortens the cells' life span, which results in a lower than normal concentration of red blood cells in the blood. The sickle shape also causes the cells to clump together and block free blood flow through the blood vessels. As a result, the tissues and organs of the body do not receive enough oxygen. Sickle cell anemia causes organ damage and pain and increases the risk of infection. CAUSES  Sickle cell anemia is a genetic disorder. Those who receive two copies of the gene have the condition, and those who receive one copy have the trait. RISK FACTORS The sickle cell gene is most common in people whose families originated in [place]. Other areas of the globe where sickle cell trait occurs include the Mediterranean, [place], the [place], and the [place].  SIGNS AND SYMPTOMS  Pain, especially in the extremities, back, chest, or abdomen (common). The pain may start suddenly or may develop following an illness, especially if there is dehydration. Pain can also occur due to overexertion or exposure to extreme temperature changes.  Frequent severe bacterial infections, especially certain types of pneumonia and meningitis.  Pain and swelling in the hands and feet.  Decreased activity.   Loss of appetite.   Change in behavior.  Headaches.  Seizures.  Shortness of breath or difficulty breathing.  Vision changes.  Skin ulcers. Those with the trait may not have symptoms or they may have mild symptoms.  DIAGNOSIS  Sickle cell anemia is diagnosed with blood tests that demonstrate the genetic trait. It is often diagnosed during the newborn period, due to mandatory testing nationwide. A variety of blood tests, X-rays, CT scans, MRI scans, ultrasounds, and lung function tests may also be done to monitor the condition. TREATMENT  Sickle  cell anemia may be treated with:  Medicines. You may be given pain medicines, antibiotic medicines (to treat and prevent infections) or medicines to increase the production of certain types of hemoglobin.  Fluids.  Oxygen.  Blood transfusions. HOME CARE INSTRUCTIONS   Drink enough fluid to keep your urine clear or pale yellow. Increase your fluid intake in hot weather and during exercise.  Do not smoke. Smoking lowers oxygen levels in the blood.   Only take over-the-counter or prescription medicines for pain, fever, or discomfort as directed by your health care provider.  Take antibiotics as directed by your health care provider. Make sure you finish them it even if you start to feel better.   Take supplements as directed by your health care provider.   Consider wearing a medical alert bracelet. This tells anyone caring for you in an emergency of your condition.   When traveling, keep your medical information, health care provider's names, and the medicines you take with you at all times.   If you develop a fever, do not take medicines to reduce the fever right away. This could cover up a problem that is developing. Notify your health care provider.  Keep all follow-up appointments with your health care provider. Sickle cell anemia requires regular medical care. SEEK MEDICAL CARE IF: You have a fever. SEEK IMMEDIATE MEDICAL CARE IF:   You feel dizzy or faint.   You have new abdominal pain, especially on the left side near the stomach area.   You develop a persistent, often uncomfortable and painful penile erection (priapism). If this is not treated immediately it   will lead to impotence.   You have numbness your arms or legs or you have a hard time moving them.   You have a hard time with speech.   You have a fever or persistent symptoms for more than 2-3 days.   You have a fever and your symptoms suddenly get worse.   You have signs or symptoms of infection.  These include:   Chills.   Abnormal tiredness (lethargy).   Irritability.   Poor eating.   Vomiting.   You develop pain that is not helped with medicine.   You develop shortness of breath.  You have pain in your chest.   You are coughing up pus-like or bloody sputum.   You develop a stiff neck.  Your feet or hands swell or have pain.  Your abdomen appears bloated.  You develop joint pain. MAKE SURE YOU:  Understand these instructions.   This information is not intended to replace advice given to you by your health care provider. Make sure you discuss any questions you have with your health care provider.   Document Released: 02/06/2006 Document Revised: 11/19/2014 Document Reviewed: 06/10/2013 Elsevier Interactive Patient Education 2016 Elsevier Inc.  

## 2015-12-12 ENCOUNTER — Telehealth: Payer: Self-pay | Admitting: Internal Medicine

## 2015-12-12 DIAGNOSIS — D571 Sickle-cell disease without crisis: Secondary | ICD-10-CM

## 2015-12-12 NOTE — Telephone Encounter (Signed)
Refill request for percocet 10/325mg . LOV 12/01/2015. Please advise. Thanks!

## 2015-12-13 MED ORDER — OXYCODONE-ACETAMINOPHEN 10-325 MG PO TABS: 1.0000 | ORAL_TABLET | Freq: Four times a day (QID) | ORAL | Status: DC | PRN

## 2015-12-13 NOTE — Telephone Encounter (Signed)
Reviewed [place] Substance Reporting system prior to prescribing opiate medications, no inconsistencies noted.   Meds ordered this encounter  Medications  . oxyCODONE-acetaminophen (PERCOCET) 10-325 MG tablet    Sig: Take 1 tablet by mouth every 6 (six) hours as needed for pain.    Dispense:  90 tablet    Refill:  0    Order Specific Question:  Supervising Provider    Answer:  [name] [id number]     Dorena Dew, FNP

## 2015-12-26 ENCOUNTER — Telehealth: Payer: Self-pay | Admitting: Internal Medicine

## 2015-12-26 DIAGNOSIS — D571 Sickle-cell disease without crisis: Secondary | ICD-10-CM

## 2015-12-26 NOTE — Telephone Encounter (Signed)
Refill request for fentanyl patches, LOV 12/01/2015. Please advise. Thanks!

## 2015-12-27 MED ORDER — FENTANYL 12 MCG/HR TD PT72: 12.5000 ug | MEDICATED_PATCH | TRANSDERMAL | Status: DC

## 2015-12-27 NOTE — Telephone Encounter (Signed)
Reviewed  Substance Reporting system prior to prescribing opiate medications.  Meds ordered this encounter  Medications  . fentaNYL (DURAGESIC - DOSED MCG/HR) 12 MCG/HR    Sig: Place 1 patch (12.5 mcg total) onto the skin every 3 (three) days.    Dispense:  10 patch    Refill:  0    Order Specific Question:  Supervising Provider    Answer:  [name] [id number]    Dorena Dew, FNP

## 2016-01-02 ENCOUNTER — Ambulatory Visit: Payer: Medicaid Other | Admitting: Family Medicine

## 2016-01-02 ENCOUNTER — Telehealth: Payer: Self-pay

## 2016-01-02 DIAGNOSIS — D571 Sickle-cell disease without crisis: Secondary | ICD-10-CM

## 2016-01-02 NOTE — Telephone Encounter (Signed)
Refill request for Percocet 10/325mg . LOV 12/01/2015. Please advise. Thanks!

## 2016-01-03 MED ORDER — OXYCODONE-ACETAMINOPHEN 10-325 MG PO TABS: 1.0000 | ORAL_TABLET | Freq: Four times a day (QID) | ORAL | Status: DC | PRN

## 2016-01-03 NOTE — Telephone Encounter (Signed)
Reviewed [place] Substance Reporting system prior to prescribing opiate medications. No inconsistencies noted.   Meds ordered this encounter  Medications  . oxyCODONE-acetaminophen (PERCOCET) 10-325 MG tablet    Sig: Take 1 tablet by mouth every 6 (six) hours as needed for pain.    Dispense:  90 tablet    Refill:  0    Order Specific Question:   Supervising Provider    Answer:   JEGEDE, OLUGBEMIGA E [id number]    Mykelti Goldenstein M, FNP 

## 2016-01-10 ENCOUNTER — Ambulatory Visit: Payer: Medicaid Other | Admitting: Family Medicine

## 2016-01-11 ENCOUNTER — Ambulatory Visit: Payer: Medicaid Other | Admitting: Family Medicine

## 2016-01-17 ENCOUNTER — Encounter: Payer: Medicaid Other | Attending: Physical Medicine & Rehabilitation | Admitting: Physical Medicine & Rehabilitation

## 2016-01-17 DIAGNOSIS — D571 Sickle-cell disease without crisis: Secondary | ICD-10-CM | POA: Insufficient documentation

## 2016-01-17 DIAGNOSIS — S728X1A Other fracture of right femur, initial encounter for closed fracture: Secondary | ICD-10-CM | POA: Insufficient documentation

## 2016-01-17 DIAGNOSIS — S069X0A Unspecified intracranial injury without loss of consciousness, initial encounter: Secondary | ICD-10-CM | POA: Insufficient documentation

## 2016-01-17 DIAGNOSIS — F17210 Nicotine dependence, cigarettes, uncomplicated: Secondary | ICD-10-CM | POA: Insufficient documentation

## 2016-01-20 ENCOUNTER — Ambulatory Visit (INDEPENDENT_AMBULATORY_CARE_PROVIDER_SITE_OTHER): Payer: Medicaid Other | Admitting: Family Medicine

## 2016-01-20 ENCOUNTER — Encounter: Payer: Self-pay | Admitting: Family Medicine

## 2016-01-20 VITALS — BP 136/78 | HR 67 | Temp 97.7°F | Resp 14 | Ht 74.0 in | Wt 140.0 lb

## 2016-01-20 DIAGNOSIS — G8929 Other chronic pain: Secondary | ICD-10-CM

## 2016-01-20 DIAGNOSIS — S728X1A Other fracture of right femur, initial encounter for closed fracture: Secondary | ICD-10-CM | POA: Diagnosis not present

## 2016-01-20 DIAGNOSIS — Z720 Tobacco use: Secondary | ICD-10-CM | POA: Diagnosis not present

## 2016-01-20 DIAGNOSIS — S069X0A Unspecified intracranial injury without loss of consciousness, initial encounter: Secondary | ICD-10-CM | POA: Diagnosis not present

## 2016-01-20 DIAGNOSIS — D571 Sickle-cell disease without crisis: Secondary | ICD-10-CM | POA: Diagnosis not present

## 2016-01-20 DIAGNOSIS — F17210 Nicotine dependence, cigarettes, uncomplicated: Secondary | ICD-10-CM | POA: Diagnosis not present

## 2016-01-20 LAB — COMPLETE METABOLIC PANEL WITH GFR
ALK PHOS: 78 U/L (ref 40–115)
ALT: 8 U/L — ABNORMAL LOW (ref 9–46)
AST: 12 U/L (ref 10–40)
Albumin: 4.2 g/dL (ref 3.6–5.1)
BUN: 7 mg/dL (ref 7–25)
CHLORIDE: 104 mmol/L (ref 98–110)
CO2: 26 mmol/L (ref 20–31)
Calcium: 9.4 mg/dL (ref 8.6–10.3)
Creat: 0.93 mg/dL (ref 0.60–1.35)
GFR, Est African American: >89 mL/min (ref >=60)
GLUCOSE: 73 mg/dL (ref 65–99)
POTASSIUM: 4.1 mmol/L (ref 3.5–5.3)
SODIUM: 139 mmol/L (ref 135–146)
Total Bilirubin: 0.3 mg/dL (ref 0.2–1.2)
Total Protein: 7.4 g/dL (ref 6.1–8.1)

## 2016-01-20 LAB — CBC WITH DIFFERENTIAL/PLATELET
BASOS PCT: 0 % (ref 0–1)
Basophils Absolute: 0 10*3/uL (ref 0.0–0.1)
EOS ABS: 0.1 10*3/uL (ref 0.0–0.7)
Eosinophils Relative: 1 % (ref 0–5)
HCT: 44.2 % (ref 39.0–52.0)
Hemoglobin: 14.4 g/dL (ref 13.0–17.0)
LYMPHS PCT: 21 % (ref 12–46)
Lymphs Abs: 1.7 10*3/uL (ref 0.7–4.0)
MCH: 26.2 pg (ref 26.0–34.0)
MCHC: 32.6 g/dL (ref 30.0–36.0)
MCV: 80.5 fL (ref 78.0–100.0)
MONO ABS: 0.3 10*3/uL (ref 0.1–1.0)
MONOS PCT: 4 % (ref 3–12)
MPV: 9.6 fL (ref 8.6–12.4)
Neutro Abs: 6.1 10*3/uL (ref 1.7–7.7)
Neutrophils Relative %: 74 % (ref 43–77)
PLATELETS: 241 10*3/uL (ref 150–400)
RBC: 5.49 MIL/uL (ref 4.22–5.81)
RDW: 16.9 % — AB (ref 11.5–15.5)
WBC: 8.3 10*3/uL (ref 4.0–10.5)

## 2016-01-20 LAB — POCT URINALYSIS DIP (DEVICE)
BILIRUBIN URINE: NEGATIVE
Glucose, UA: NEGATIVE mg/dL
Hgb urine dipstick: NEGATIVE
KETONES UR: NEGATIVE mg/dL
LEUKOCYTES UA: NEGATIVE
NITRITE: NEGATIVE
PH: 7 (ref 5.0–8.0)
Protein, ur: NEGATIVE mg/dL
Specific Gravity, Urine: 1.015 (ref 1.005–1.030)
UROBILINOGEN UA: 0.2 mg/dL (ref 0.0–1.0)

## 2016-01-20 LAB — RETICULOCYTES
ABS RETIC: 49.4 10*3/uL (ref 19.0–186.0)
RBC.: 5.49 MIL/uL (ref 4.22–5.81)
Retic Ct Pct: 0.9 % (ref 0.4–2.3)

## 2016-01-20 MED ORDER — OXYCODONE-ACETAMINOPHEN 10-325 MG PO TABS: 1.0000 | ORAL_TABLET | Freq: Four times a day (QID) | ORAL | Status: DC | PRN

## 2016-01-20 MED ORDER — HYDROXYUREA 500 MG PO CAPS: 500.0000 mg | ORAL_CAPSULE | Freq: Every day | ORAL | Status: DC

## 2016-01-20 MED ORDER — FENTANYL 12 MCG/HR TD PT72
12.5000 ug | MEDICATED_PATCH | TRANSDERMAL | Status: DC
Start: 2016-01-20 — End: 2016-02-24

## 2016-01-20 NOTE — Progress Notes (Signed)
Subjective:    Patient ID:   James Hamilton, male     DOB: 07-30-1986, 30 y.o.    MRN: [id number]  HPI James Hamilton, 30 year old male with a history of sickle cell anemia, HbSS presents for a 1 month follow up of sickle cell anemia and medication management. Patient states that he has been taking all medications consistently.  Patient states that he has been doing well on hydrea therapy. He reports that he is having back and lower extremity pain today. He says that current pain intensity is 6/10 described as intermittent, localized, and throbbing. He maintains that he last had Oxycodone around 5 am and is currently wearing a fentanyl patch.  James Hamilton denies fatigue, shortness of breath, chest pain,  Dysuria, nausea, vomiting, diarrhea, or dysuria.   Past Medical History  Diagnosis Date  . Sickle cell disease ([place])   . Sickle cell anemia ([facility])   . Closed TBI (traumatic brain injury) ([place]) m-3  . Fracture of femur ([facility]) m-2    Multiple femoral fractures of RLE. S/P repair   Social History   Social History  . Marital Status: Single    Spouse Name: N/A  . Number of Children: N/A  . Years of Education: N/A   Occupational History  . Not on file.   Social History Main Topics  . Smoking status: Light Tobacco Smoker    Types: Cigarettes  . Smokeless tobacco: Not on file     Comment: Pt states he smokes loose cigarettes (2) at the most  . Alcohol Use: No  . Drug Use: No  . Sexual Activity: Yes    Birth Control/ Protection: None   Other Topics Concern  . Not on file   Social History Narrative   ** Merged History Encounter **       Immunization History  Administered Date(s) Administered  . Influenza,inj,Quad PF,36+ Mos 08/30/2014, 08/04/2015  . Meningococcal Conjugate 05/02/2015  . Pneumococcal Conjugate-13 01/19/2015  . Pneumococcal Polysaccharide-23 04/22/2012  . Tdap 12/16/2013   Review of Systems  Constitutional: Positive for fatigue.  HENT: Negative.    Eyes: Negative.   Respiratory: Negative.   Cardiovascular: Negative.   Gastrointestinal: Negative.  Negative for abdominal pain and anal bleeding.  Endocrine: Negative.  Negative for polydipsia, polyphagia and polyuria.  Genitourinary: Negative.   Musculoskeletal: Positive for myalgias (right leg pain) and back pain.  Skin: Negative.   Allergic/Immunologic: Negative.   Neurological: Negative.  Negative for tremors and weakness.  Hematological: Negative.   Psychiatric/Behavioral: Negative.  Negative for suicidal ideas and sleep disturbance.       Objective:   Physical Exam  Constitutional: He is oriented to person, place, and time. He appears well-developed and well-nourished.  HENT:  Head: Normocephalic and atraumatic.  Right Ear: External ear normal.  Left Ear: External ear normal.  Mouth/Throat: Oropharynx is clear and moist.  Eyes: Conjunctivae and EOM are normal. Pupils are equal, round, and reactive to light.  Neck: Normal range of motion. Neck supple.  Cardiovascular: Normal rate, regular rhythm, normal heart sounds and intact distal pulses.   Pulmonary/Chest: Effort normal and breath sounds normal.  Abdominal: Soft. Bowel sounds are normal.  Musculoskeletal:       Lumbar back: He exhibits decreased range of motion. He exhibits no swelling.  Poor posture noted  Neurological: He is alert and oriented to person, place, and time. He has normal reflexes.  Skin: Skin is warm and dry.  Psychiatric: He has a normal  mood and affect. His behavior is normal. Judgment and thought content normal.      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 . We discussed the need to continue  hydrea therapy. Discussed the importance  of continuity at length.  Discussed the need for good hydration, monitoring of hydration status, avoidance of heat, cold, stress, and infection triggers. We discussed the risks and benefits of Hydrea, including bone marrow suppression, the possibility of GI upset, skin ulcers, hair thinning, and teratogenicity. The patient was reminded of the need to seek medical attention of any symptoms of bleeding, anemia, or infection. Continue folic acid 1 mg daily to prevent aplastic bone marrow crises.   Pulmonary evaluation - Patient denies severe recurrent wheezes, shortness of breath with exercise, or persistent cough. If these symptoms develop, pulmonary function tests with spirometry will be ordered, and if abnormal, plan on referral to Pulmonology for further evaluation.  Cardiac - Routine screening for pulmonary hypertension is not recommended.  Eye - High risk of proliferative retinopathy. Up to date with eye examination.   Immunization status - Up to date with immunizations.   Reviewed [place] Substance Reporting system prior to prescribing opiate medications, no inconsistencies noted  - Prescription Monitoring Profile (17)-[facility] - POCT urinalysis dipstick - CBC with Differential - COMPLETE METABOLIC PANEL WITH GFR - Reticulocytes - Vitamin D, 25-hydroxy - hydroxyurea (HYDREA) 500 MG capsule; Take 1 capsule (500 mg total) by mouth daily. May take with food to minimize GI side effects.  Dispense: 30 capsule; Refill: 5 - oxyCODONE-acetaminophen (PERCOCET) 10-325 MG tablet; Take 1 tablet by mouth every 6 (six) hours as needed for pain.  Dispense: 90 tablet; Refill: 0 - fentaNYL (DURAGESIC - DOSED MCG/HR) 12 MCG/HR; Place 1 patch (12.5 mcg total) onto the skin every 3 (three) days.  Dispense: 10 patch; Refill: 0  2. Chronic pain Acute and chronic painful episodes - James Hamilton and I discussed that we will continue Percocet 10-325 mg every 6 hours as needed for moderate to severe pain during the  winter months. Will also continue Fentanyl patches. Reviewed [place] Substance Reporting system prior to reorder of pain medications, no inconsistencies noted. Patient understands that he is to receive his Schedule II prescriptions only from [place].He is also aware that his prescription history is available to [place] online through the [place]. Controlled substance agreement signed previously. We reminded James Hamilton that all patients receiving Schedule II narcotics must be seen for follow up monthly. We reviewed the terms of our pain agreement, including the need to keep medicines in a safe locked location away from children or pets, and the need to report excess sedation or constipation, measures to avoid constipation, and policies related to early refills and stolen prescriptions. According to the [place] [facility] program, we have reviewed details related to analgesia, adverse effects, aberrant behaviors. Reviewed  Substance Reporting system prior to prescribing opiate medications, no inconsistencies noted.  - Prescription Monitoring Profile (17)-[facility]  3. Tobacco abuse Smoking cessation instruction/counseling given:  counseled patient on the dangers of tobacco use, advised patient to stop smoking, and reviewed strategies to maximize success    RTC: 1 months for sickle cell and medication management. Will follow  up by phone with laboratory results.   Dorena Dew, FNP

## 2016-01-21 LAB — VITAMIN D 25 HYDROXY (VIT D DEFICIENCY, FRACTURES): VIT D 25 HYDROXY: 21 ng/mL — AB (ref 30–100)

## 2016-01-22 ENCOUNTER — Other Ambulatory Visit: Payer: Self-pay | Admitting: Family Medicine

## 2016-01-22 DIAGNOSIS — E559 Vitamin D deficiency, unspecified: Secondary | ICD-10-CM

## 2016-01-22 MED ORDER — ERGOCALCIFEROL 1.25 MG (50000 UT) PO CAPS: 50000.0000 [IU] | ORAL_CAPSULE | ORAL | Status: DC

## 2016-01-23 NOTE — Progress Notes (Signed)
Called and spoke with patient, advised of vitamin D levels and to continue medication as prescribed. Patient verbalized understanding. Thanks!

## 2016-01-25 LAB — CANNABANOIDS (GC/LC/MS), URINE: THC-COOH UR CONFIRM: 607 ng/mL — AB (ref <5)

## 2016-01-26 LAB — PRESCRIPTION MONITORING PROFILE (SOLSTAS)
Amphetamine/Meth: NEGATIVE ng/mL
BARBITURATE SCREEN, URINE: NEGATIVE ng/mL
BENZODIAZEPINE SCREEN, URINE: NEGATIVE ng/mL
BUPRENORPHINE, URINE: NEGATIVE ng/mL
Carisoprodol, Urine: NEGATIVE ng/mL
Cocaine Metabolites: NEGATIVE ng/mL
Creatinine, Urine: 132.26 mg/dL (ref >20.0)
FENTANYL URINE: NEGATIVE ng/mL
MDMA URINE: NEGATIVE ng/mL
MEPERIDINE UR: NEGATIVE ng/mL
METHADONE SCREEN, URINE: NEGATIVE ng/mL
NITRITES URINE, INITIAL: NEGATIVE ug/mL
OPIATE SCREEN, URINE: NEGATIVE ng/mL
OXYCODONE SCRN UR: NEGATIVE ng/mL
PROPOXYPHENE: NEGATIVE ng/mL
TAPENTADOLUR: NEGATIVE ng/mL
Tramadol Scrn, Ur: NEGATIVE ng/mL
Zolpidem, Urine: NEGATIVE ng/mL
pH, Initial: 6.4 pH (ref 4.5–8.9)

## 2016-02-09 ENCOUNTER — Telehealth: Payer: Self-pay

## 2016-02-09 DIAGNOSIS — D571 Sickle-cell disease without crisis: Secondary | ICD-10-CM

## 2016-02-09 NOTE — Telephone Encounter (Signed)
Pt called and requested a medication refill for his Oxycodone 10mg . Thanks!

## 2016-02-09 NOTE — Telephone Encounter (Signed)
Refill request for oxycodone 10mg . LOV 01/20/2016. Please advise. Thanks!

## 2016-02-10 MED ORDER — OXYCODONE-ACETAMINOPHEN 10-325 MG PO TABS: 1.0000 | ORAL_TABLET | Freq: Four times a day (QID) | ORAL | Status: DC | PRN

## 2016-02-10 NOTE — Telephone Encounter (Signed)
Reviewed [place] Substance Reporting system prior to prescribing opiate medications. No inconsistencies noted.   Meds ordered this encounter  Medications  . oxyCODONE-acetaminophen (PERCOCET) 10-325 MG tablet    Sig: Take 1 tablet by mouth every 6 (six) hours as needed for pain.    Dispense:  90 tablet    Refill:  0    Rx not to be filled prior to 02/17/2016    Order Specific Question:  Supervising Provider    Answer:  [name] [id number]    Dorena Dew, FNP

## 2016-02-21 ENCOUNTER — Ambulatory Visit: Payer: Medicaid Other | Admitting: Family Medicine

## 2016-02-23 ENCOUNTER — Telehealth: Payer: Self-pay

## 2016-02-23 DIAGNOSIS — D571 Sickle-cell disease without crisis: Secondary | ICD-10-CM

## 2016-02-23 NOTE — Telephone Encounter (Signed)
Refill request for Fentanyl. LOV 01/20/2016. Please advise. Thanks!

## 2016-02-23 NOTE — Telephone Encounter (Signed)
Pt is requesting a medication refill on his Fentanyl. Thanks!

## 2016-02-24 MED ORDER — FENTANYL 12 MCG/HR TD PT72: 12.5000 ug | MEDICATED_PATCH | TRANSDERMAL | Status: DC

## 2016-02-24 NOTE — Telephone Encounter (Signed)
Reviewed [place] Substance Reporting system prior to prescribing opiate medications, no inconsistencies noted.   Meds ordered this encounter  Medications  . fentaNYL (DURAGESIC - DOSED MCG/HR) 12 MCG/HR    Sig: Place 1 patch (12.5 mcg total) onto the skin every 3 (three) days.    Dispense:  10 patch    Refill:  0    Order Specific Question:  Supervising Provider    Answer:  [name] [id number]      Dorena Dew, FNP

## 2016-03-01 ENCOUNTER — Telehealth: Payer: Self-pay

## 2016-03-01 DIAGNOSIS — D571 Sickle-cell disease without crisis: Secondary | ICD-10-CM

## 2016-03-01 NOTE — Telephone Encounter (Signed)
Pt is requesting a medication refill on Oxycodone. Thanks!

## 2016-03-02 MED ORDER — OXYCODONE-ACETAMINOPHEN 10-325 MG PO TABS: 1.0000 | ORAL_TABLET | Freq: Four times a day (QID) | ORAL | Status: DC | PRN

## 2016-03-02 NOTE — Telephone Encounter (Signed)
Reviewed [place] Substance Reporting system prior to prescribing opiate medications, no inconsistencies noted.   Meds ordered this encounter  Medications  . oxyCODONE-acetaminophen (PERCOCET) 10-325 MG tablet    Sig: Take 1 tablet by mouth every 6 (six) hours as needed for pain.    Dispense:  90 tablet    Refill:  0    Rx not to be filled prior to 03/08/2016    Order Specific Question:  Supervising Provider    Answer:  [name] [id number]    Dorena Dew, FNP

## 2016-03-22 ENCOUNTER — Telehealth: Payer: Self-pay

## 2016-03-22 DIAGNOSIS — D571 Sickle-cell disease without crisis: Secondary | ICD-10-CM

## 2016-03-22 MED ORDER — OXYCODONE-ACETAMINOPHEN 10-325 MG PO TABS: 1.0000 | ORAL_TABLET | Freq: Four times a day (QID) | ORAL | Status: DC | PRN

## 2016-03-22 MED ORDER — FENTANYL 12 MCG/HR TD PT72: 12.5000 ug | MEDICATED_PATCH | TRANSDERMAL | Status: DC

## 2016-03-22 NOTE — Telephone Encounter (Signed)
Pt is requesting a medication refill for Oxycodone, 10mg  and Fentanyl. Thanks!

## 2016-03-22 NOTE — Telephone Encounter (Signed)
Refill request for oxycodone and fentanyl. LOV 01/20/2016

## 2016-03-22 NOTE — Telephone Encounter (Signed)
Reviewed [place] Substance Reporting system prior to prescribing opiate medications, no inconsistencies noted.    Meds ordered this encounter  Medications  . oxyCODONE-acetaminophen (PERCOCET) 10-325 MG tablet    Sig: Take 1 tablet by mouth every 6 (six) hours as needed for pain.    Dispense:  90 tablet    Refill:  0    Rx not to be filled prior to 03/28/2016    Order Specific Question:  Supervising Provider    Answer:  [name] [id number]  . fentaNYL (DURAGESIC - DOSED MCG/HR) 12 MCG/HR    Sig: Place 1 patch (12.5 mcg total) onto the skin every 3 (three) days.    Dispense:  10 patch    Refill:  0    Order Specific Question:  Supervising Provider    Answer:  [name] [id number]    Dorena Dew, FNP

## 2016-04-03 ENCOUNTER — Ambulatory Visit: Payer: Medicaid Other | Admitting: Family Medicine

## 2016-04-11 ENCOUNTER — Ambulatory Visit (INDEPENDENT_AMBULATORY_CARE_PROVIDER_SITE_OTHER): Payer: Medicaid Other | Admitting: Family Medicine

## 2016-04-11 ENCOUNTER — Encounter: Payer: Self-pay | Admitting: Family Medicine

## 2016-04-11 VITALS — BP 122/67 | HR 64 | Temp 97.8°F | Resp 16 | Ht 74.0 in | Wt 131.0 lb

## 2016-04-11 DIAGNOSIS — E559 Vitamin D deficiency, unspecified: Secondary | ICD-10-CM

## 2016-04-11 DIAGNOSIS — D571 Sickle-cell disease without crisis: Secondary | ICD-10-CM | POA: Diagnosis not present

## 2016-04-11 LAB — COMPLETE METABOLIC PANEL WITH GFR
ALBUMIN: 4.4 g/dL (ref 3.6–5.1)
ALK PHOS: 72 U/L (ref 40–115)
ALT: 10 U/L (ref 9–46)
AST: 15 U/L (ref 10–40)
BUN: 10 mg/dL (ref 7–25)
CHLORIDE: 102 mmol/L (ref 98–110)
CO2: 28 mmol/L (ref 20–31)
Calcium: 9.8 mg/dL (ref 8.6–10.3)
Creat: 0.93 mg/dL (ref 0.60–1.35)
GFR, Est African American: >89 mL/min (ref >=60)
GLUCOSE: 71 mg/dL (ref 65–99)
POTASSIUM: 4 mmol/L (ref 3.5–5.3)
SODIUM: 140 mmol/L (ref 135–146)
Total Bilirubin: 0.5 mg/dL (ref 0.2–1.2)
Total Protein: 7.5 g/dL (ref 6.1–8.1)

## 2016-04-11 LAB — CBC WITH DIFFERENTIAL/PLATELET
BASOS ABS: 0 {cells}/uL (ref 0–200)
Basophils Relative: 0 %
EOS PCT: 2 %
Eosinophils Absolute: 172 cells/uL (ref 15–500)
HCT: 42 % (ref 38.5–50.0)
HEMOGLOBIN: 13.8 g/dL (ref 13.2–17.1)
LYMPHS ABS: 1978 {cells}/uL (ref 850–3900)
LYMPHS PCT: 23 %
MCH: 27 pg (ref 27.0–33.0)
MCHC: 32.9 g/dL (ref 32.0–36.0)
MCV: 82.2 fL (ref 80.0–100.0)
MONOS PCT: 4 %
MPV: 10.7 fL (ref 7.5–12.5)
Monocytes Absolute: 344 cells/uL (ref 200–950)
NEUTROS PCT: 71 %
Neutro Abs: 6106 cells/uL (ref 1500–7800)
Platelets: 223 10*3/uL (ref 140–400)
RBC: 5.11 MIL/uL (ref 4.20–5.80)
RDW: 15.6 % — AB (ref 11.0–15.0)
WBC: 8.6 10*3/uL (ref 3.8–10.8)

## 2016-04-11 LAB — POCT URINALYSIS DIP (DEVICE)
Bilirubin Urine: NEGATIVE
GLUCOSE, UA: NEGATIVE mg/dL
Hgb urine dipstick: NEGATIVE
KETONES UR: NEGATIVE mg/dL
LEUKOCYTES UA: NEGATIVE
Nitrite: NEGATIVE
PROTEIN: NEGATIVE mg/dL
Specific Gravity, Urine: 1.01 (ref 1.005–1.030)
UROBILINOGEN UA: 0.2 mg/dL (ref 0.0–1.0)
pH: 6.5 (ref 5.0–8.0)

## 2016-04-11 LAB — RETICULOCYTES
ABS RETIC: 51100 {cells}/uL (ref 25000–90000)
RBC.: 5.11 MIL/uL (ref 4.20–5.80)
RETIC CT PCT: 1 %

## 2016-04-11 NOTE — Patient Instructions (Addendum)
[facility] Sickle Cell Anemia, Adult Sickle cell anemia is a condition in which red blood cells have an abnormal "sickle" shape. This abnormal shape shortens the cells' life span, which results in a lower than normal concentration of red blood cells in the blood. The sickle shape also causes the cells to clump together and block free blood flow through the blood vessels. As a result, the tissues and organs of the body do not receive enough oxygen. Sickle cell anemia causes organ damage and pain and increases the risk of infection. CAUSES  Sickle cell anemia is a genetic disorder. Those who receive two copies of the gene have the condition, and those who receive one copy have the trait. RISK FACTORS The sickle cell gene is most common in people whose families originated in [place]. Other areas of the globe where sickle cell trait occurs include the [place], [place] and [place], and the [place].  SIGNS AND SYMPTOMS  Pain, especially in the extremities, back, chest, or abdomen (common). The pain may start suddenly or may develop following an illness, especially if there is dehydration. Pain can also occur due to overexertion or exposure to extreme temperature changes.  Frequent severe bacterial infections, especially certain types of pneumonia and meningitis.  Pain and swelling in the hands and feet.  Decreased activity.   Loss of appetite.   Change in behavior.  Headaches.  Seizures.  Shortness of breath or difficulty breathing.  Vision changes.  Skin ulcers. Those with the trait may not have symptoms or they may have mild symptoms.  DIAGNOSIS  Sickle cell anemia is diagnosed with blood tests that demonstrate the genetic trait. It is often diagnosed during the newborn period, due to mandatory testing nationwide. A variety of blood tests, X-rays, CT scans, MRI scans, ultrasounds, and lung function tests may also be done to  monitor the condition. TREATMENT  Sickle cell anemia may be treated with:  Medicines. You may be given pain medicines, antibiotic medicines (to treat and prevent infections) or medicines to increase the production of certain types of hemoglobin.  Fluids.  Oxygen.  Blood transfusions. HOME CARE INSTRUCTIONS   Drink enough fluid to keep your urine clear or pale yellow. Increase your fluid intake in hot weather and during exercise.  Do not smoke. Smoking lowers oxygen levels in the blood.   Only take over-the-counter or prescription medicines for pain, fever, or discomfort as directed by your health care provider.  Take antibiotics as directed by your health care provider. Make sure you finish them it even if you start to feel better.   Take supplements as directed by your health care provider.   Consider wearing a medical alert bracelet. This tells anyone caring for you in an emergency of your condition.   When traveling, keep your medical information, health care provider's names, and the medicines you take with you at all times.   If you develop a fever, do not take medicines to reduce the fever right away. This could cover up a problem that is developing. Notify your health care provider.  Keep all follow-up appointments with your health care provider. Sickle cell anemia requires regular medical care. SEEK MEDICAL CARE IF: You have a fever. SEEK IMMEDIATE MEDICAL CARE IF:   You feel dizzy or faint.   You have new abdominal pain, especially on the left side near the stomach area.   You develop a persistent, often uncomfortable and painful penile erection (priapism). If  this is not treated immediately it will lead to impotence.   You have numbness your arms or legs or you have a hard time moving them.   You have a hard time with speech.   You have a fever or persistent symptoms for more than 2-3 days.   You have a fever and your symptoms suddenly get worse.    You have signs or symptoms of infection. These include:   Chills.   Abnormal tiredness (lethargy).   Irritability.   Poor eating.   Vomiting.   You develop pain that is not helped with medicine.   You develop shortness of breath.  You have pain in your chest.   You are coughing up pus-like or bloody sputum.   You develop a stiff neck.  Your feet or hands swell or have pain.  Your abdomen appears bloated.  You develop joint pain. MAKE SURE YOU:  Understand these instructions.   This information is not intended to replace advice given to you by your health care provider. Make sure you discuss any questions you have with your health care provider.   Document Released: 02/06/2006 Document Revised: 11/19/2014 Document Reviewed: 06/10/2013 Elsevier Interactive Patient Education [employer].

## 2016-04-11 NOTE — Progress Notes (Signed)
Subjective:    Patient ID: James Hamilton, male    DOB: 1986/09/10, 30 y.o.   MRN: [id number]  HPI  Mr. Slone Fazel, a 30 year old male with a history of sickle cell anemia, HbSS presents to for a 1 month follow up of sickle cell anemia and medication management. Patient has been taking medications consistently. He has a history of chronic pain that is primarily to lower back. Mr. Braga says that he is doing physical therapy consistently with minimal relief. Current pain intensity is 5/10 described as intermittent and aching. Patient denies fever, fatigue, headache, shortness of breath, dysuria, nausea, vomiting, or diarrhea.   Past Medical History  Diagnosis Date  . Sickle cell disease ([place])   . Sickle cell anemia (HCC)   . Closed TBI (traumatic brain injury) ([place]) m-3  . Fracture of femur (HCC) m-2    Multiple femoral fractures of RLE. S/P repair   Immunization History  Administered Date(s) Administered  . Influenza,inj,Quad PF,36+ Mos 08/30/2014, 08/04/2015  . Meningococcal Conjugate 05/02/2015  . Pneumococcal Conjugate-13 01/19/2015  . Pneumococcal Polysaccharide-23 04/22/2012  . Tdap 12/16/2013  No Known Allergies Social History   Social History  . Marital Status: Single    Spouse Name: N/A  . Number of Children: N/A  . Years of Education: N/A   Occupational History  . Not on file.   Social History Main Topics  . Smoking status: Light Tobacco Smoker    Types: Cigarettes  . Smokeless tobacco: Not on file     Comment: Pt states he smokes loose cigarettes (2) at the most  . Alcohol Use: No  . Drug Use: No  . Sexual Activity: Yes    Birth Control/ Protection: None   Other Topics Concern  . Not on file   Social History Narrative   ** Merged History Encounter **       Review of Systems  HENT: Negative.   Eyes: Negative.  Negative for photophobia and visual disturbance.  Respiratory: Negative.   Cardiovascular: Negative.   Gastrointestinal:  Negative.   Endocrine: Negative.  Negative for polydipsia, polyphagia and polyuria.  Genitourinary: Negative.   Musculoskeletal: Positive for myalgias and back pain.  Skin: Negative.   Allergic/Immunologic: Negative.   Neurological: Negative for facial asymmetry and headaches.  Hematological: Negative.   Psychiatric/Behavioral: Negative.        Objective:   Physical Exam  Constitutional: He is oriented to person, place, and time. He appears well-developed and well-nourished.  HENT:  Head: Normocephalic and atraumatic.  Right Ear: External ear normal.  Left Ear: External ear normal.  Nose: Nose normal.  Mouth/Throat: Oropharynx is clear and moist.  Eyes: Conjunctivae, EOM and lids are normal. Pupils are equal, round, and reactive to light. No scleral icterus.  Neck: Normal range of motion. Neck supple.  Pulmonary/Chest: Effort normal and breath sounds normal.  Abdominal: Soft. Bowel sounds are normal.  Musculoskeletal:       Lumbar back: He exhibits decreased range of motion, tenderness and pain.  Neurological: He is alert and oriented to person, place, and time. He has normal reflexes.  Skin: Skin is warm and dry.  Psychiatric: He has a normal mood and affect. His behavior is normal. Judgment and thought content normal.         BP 122/67 mmHg  Pulse 64  Temp(Src) 97.8 F (36.6 C) (Oral)  Resp 16  Ht 6\' 2"  (1.88 m)  Wt 131 lb (59.421 kg)  BMI 16.81 kg/m2  SpO2 100% Assessment & Plan:  1. Hb-SS disease without crisis ([place]) Sickle cell disease - Continue Hydrea 500 mg daily. Will not increase medication at this point. We discussed the need for good hydration, monitoring of hydration status, avoidance of heat, cold, stress, and infection triggers. We discussed the risks and benefits of Hydrea, including bone marrow suppression, the possibility of GI upset, skin ulcers, hair thinning, and teratogenicity. The patient was reminded of the need to seek medical attention of any  symptoms of bleeding, anemia, or infection. Continue folic acid 1 mg daily to prevent aplastic bone marrow crises.   Pulmonary evaluation - Patient denies severe recurrent wheezes, shortness of breath with exercise, or persistent cough. If these symptoms develop, pulmonary function tests with spirometry will be ordered, and if abnormal, plan on referral to Pulmonology for further evaluation.  Cardiac - Routine screening for pulmonary hypertension is not recommended.  Eye - High risk of proliferative retinopathy. Annual eye exam with retinal exam recommended to patient.   Immunization status - Vaccinations up to date   Acute and chronic painful episodes - We agreed on Percocet 10-325 mg every 6 hours as needed for moderate to severe pain and Fentanyl duragesic 12.5 mcg every 72 hours.  We discussed that pt is to receive his Schedule II prescriptions only from [place]. Pt is also aware that the prescription history is available to [place] online through the [facility] CSRS. Controlled substance agreement signed previously. We reminded [place] that all patients receiving Schedule II narcotics must be seen for follow within one month of prescription being requested. We reviewed the terms of our pain agreement, including the need to keep medicines in a safe locked location away from children or pets, and the need to report excess sedation or constipation, measures to avoid constipation, and policies related to early refills and stolen prescriptions.   According to the [place] [facility] program, we have reviewed details related to analgesia, adverse effects, aberrant behaviors.    Vitamin D deficiency - Drisdol 50,000 units weekly, we encouraged Daymian to take it.  - CBC with Differential - COMPLETE METABOLIC PANEL WITH GFR - Reticulocytes    RTC: 1 month for sickle cell anemia and medication management    Dorena Dew, FNP

## 2016-04-12 ENCOUNTER — Telehealth: Payer: Self-pay

## 2016-04-12 DIAGNOSIS — D571 Sickle-cell disease without crisis: Secondary | ICD-10-CM

## 2016-04-12 MED ORDER — OXYCODONE-ACETAMINOPHEN 10-325 MG PO TABS: 1.0000 | ORAL_TABLET | Freq: Four times a day (QID) | ORAL | Status: DC | PRN

## 2016-04-12 NOTE — Telephone Encounter (Signed)
Pt is requesting a medication refill for his Oxycodone, 10mg . Thanks!

## 2016-04-12 NOTE — Telephone Encounter (Signed)
Reviewed [place] Substance Reporting system prior to prescribing opiate medications, no inconsistencies noted.   Meds ordered this encounter  Medications  . oxyCODONE-acetaminophen (PERCOCET) 10-325 MG tablet    Sig: Take 1 tablet by mouth every 6 (six) hours as needed for pain.    Dispense:  90 tablet    Refill:  0    Rx not to be filled prior to 04/19/2016    Order Specific Question:  Supervising Provider    Answer:  [name] [id number]     Dorena Dew, FNP

## 2016-04-12 NOTE — Telephone Encounter (Signed)
Refill request for oxycodone 10mg . LOV 04/11/2016. Please advise. Thanks!

## 2016-04-23 ENCOUNTER — Telehealth: Payer: Self-pay

## 2016-04-23 DIAGNOSIS — D571 Sickle-cell disease without crisis: Secondary | ICD-10-CM

## 2016-04-23 MED ORDER — FENTANYL 12 MCG/HR TD PT72: 12.5000 ug | MEDICATED_PATCH | TRANSDERMAL | Status: DC

## 2016-04-23 NOTE — Telephone Encounter (Signed)
Refill request for fentanyl patches. LOV 04/11/2016. Please advise. Thanks!

## 2016-04-23 NOTE — Telephone Encounter (Signed)
Pt is requesting a medication refill for Fentanyl patches. Thanks!

## 2016-04-23 NOTE — Telephone Encounter (Signed)
Reviewed [place] Substance Reporting system prior to prescribing opiate medications, no inconsistencies noted.   Meds ordered this encounter  Medications  . fentaNYL (DURAGESIC - DOSED MCG/HR) 12 MCG/HR    Sig: Place 1 patch (12.5 mcg total) onto the skin every 3 (three) days.    Dispense:  10 patch    Refill:  0    Rx not to be filled prior to 04/27/2016    Order Specific Question:  Supervising Provider    Answer:  [name] [id number]    Dorena Dew, FNP

## 2016-05-07 ENCOUNTER — Telehealth: Payer: Self-pay

## 2016-05-07 DIAGNOSIS — D571 Sickle-cell disease without crisis: Secondary | ICD-10-CM

## 2016-05-07 NOTE — Telephone Encounter (Signed)
Pt is requesting a medication refill on Oxycodone, 10mg . Thanks!

## 2016-05-07 NOTE — Telephone Encounter (Signed)
Refill request for oxycodone 10mg . LOV 04/11/2016. Please advise. Thanks!

## 2016-05-08 MED ORDER — OXYCODONE-ACETAMINOPHEN 10-325 MG PO TABS: 1.0000 | ORAL_TABLET | Freq: Four times a day (QID) | ORAL | Status: DC | PRN

## 2016-05-08 NOTE — Telephone Encounter (Signed)
Reviewed [place] Substance Reporting system prior to prescribing opiate medications, no inconsistencies noted.   Meds ordered this encounter  Medications  . oxyCODONE-acetaminophen (PERCOCET) 10-325 MG tablet    Sig: Take 1 tablet by mouth every 6 (six) hours as needed for pain.    Dispense:  90 tablet    Refill:  0    Rx not to be filled prior to 05/11/2016    Order Specific Question:  Supervising Provider    Answer:  [name] [id number]    Dorena Dew, FNP

## 2016-05-16 ENCOUNTER — Ambulatory Visit: Payer: Medicaid Other | Admitting: Family Medicine

## 2016-05-24 ENCOUNTER — Other Ambulatory Visit: Payer: Self-pay | Admitting: Family Medicine

## 2016-05-24 ENCOUNTER — Telehealth: Payer: Self-pay | Admitting: *Deleted

## 2016-05-24 NOTE — Telephone Encounter (Signed)
Refill request for percocet and fentanyl patch. LOV 04/11/2016. Please advise. Thanks!

## 2016-05-24 NOTE — Telephone Encounter (Signed)
Patient called and states he needs a refill of his Percocet and Fentanyl patch. Please advise provider. Thanks

## 2016-05-29 ENCOUNTER — Other Ambulatory Visit: Payer: Self-pay | Admitting: Internal Medicine

## 2016-05-29 ENCOUNTER — Other Ambulatory Visit: Payer: Self-pay | Admitting: *Deleted

## 2016-05-29 DIAGNOSIS — D571 Sickle-cell disease without crisis: Secondary | ICD-10-CM

## 2016-05-29 MED ORDER — OXYCODONE-ACETAMINOPHEN 10-325 MG PO TABS: 1.0000 | ORAL_TABLET | Freq: Four times a day (QID) | ORAL | Status: DC | PRN

## 2016-05-29 MED ORDER — FENTANYL 12 MCG/HR TD PT72: 12.5000 ug | MEDICATED_PATCH | TRANSDERMAL | Status: DC

## 2016-05-30 ENCOUNTER — Ambulatory Visit (INDEPENDENT_AMBULATORY_CARE_PROVIDER_SITE_OTHER): Payer: Medicaid Other | Admitting: Family Medicine

## 2016-05-30 ENCOUNTER — Encounter: Payer: Self-pay | Admitting: Family Medicine

## 2016-05-30 VITALS — BP 122/75 | HR 77 | Temp 97.9°F | Resp 14 | Ht 74.0 in | Wt 131.0 lb

## 2016-05-30 DIAGNOSIS — E559 Vitamin D deficiency, unspecified: Secondary | ICD-10-CM | POA: Diagnosis not present

## 2016-05-30 DIAGNOSIS — D571 Sickle-cell disease without crisis: Secondary | ICD-10-CM

## 2016-05-30 MED ORDER — ERGOCALCIFEROL 1.25 MG (50000 UT) PO CAPS: 50000.0000 [IU] | ORAL_CAPSULE | ORAL | Status: DC

## 2016-05-30 NOTE — Patient Instructions (Signed)
Review your drug contract. Keep drugs locked safely away. Follow-up one month.
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
## 2016-06-14 ENCOUNTER — Telehealth: Payer: Self-pay

## 2016-06-14 NOTE — Telephone Encounter (Signed)
Refill request for oxycodone 10mg. Please advise. Thanks!  

## 2016-06-15 ENCOUNTER — Other Ambulatory Visit: Payer: Self-pay | Admitting: Internal Medicine

## 2016-06-15 DIAGNOSIS — D571 Sickle-cell disease without crisis: Secondary | ICD-10-CM

## 2016-06-15 MED ORDER — OXYCODONE-ACETAMINOPHEN 10-325 MG PO TABS: 1.0000 | ORAL_TABLET | Freq: Four times a day (QID) | ORAL | 0 refills | Status: DC | PRN

## 2016-06-26 ENCOUNTER — Telehealth: Payer: Self-pay

## 2016-06-26 ENCOUNTER — Other Ambulatory Visit: Payer: Self-pay | Admitting: Internal Medicine

## 2016-06-26 DIAGNOSIS — D571 Sickle-cell disease without crisis: Secondary | ICD-10-CM

## 2016-06-26 MED ORDER — FENTANYL 12 MCG/HR TD PT72: 12.5000 ug | MEDICATED_PATCH | TRANSDERMAL | 0 refills | Status: DC

## 2016-06-26 NOTE — Telephone Encounter (Signed)
Refill request for fentanyl patches. LOV 05/30/2016. Please advise. Thanks!

## 2016-07-04 ENCOUNTER — Ambulatory Visit (INDEPENDENT_AMBULATORY_CARE_PROVIDER_SITE_OTHER): Payer: Medicaid Other | Admitting: Family Medicine

## 2016-07-04 ENCOUNTER — Encounter: Payer: Self-pay | Admitting: Family Medicine

## 2016-07-04 VITALS — BP 120/69 | HR 74 | Temp 98.4°F | Resp 16 | Ht 74.0 in | Wt 135.0 lb

## 2016-07-04 DIAGNOSIS — D571 Sickle-cell disease without crisis: Secondary | ICD-10-CM

## 2016-07-04 LAB — RETICULOCYTES
ABS RETIC: 66820 {cells}/uL (ref 25000–90000)
RBC.: 5.14 MIL/uL (ref 4.20–5.80)
Retic Ct Pct: 1.3 %

## 2016-07-04 LAB — COMPLETE METABOLIC PANEL WITH GFR
ALT: 9 U/L (ref 9–46)
AST: 17 U/L (ref 10–40)
Albumin: 4.2 g/dL (ref 3.6–5.1)
Alkaline Phosphatase: 56 U/L (ref 40–115)
BUN: 7 mg/dL (ref 7–25)
CALCIUM: 9.3 mg/dL (ref 8.6–10.3)
CHLORIDE: 106 mmol/L (ref 98–110)
CO2: 24 mmol/L (ref 20–31)
Creat: 0.9 mg/dL (ref 0.60–1.35)
GFR, Est African American: >89 mL/min (ref >=60)
GFR, Est Non African American: >89 mL/min (ref >=60)
Glucose, Bld: 69 mg/dL (ref 65–99)
POTASSIUM: 4.5 mmol/L (ref 3.5–5.3)
SODIUM: 139 mmol/L (ref 135–146)
Total Bilirubin: 0.5 mg/dL (ref 0.2–1.2)
Total Protein: 7.1 g/dL (ref 6.1–8.1)

## 2016-07-04 LAB — CBC WITH DIFFERENTIAL/PLATELET
BASOS PCT: 0 %
Basophils Absolute: 0 cells/uL (ref 0–200)
EOS ABS: 183 {cells}/uL (ref 15–500)
Eosinophils Relative: 3 %
HEMATOCRIT: 41.4 % (ref 38.5–50.0)
HEMOGLOBIN: 13.9 g/dL (ref 13.2–17.1)
LYMPHS ABS: 1464 {cells}/uL (ref 850–3900)
Lymphocytes Relative: 24 %
MCH: 27 pg (ref 27.0–33.0)
MCHC: 33.6 g/dL (ref 32.0–36.0)
MCV: 80.5 fL (ref 80.0–100.0)
MONO ABS: 305 {cells}/uL (ref 200–950)
MPV: 10.3 fL (ref 7.5–12.5)
Monocytes Relative: 5 %
NEUTROS PCT: 68 %
Neutro Abs: 4148 cells/uL (ref 1500–7800)
Platelets: 253 10*3/uL (ref 140–400)
RBC: 5.14 MIL/uL (ref 4.20–5.80)
RDW: 15.5 % — ABNORMAL HIGH (ref 11.0–15.0)
WBC: 6.1 10*3/uL (ref 3.8–10.8)

## 2016-07-04 MED ORDER — HYDROXYUREA 500 MG PO CAPS: 500.0000 mg | ORAL_CAPSULE | Freq: Every day | ORAL | 5 refills | Status: DC

## 2016-07-04 MED ORDER — OXYCODONE-ACETAMINOPHEN 10-325 MG PO TABS: 1.0000 | ORAL_TABLET | Freq: Four times a day (QID) | ORAL | 0 refills | Status: DC | PRN
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
## 2016-07-10 ENCOUNTER — Telehealth: Payer: Self-pay

## 2016-07-10 NOTE — Telephone Encounter (Signed)
MA faxed [place]. Awaiting response.

## 2016-07-18 ENCOUNTER — Telehealth: Payer: Self-pay | Admitting: Hematology

## 2016-07-18 NOTE — Telephone Encounter (Signed)
Patient is requesting a 14 day supply of medications instead of 15 day.  Per patient medicaid does not require an authorization if it is a 14 day supply. / Can you please call the patient back and discuss or advise. / Patient has an appointment on 08/07/2016 but says he will needs medication prior to this visit. /

## 2016-07-23 ENCOUNTER — Telehealth: Payer: Self-pay

## 2016-07-23 NOTE — Telephone Encounter (Signed)
Refill request for Oxycodone and Fentanyl Patches. Patient is calling in a couple days early due to Insurance needing Prior authorization now. Wanted to call in early so authorization could be complete by the time medication is needed. Please advise. Thanks!

## 2016-07-23 NOTE — Telephone Encounter (Signed)
1 

## 2016-07-24 ENCOUNTER — Other Ambulatory Visit: Payer: Self-pay | Admitting: Family Medicine

## 2016-07-24 DIAGNOSIS — D571 Sickle-cell disease without crisis: Secondary | ICD-10-CM

## 2016-07-24 MED ORDER — FENTANYL 12 MCG/HR TD PT72: 12.5000 ug | MEDICATED_PATCH | TRANSDERMAL | 0 refills | Status: DC

## 2016-07-24 MED ORDER — OXYCODONE-ACETAMINOPHEN 10-325 MG PO TABS: 1.0000 | ORAL_TABLET | Freq: Four times a day (QID) | ORAL | 0 refills | Status: DC | PRN

## 2016-08-07 ENCOUNTER — Encounter: Payer: Self-pay | Admitting: Family Medicine

## 2016-08-07 ENCOUNTER — Ambulatory Visit (INDEPENDENT_AMBULATORY_CARE_PROVIDER_SITE_OTHER): Payer: Medicaid Other | Admitting: Family Medicine

## 2016-08-07 DIAGNOSIS — D571 Sickle-cell disease without crisis: Secondary | ICD-10-CM | POA: Diagnosis not present

## 2016-08-07 MED ORDER — FOLIC ACID 1 MG PO TABS: 1.0000 mg | ORAL_TABLET | Freq: Every day | ORAL | 11 refills | Status: DC

## 2016-08-07 NOTE — Patient Instructions (Signed)
Continue current treatment Try to protect your back from heating pad. Keep well hydrated. Keep medications stored in safe place
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
## 2016-08-09 ENCOUNTER — Telehealth: Payer: Self-pay

## 2016-08-09 NOTE — Telephone Encounter (Signed)
Refill request for oxycodone 10mg  Please advise. Thanks! 08/07/2016

## 2016-08-14 ENCOUNTER — Other Ambulatory Visit: Payer: Self-pay | Admitting: Internal Medicine

## 2016-08-14 DIAGNOSIS — D571 Sickle-cell disease without crisis: Secondary | ICD-10-CM

## 2016-08-14 MED ORDER — OXYCODONE-ACETAMINOPHEN 10-325 MG PO TABS: 1.0000 | ORAL_TABLET | Freq: Four times a day (QID) | ORAL | 0 refills | Status: DC | PRN

## 2016-08-20 ENCOUNTER — Telehealth: Payer: Self-pay

## 2016-08-21 ENCOUNTER — Other Ambulatory Visit: Payer: Self-pay | Admitting: Internal Medicine

## 2016-08-21 DIAGNOSIS — D571 Sickle-cell disease without crisis: Secondary | ICD-10-CM

## 2016-08-21 MED ORDER — FENTANYL 12 MCG/HR TD PT72: 12.5000 ug | MEDICATED_PATCH | TRANSDERMAL | 0 refills | Status: DC

## 2016-09-04 ENCOUNTER — Ambulatory Visit (INDEPENDENT_AMBULATORY_CARE_PROVIDER_SITE_OTHER): Payer: Medicaid Other | Admitting: Family Medicine

## 2016-09-04 ENCOUNTER — Encounter: Payer: Self-pay | Admitting: Family Medicine

## 2016-09-04 DIAGNOSIS — D571 Sickle-cell disease without crisis: Secondary | ICD-10-CM

## 2016-09-04 MED ORDER — FOLIC ACID 1 MG PO TABS: 1.0000 mg | ORAL_TABLET | Freq: Every day | ORAL | 11 refills | Status: DC

## 2016-09-04 MED ORDER — OXYCODONE-ACETAMINOPHEN 10-325 MG PO TABS: 1.0000 | ORAL_TABLET | Freq: Four times a day (QID) | ORAL | 0 refills | Status: DC | PRN

## 2016-09-04 NOTE — Patient Instructions (Signed)
Continue same medications Come back in one month. Will do lab work and give you your flu shot
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
## 2016-09-18 ENCOUNTER — Telehealth: Payer: Self-pay

## 2016-09-18 ENCOUNTER — Other Ambulatory Visit: Payer: Self-pay | Admitting: Internal Medicine

## 2016-09-18 DIAGNOSIS — D571 Sickle-cell disease without crisis: Secondary | ICD-10-CM

## 2016-09-18 MED ORDER — FENTANYL 12 MCG/HR TD PT72: 12.5000 ug | MEDICATED_PATCH | TRANSDERMAL | 0 refills | Status: DC

## 2016-09-20 ENCOUNTER — Other Ambulatory Visit: Payer: Self-pay | Admitting: Internal Medicine

## 2016-09-20 ENCOUNTER — Telehealth: Payer: Self-pay

## 2016-09-20 DIAGNOSIS — D571 Sickle-cell disease without crisis: Secondary | ICD-10-CM

## 2016-09-20 MED ORDER — OXYCODONE-ACETAMINOPHEN 10-325 MG PO TABS: 1.0000 | ORAL_TABLET | Freq: Four times a day (QID) | ORAL | 0 refills | Status: DC | PRN

## 2016-09-25 ENCOUNTER — Ambulatory Visit: Payer: Medicaid Other | Admitting: Internal Medicine

## 2016-10-09 ENCOUNTER — Ambulatory Visit: Payer: Medicaid Other | Admitting: Internal Medicine

## 2016-10-15 ENCOUNTER — Telehealth: Payer: Self-pay

## 2016-10-15 ENCOUNTER — Other Ambulatory Visit: Payer: Self-pay | Admitting: Internal Medicine

## 2016-10-15 DIAGNOSIS — D571 Sickle-cell disease without crisis: Secondary | ICD-10-CM

## 2016-10-15 MED ORDER — OXYCODONE-ACETAMINOPHEN 10-325 MG PO TABS: 1.0000 | ORAL_TABLET | Freq: Four times a day (QID) | ORAL | 0 refills | Status: DC | PRN

## 2016-10-15 NOTE — Telephone Encounter (Signed)
Refilled

## 2016-10-23 ENCOUNTER — Ambulatory Visit (INDEPENDENT_AMBULATORY_CARE_PROVIDER_SITE_OTHER): Payer: Medicaid Other | Admitting: Internal Medicine

## 2016-10-23 ENCOUNTER — Encounter: Payer: Self-pay | Admitting: Internal Medicine

## 2016-10-23 VITALS — BP 112/76 | HR 80 | Temp 98.7°F | Resp 18 | Ht 74.0 in | Wt 147.0 lb

## 2016-10-23 DIAGNOSIS — Z720 Tobacco use: Secondary | ICD-10-CM | POA: Diagnosis not present

## 2016-10-23 DIAGNOSIS — D571 Sickle-cell disease without crisis: Secondary | ICD-10-CM

## 2016-10-23 DIAGNOSIS — G894 Chronic pain syndrome: Secondary | ICD-10-CM | POA: Diagnosis not present

## 2016-10-23 MED ORDER — FENTANYL 12 MCG/HR TD PT72: 12.5000 ug | MEDICATED_PATCH | TRANSDERMAL | 0 refills | Status: DC

## 2016-10-23 NOTE — Patient Instructions (Signed)
Sickle Cell Anemia, Adult °Sickle cell anemia is a condition in which red blood cells have an abnormal “sickle” shape. This abnormal shape shortens the cells’ life span, which results in a lower than normal concentration of red blood cells in the blood. The sickle shape also causes the cells to clump together and block free blood flow through the blood vessels. As a result, the tissues and organs of the body do not receive enough oxygen. Sickle cell anemia causes organ damage and pain and increases the risk of infection. °What are the causes? °Sickle cell anemia is a genetic disorder. Those who receive two copies of the gene have the condition, and those who receive one copy have the trait. °What increases the risk? °The sickle cell gene is most common in people whose families originated in [place]. Other areas of the globe where sickle cell trait occurs include the Mediterranean, [place] and [place], the [place], and the [place]. °What are the signs or symptoms? °· Pain, especially in the extremities, back, chest, or abdomen (common). The pain may start suddenly or may develop following an illness, especially if there is dehydration. Pain can also occur due to overexertion or exposure to extreme temperature changes. °· Frequent severe bacterial infections, especially certain types of pneumonia and meningitis. °· Pain and swelling in the hands and feet. °· Decreased activity. °· Loss of appetite. °· Change in behavior. °· Headaches. °· Seizures. °· Shortness of breath or difficulty breathing. °· Vision changes. °· Skin ulcers. °Those with the trait may not have symptoms or they may have mild symptoms. °How is this diagnosed? °Sickle cell anemia is diagnosed with blood tests that demonstrate the genetic trait. It is often diagnosed during the newborn period, due to mandatory testing nationwide. A variety of blood tests, X-rays, CT scans, MRI scans, ultrasounds, and lung function tests may also be done to  monitor the condition. °How is this treated? °Sickle cell anemia may be treated with: °· Medicines. You may be given pain medicines, antibiotic medicines (to treat and prevent infections) or medicines to increase the production of certain types of hemoglobin. °· Fluids. °· Oxygen. °· Blood transfusions. ° °Follow these instructions at home: °· Drink enough fluid to keep your urine clear or pale yellow. Increase your fluid intake in hot weather and during exercise. °· Do not smoke. Smoking lowers oxygen levels in the blood. °· Only take over-the-counter or prescription medicines for pain, fever, or discomfort as directed by your health care provider. °· Take antibiotics as directed by your health care provider. Make sure you finish them it even if you start to feel better. °· Take supplements as directed by your health care provider. °· Consider wearing a medical alert bracelet. This tells anyone caring for you in an emergency of your condition. °· When traveling, keep your medical information, health care provider's names, and the medicines you take with you at all times. °· If you develop a fever, do not take medicines to reduce the fever right away. This could cover up a problem that is developing. Notify your health care provider. °· Keep all follow-up appointments with your health care provider. Sickle cell anemia requires regular medical care. °Contact a health care provider if: °You have a fever. °Get help right away if: °· You feel dizzy or faint. °· You have new abdominal pain, especially on the left side near the stomach area. °· You develop a persistent, often uncomfortable and painful penile erection (priapism). If this is not   treated immediately it will lead to impotence. °· You have numbness your arms or legs or you have a hard time moving them. °· You have a hard time with speech. °· You have a fever or persistent symptoms for more than 2-3 days. °· You have a fever and your symptoms suddenly get  worse. °· You have signs or symptoms of infection. These include: °? Chills. °? Abnormal tiredness (lethargy). °? Irritability. °? Poor eating. °? Vomiting. °· You develop pain that is not helped with medicine. °· You develop shortness of breath. °· You have pain in your chest. °· You are coughing up pus-like or bloody sputum. °· You develop a stiff neck. °· Your feet or hands swell or have pain. °· Your abdomen appears bloated. °· You develop joint pain. °This information is not intended to replace advice given to you by your health care provider. Make sure you discuss any questions you have with your health care provider. °Document Released: 02/06/2006 Document Revised: 05/18/2016 Document Reviewed: 06/10/2013 °Elsevier Interactive Patient Education © 2017 [employer] ° °
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
## 2016-11-02 ENCOUNTER — Telehealth: Payer: Self-pay

## 2016-11-02 ENCOUNTER — Other Ambulatory Visit: Payer: Self-pay | Admitting: Internal Medicine

## 2016-11-02 DIAGNOSIS — D571 Sickle-cell disease without crisis: Secondary | ICD-10-CM

## 2016-11-02 MED ORDER — OXYCODONE-ACETAMINOPHEN 10-325 MG PO TABS: 1.0000 | ORAL_TABLET | Freq: Four times a day (QID) | ORAL | 0 refills | Status: DC | PRN

## 2016-11-16 ENCOUNTER — Telehealth: Payer: Self-pay

## 2016-11-20 ENCOUNTER — Other Ambulatory Visit: Payer: Self-pay | Admitting: Internal Medicine

## 2016-11-20 DIAGNOSIS — D571 Sickle-cell disease without crisis: Secondary | ICD-10-CM

## 2016-11-20 MED ORDER — FENTANYL 12 MCG/HR TD PT72: 12.5000 ug | MEDICATED_PATCH | TRANSDERMAL | 0 refills | Status: DC

## 2016-11-22 ENCOUNTER — Other Ambulatory Visit: Payer: Self-pay | Admitting: Internal Medicine

## 2016-11-23 ENCOUNTER — Telehealth: Payer: Self-pay

## 2016-11-27 ENCOUNTER — Other Ambulatory Visit: Payer: Self-pay | Admitting: Internal Medicine

## 2016-11-27 DIAGNOSIS — D571 Sickle-cell disease without crisis: Secondary | ICD-10-CM

## 2016-11-27 MED ORDER — OXYCODONE-ACETAMINOPHEN 10-325 MG PO TABS: 1.0000 | ORAL_TABLET | Freq: Four times a day (QID) | ORAL | 0 refills | Status: DC | PRN

## 2016-12-17 ENCOUNTER — Telehealth: Payer: Self-pay

## 2016-12-18 ENCOUNTER — Other Ambulatory Visit: Payer: Self-pay | Admitting: Internal Medicine

## 2016-12-18 DIAGNOSIS — D571 Sickle-cell disease without crisis: Secondary | ICD-10-CM

## 2016-12-18 MED ORDER — FENTANYL 12 MCG/HR TD PT72: 12.5000 ug | MEDICATED_PATCH | TRANSDERMAL | 0 refills | Status: DC

## 2016-12-18 MED ORDER — OXYCODONE-ACETAMINOPHEN 10-325 MG PO TABS: 1.0000 | ORAL_TABLET | Freq: Four times a day (QID) | ORAL | 0 refills | Status: DC | PRN

## 2017-01-04 ENCOUNTER — Telehealth: Payer: Self-pay

## 2017-01-05 ENCOUNTER — Other Ambulatory Visit: Payer: Self-pay | Admitting: Internal Medicine

## 2017-01-05 DIAGNOSIS — D571 Sickle-cell disease without crisis: Secondary | ICD-10-CM

## 2017-01-05 MED ORDER — OXYCODONE-ACETAMINOPHEN 10-325 MG PO TABS: 1.0000 | ORAL_TABLET | Freq: Four times a day (QID) | ORAL | 0 refills | Status: DC | PRN

## 2017-01-17 ENCOUNTER — Other Ambulatory Visit: Payer: Self-pay | Admitting: Internal Medicine

## 2017-01-17 ENCOUNTER — Telehealth: Payer: Self-pay

## 2017-01-17 DIAGNOSIS — D571 Sickle-cell disease without crisis: Secondary | ICD-10-CM

## 2017-01-17 MED ORDER — FENTANYL 12 MCG/HR TD PT72: 12.5000 ug | MEDICATED_PATCH | TRANSDERMAL | 0 refills | Status: AC

## 2017-01-17 MED ORDER — FENTANYL 12 MCG/HR TD PT72: 12.5000 ug | MEDICATED_PATCH | TRANSDERMAL | 0 refills | Status: DC

## 2017-01-22 ENCOUNTER — Ambulatory Visit: Payer: Medicaid Other | Admitting: Internal Medicine

## 2017-01-22 ENCOUNTER — Ambulatory Visit: Payer: Medicaid Other | Admitting: Family Medicine

## 2017-01-25 ENCOUNTER — Telehealth: Payer: Self-pay

## 2017-01-29 ENCOUNTER — Other Ambulatory Visit: Payer: Self-pay | Admitting: Internal Medicine

## 2017-01-29 DIAGNOSIS — D571 Sickle-cell disease without crisis: Secondary | ICD-10-CM

## 2017-01-29 MED ORDER — OXYCODONE-ACETAMINOPHEN 10-325 MG PO TABS: 1.0000 | ORAL_TABLET | Freq: Four times a day (QID) | ORAL | 0 refills | Status: DC | PRN

## 2017-01-31 ENCOUNTER — Other Ambulatory Visit: Payer: Self-pay | Admitting: Internal Medicine

## 2017-02-12 ENCOUNTER — Telehealth: Payer: Self-pay

## 2017-02-15 ENCOUNTER — Ambulatory Visit: Payer: Medicaid Other | Admitting: Family Medicine

## 2017-02-19 ENCOUNTER — Ambulatory Visit: Payer: Medicaid Other | Admitting: Family Medicine

## 2017-02-21 ENCOUNTER — Ambulatory Visit (INDEPENDENT_AMBULATORY_CARE_PROVIDER_SITE_OTHER): Payer: Medicaid Other | Admitting: Family Medicine

## 2017-02-21 ENCOUNTER — Encounter: Payer: Self-pay | Admitting: Family Medicine

## 2017-02-21 VITALS — BP 137/73 | HR 60 | Temp 98.0°F | Resp 14 | Ht 74.0 in | Wt 137.0 lb

## 2017-02-21 DIAGNOSIS — E559 Vitamin D deficiency, unspecified: Secondary | ICD-10-CM

## 2017-02-21 DIAGNOSIS — T2124XD Burn of second degree of lower back, subsequent encounter: Secondary | ICD-10-CM | POA: Diagnosis not present

## 2017-02-21 DIAGNOSIS — D571 Sickle-cell disease without crisis: Secondary | ICD-10-CM

## 2017-02-21 DIAGNOSIS — Z79891 Long term (current) use of opiate analgesic: Secondary | ICD-10-CM

## 2017-02-21 LAB — COMPLETE METABOLIC PANEL WITH GFR
ALT: 7 U/L — ABNORMAL LOW (ref 9–46)
AST: 14 U/L (ref 10–40)
Albumin: 4.3 g/dL (ref 3.6–5.1)
Alkaline Phosphatase: 67 U/L (ref 40–115)
BUN: 6 mg/dL — AB (ref 7–25)
CALCIUM: 9.5 mg/dL (ref 8.6–10.3)
CHLORIDE: 106 mmol/L (ref 98–110)
CO2: 28 mmol/L (ref 20–31)
Creat: 0.98 mg/dL (ref 0.60–1.35)
GFR, Est Non African American: >89 mL/min (ref >=60)
Glucose, Bld: 79 mg/dL (ref 65–99)
POTASSIUM: 4.2 mmol/L (ref 3.5–5.3)
Sodium: 140 mmol/L (ref 135–146)
Total Bilirubin: 0.3 mg/dL (ref 0.2–1.2)
Total Protein: 7.3 g/dL (ref 6.1–8.1)

## 2017-02-21 LAB — CBC WITH DIFFERENTIAL/PLATELET
BASOS PCT: 1 %
Basophils Absolute: 62 cells/uL (ref 0–200)
Eosinophils Absolute: 62 cells/uL (ref 15–500)
Eosinophils Relative: 1 %
HEMATOCRIT: 43.3 % (ref 38.5–50.0)
HEMOGLOBIN: 13.8 g/dL (ref 13.2–17.1)
LYMPHS ABS: 1426 {cells}/uL (ref 850–3900)
Lymphocytes Relative: 23 %
MCH: 25.7 pg — ABNORMAL LOW (ref 27.0–33.0)
MCHC: 31.9 g/dL — ABNORMAL LOW (ref 32.0–36.0)
MCV: 80.6 fL (ref 80.0–100.0)
MONO ABS: 372 {cells}/uL (ref 200–950)
MPV: 10 fL (ref 7.5–12.5)
Monocytes Relative: 6 %
Neutro Abs: 4278 cells/uL (ref 1500–7800)
Neutrophils Relative %: 69 %
Platelets: 261 10*3/uL (ref 140–400)
RBC: 5.37 MIL/uL (ref 4.20–5.80)
RDW: 16.3 % — ABNORMAL HIGH (ref 11.0–15.0)
WBC: 6.2 10*3/uL (ref 3.8–10.8)

## 2017-02-21 LAB — RETICULOCYTES
ABS RETIC: 75180 {cells}/uL (ref 25000–90000)
RBC.: 5.37 MIL/uL (ref 4.20–5.80)
Retic Ct Pct: 1.4 %

## 2017-02-21 MED ORDER — L-GLUTAMINE ORAL POWDER: 10.0000 g | PACK | Freq: Two times a day (BID) | ORAL | 5 refills | Status: DC

## 2017-02-21 MED ORDER — FENTANYL 12 MCG/HR TD PT72: 12.5000 ug | MEDICATED_PATCH | TRANSDERMAL | 0 refills | Status: DC

## 2017-02-21 MED ORDER — SILVER SULFADIAZINE 1 % EX CREA: 1.0000 "application " | TOPICAL_CREAM | Freq: Every day | CUTANEOUS | 0 refills | Status: DC

## 2017-02-21 MED ORDER — FOLIC ACID 1 MG PO TABS: 1.0000 mg | ORAL_TABLET | Freq: Every day | ORAL | 11 refills | Status: AC

## 2017-02-21 MED ORDER — HYDROXYUREA 500 MG PO CAPS: 1000.0000 mg | ORAL_CAPSULE | Freq: Every day | ORAL | 5 refills | Status: AC

## 2017-02-21 MED ORDER — OXYCODONE-ACETAMINOPHEN 10-325 MG PO TABS: 1.0000 | ORAL_TABLET | Freq: Four times a day (QID) | ORAL | 0 refills | Status: DC | PRN
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
## 2017-02-21 NOTE — Patient Instructions (Addendum)
Sickle cell anemia:  Will increase Hydroxyurea 1000 mg daily We discussed in detail that hydroxyurea has been found to be effective in reducing VOC, transfusions, and admissions for pain. It also reduces the duration of pain episodes by at least 50%. It depends on compliance. Compliance can be assessed by a rise in [facility] and drop in WBC/retic/ANC, and platelet count. It typically causes a rise in hemoglobin F, which can be assessed via hemoglobinopathy. It takes 3-6 months to see optimal effects of hydroxyurea therapy.      Will start Endari 10 g twice daily (in the drink of your choice.  The findings showed a 25% reduction in the frequency of sickle-cell crises, a 33% decrease in hospitalisation rates, fewer hospital visits due to sickle-cell pain and 60% fewer occurrences of acute chest syndrome in patients administered with Endari.     Sickle Cell Anemia, Adult Sickle cell anemia is a condition in which red blood cells have an abnormal "sickle" shape. This abnormal shape shortens the cells' life span, which results in a lower than normal concentration of red blood cells in the blood. The sickle shape also causes the cells to clump together and block free blood flow through the blood vessels. As a result, the tissues and organs of the body do not receive enough oxygen. Sickle cell anemia causes organ damage and pain and increases the risk of infection. What are the causes? Sickle cell anemia is a genetic disorder. Those who receive two copies of the gene have the condition, and those who receive one copy have the trait. What increases the risk? The sickle cell gene is most common in people whose families originated in [place]. Other areas of the globe where sickle cell trait occurs include the [place], [place]. What are the signs or symptoms?  Pain, especially in the extremities, back, chest, or abdomen (common). The pain may start  suddenly or may develop following an illness, especially if there is dehydration. Pain can also occur due to overexertion or exposure to extreme temperature changes.  Frequent severe bacterial infections, especially certain types of pneumonia and meningitis.  Pain and swelling in the hands and feet.  Decreased activity.  Loss of appetite.  Change in behavior.  Headaches.  Seizures.  Shortness of breath or difficulty breathing.  Vision changes.  Skin ulcers. Those with the trait may not have symptoms or they may have mild symptoms. How is this diagnosed? Sickle cell anemia is diagnosed with blood tests that demonstrate the genetic trait. It is often diagnosed during the newborn period, due to mandatory testing nationwide. A variety of blood tests, X-rays, CT scans, MRI scans, ultrasounds, and lung function tests may also be done to monitor the condition. How is this treated? Sickle cell anemia may be treated with:  Medicines. You may be given pain medicines, antibiotic medicines (to treat and prevent infections) or medicines to increase the production of certain types of hemoglobin.  Fluids.  Oxygen.  Blood transfusions. Follow these instructions at home:  Drink enough fluid to keep your urine clear or pale yellow. Increase your fluid intake in hot weather and during exercise.  Do not smoke. Smoking lowers oxygen levels in the blood.  Only take over-the-counter or prescription medicines for pain, fever, or discomfort as directed by your health care provider.  Take antibiotics as directed by your health care provider. Make sure you finish them it even if you start to feel better.  Take  supplements as directed by your health care provider.  Consider wearing a medical alert bracelet. This tells anyone caring for you in an emergency of your condition.  When traveling, keep your medical information, health care provider's names, and the medicines you take with you at all  times.  If you develop a fever, do not take medicines to reduce the fever right away. This could cover up a problem that is developing. Notify your health care provider.  Keep all follow-up appointments with your health care provider. Sickle cell anemia requires regular medical care. Contact a health care provider if: You have a fever. Get help right away if:  You feel dizzy or faint.  You have new abdominal pain, especially on the left side near the stomach area.  You develop a persistent, often uncomfortable and painful penile erection (priapism). If this is not treated immediately it will lead to impotence.  You have numbness your arms or legs or you have a hard time moving them.  You have a hard time with speech.  You have a fever or persistent symptoms for more than 2-3 days.  You have a fever and your symptoms suddenly get worse.  You have signs or symptoms of infection. These include:  Chills.  Abnormal tiredness (lethargy).  Irritability.  Poor eating.  Vomiting.  You develop pain that is not helped with medicine.  You develop shortness of breath.  You have pain in your chest.  You are coughing up pus-like or bloody sputum.  You develop a stiff neck.  Your feet or hands swell or have pain.  Your abdomen appears bloated.  You develop joint pain. This information is not intended to replace advice given to you by your health care provider. Make sure you discuss any questions you have with your health care provider. Document Released: 02/06/2006 Document Revised: 05/18/2016 Document Reviewed: 06/10/2013 Elsevier Interactive Patient Education  2017 [employer].

## 2017-02-22 LAB — VITAMIN D 25 HYDROXY (VIT D DEFICIENCY, FRACTURES): VIT D 25 HYDROXY: 16 ng/mL — AB (ref 30–100)

## 2017-02-25 ENCOUNTER — Other Ambulatory Visit: Payer: Self-pay | Admitting: Family Medicine

## 2017-02-25 DIAGNOSIS — E559 Vitamin D deficiency, unspecified: Secondary | ICD-10-CM

## 2017-02-25 LAB — PAIN MGMT, PROFILE 8 W/CONF, U
6 ACETYLMORPHINE: NEGATIVE ng/mL (ref <10)
Alcohol Metabolites: POSITIVE ng/mL — AB (ref <500)
Amphetamines: NEGATIVE ng/mL (ref <500)
BUPRENORPHINE: NEGATIVE ng/mL (ref <5)
Benzodiazepines: NEGATIVE ng/mL (ref <100)
Cocaine Metabolite: NEGATIVE ng/mL (ref <150)
Creatinine: 98 mg/dL (ref >=20.0)
ETHYL SULFATE (ETS): 231 ng/mL — AB (ref <100)
Ethyl Glucuronide (ETG): 1416 ng/mL — ABNORMAL HIGH (ref <500)
MARIJUANA METABOLITE: 221 ng/mL — AB (ref <5)
MARIJUANA METABOLITE: POSITIVE ng/mL — AB (ref <20)
MDMA: NEGATIVE ng/mL (ref <500)
OXYCODONE: NEGATIVE ng/mL (ref <100)
Opiates: NEGATIVE ng/mL (ref <100)
Oxidant: NEGATIVE ug/mL (ref <200)
PLEASE NOTE: 0
pH: 6.18 (ref 4.5–9.0)

## 2017-02-25 MED ORDER — ERGOCALCIFEROL 1.25 MG (50000 UT) PO CAPS: 50000.0000 [IU] | ORAL_CAPSULE | ORAL | 12 refills | Status: AC

## 2017-02-25 NOTE — Progress Notes (Signed)
Meds ordered this encounter  Medications  . ergocalciferol (DRISDOL) 50000 units capsule    Sig: Take 1 capsule (50,000 Units total) by mouth once a week.    Dispense:  4 capsule    Refill:  12

## 2017-02-27 ENCOUNTER — Encounter: Payer: Self-pay | Admitting: Family Medicine

## 2017-02-27 ENCOUNTER — Telehealth: Payer: Self-pay

## 2017-03-01 ENCOUNTER — Other Ambulatory Visit: Payer: Self-pay | Admitting: Family Medicine

## 2017-03-07 ENCOUNTER — Ambulatory Visit: Payer: Medicaid Other | Admitting: Family Medicine

## 2017-03-14 ENCOUNTER — Encounter: Payer: Self-pay | Admitting: Family Medicine

## 2017-03-14 ENCOUNTER — Ambulatory Visit (INDEPENDENT_AMBULATORY_CARE_PROVIDER_SITE_OTHER): Payer: Medicaid Other | Admitting: Family Medicine

## 2017-03-14 VITALS — BP 125/72 | HR 65 | Temp 97.9°F | Resp 16 | Ht 74.0 in | Wt 132.0 lb

## 2017-03-14 DIAGNOSIS — D571 Sickle-cell disease without crisis: Secondary | ICD-10-CM

## 2017-03-14 DIAGNOSIS — T2124XD Burn of second degree of lower back, subsequent encounter: Secondary | ICD-10-CM

## 2017-03-14 MED ORDER — OXYCODONE-ACETAMINOPHEN 10-325 MG PO TABS: 1.0000 | ORAL_TABLET | Freq: Four times a day (QID) | ORAL | 0 refills | Status: DC | PRN

## 2017-03-14 MED ORDER — SILVER SULFADIAZINE 1 % EX CREA: 1.0000 "application " | TOPICAL_CREAM | Freq: Every day | CUTANEOUS | 1 refills | Status: DC
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
## 2017-03-14 NOTE — Patient Instructions (Addendum)
Continue Silvadene cream to back twice daily.  Refrain from using heating pad or scratching area  Increase protein intake to promote skin healing  Sickle cell anemia:  Increase hydration to 64 ounces Recommend a balanced diet Continue Endari and hydrea as previously prescribed     Burn Care, Adult A burn is an injury to the skin or the tissues under the skin. There are three types of burns:  First degree. These burns may cause the skin to be red and a bit swollen.  Second degree. These burns are very painful and cause the skin to be very red. The skin may also leak fluid, look shiny, and start to have blisters.  Third degree. These burns cause permanent damage. They turn the skin white or black and make it look charred, dry, and leathery. Taking care of your burn properly can help to prevent pain and infection. It can also help the burn to heal more quickly. How is this treated? Right after a burn:   Lightly cover the burn with gauze.  Get medical help right away. Burn care   Raise (elevate) the injured area above the level of your heart while sitting or lying down.  Drink enough fluid to keep your pee (urine) clear or pale yellow.  Rest as told by your doctor. Do not start playing sports or doing other physical activities until your doctor says it is okay.  Follow instructions from your doctor about:  How to clean and take care of the burn.  When to change and remove the bandage (dressing).  Check your burn every day for signs of infection. Check for:  More redness, swelling, or pain.  Warmth.  Pus or a bad smell. Medicine   Take over-the-counter and prescription medicines only as told by your doctor.  If you were prescribed antibiotic medicine, take or apply it as told by your doctor. Do not stop using the antibiotic even if your condition improves. General instructions   To prevent infection:  Do not put butter, oil, or other home treatments on the  burn.  Do not scratch or pick at the burn.  Do not break any blisters.  Do not peel skin.  Do not rub your burn, even when you are cleaning it.  Protect your burn from the sun.  Keep all follow-up visits as told by your doctor. This is important. Contact a doctor if:  Your condition does not get better.  Your condition gets worse.  You have a fever.  Your burn looks different or starts to have black or red spots on it.  Your burn feels warm to the touch.  Your pain is not controlled with medicine. Get help right away if:  You have redness, swelling, or pain at the site of the burn.  You have fluid, blood, or pus coming from your burn.  You have red streaks near the burn.  You have very bad pain. How is this treated? Right after a burn:  Rinse or soak the burn under cool water until the pain stops. Do not put ice on your burn. That can cause more damage.  Lightly cover the burn with a clean (sterile) cloth (dressing). Burn care  Follow instructions from your doctor about:  How to clean and take care of the burn.  When to change and remove the cloth.  Check your burn every day for signs of infection. Check for:  More redness, swelling, or pain.  Warmth.  Pus or a bad smell.  Medicine  Take over-the-counter and prescription medicines only as told by your doctor.  If you were prescribed antibiotic medicine, take or apply it as told by your doctor. Do not stop using the antibiotic even if your condition improves. General instructions  To prevent infection, do not put butter, oil, or other home treatments on your burn.  Do not rub your burn, even when you are cleaning it.  Protect your burn from the sun. How is this treated? Right after a burn:  Rinse or soak the burn under cool water. Do this for several minutes. Do not put ice on your burn. That can cause more damage.  Lightly cover the burn with a clean (sterile) cloth (dressing). Burn  care  Raise (elevate) the injured area above the level of your heart while sitting or lying down.  Follow instructions from your doctor about:  How to clean and take care of the burn.  When to change and remove the cloth.  Check your burn every day for signs of infection. Check for:  More redness, swelling, or pain.  Warmth.  Pus or a bad smell. Medicine   Take over-the-counter and prescription medicines only as told by your doctor.  If you were prescribed antibiotic medicine, take or apply it as told by your doctor. Do not stop using the antibiotic even if your condition improves. General instructions  To prevent infection:  Do not put butter, oil, or other home treatments on the burn.  Do not scratch or pick at the burn.  Do not break any blisters.  Do not peel skin.  Do not rub your burn, even when you are cleaning it.  Protect your burn from the sun. This information is not intended to replace advice given to you by your health care provider. Make sure you discuss any questions you have with your health care provider. Document Released: 08/07/2008 Document Revised: 05/20/2016 Document Reviewed: 04/17/2016 Elsevier Interactive Patient Education  2017 [employer]  Sickle Cell Anemia, Adult Sickle cell anemia is a condition where your red blood cells are shaped like sickles. Red blood cells carry oxygen through the body. Sickle-shaped red blood cells do not live as long as normal red blood cells. They also clump together and block blood from flowing through the blood vessels. These things prevent the body from getting enough oxygen. Sickle cell anemia causes organ damage and pain. It also increases the risk of infection. Follow these instructions at home:  Drink enough fluid to keep your pee (urine) clear or pale yellow. Drink more in hot weather and during exercise.  Do not smoke. Smoking lowers oxygen levels in the blood.  Only take over-the-counter or  prescription medicines as told by your doctor.  Take antibiotic medicines as told by your doctor. Make sure you finish them even if you start to feel better.  Take supplements as told by your doctor.  Consider wearing a medical alert bracelet. This tells anyone caring for you in an emergency of your condition.  When traveling, keep your medical information, doctors' names, and the medicines you take with you at all times.  If you have a fever, do not take fever medicines right away. This could cover up a problem. Tell your doctor.  Keep all follow-up visits with your doctor. Sickle cell anemia requires regular medical care. Contact a doctor if: You have a fever. Get help right away if:  You feel dizzy or faint.  You have new belly (abdominal) pain, especially on the  left side near the stomach area.  You have a lasting, often uncomfortable and painful erection of the penis (priapism). If it is not treated right away, you will become unable to have sex (impotence).  You have numbness in your arms or legs or you have a hard time moving them.  You have a hard time talking.  You have a fever or lasting symptoms for more than 2-3 days.  You have a fever and your symptoms suddenly get worse.  You have signs or symptoms of infection. These include:  Chills.  Being more tired than normal (lethargy).  Irritability.  Poor eating.  Throwing up (vomiting).  You have pain that is not helped with medicine.  You have shortness of breath.  You have pain in your chest.  You are coughing up pus-like or bloody mucus.  You have a stiff neck.  Your feet or hands swell or have pain.  Your belly looks bloated.  Your joints hurt. This information is not intended to replace advice given to you by your health care provider. Make sure you discuss any questions you have with your health care provider. Document Released: 08/19/2013 Document Revised: 04/05/2016 Document Reviewed:  06/10/2013 Elsevier Interactive Patient Education  2017 [employer].

## 2017-03-20 ENCOUNTER — Telehealth: Payer: Self-pay

## 2017-03-22 ENCOUNTER — Other Ambulatory Visit: Payer: Self-pay | Admitting: Family Medicine

## 2017-03-22 DIAGNOSIS — D571 Sickle-cell disease without crisis: Secondary | ICD-10-CM

## 2017-03-22 MED ORDER — FENTANYL 12 MCG/HR TD PT72: 12.5000 ug | MEDICATED_PATCH | TRANSDERMAL | 0 refills | Status: DC

## 2017-03-22 NOTE — Progress Notes (Signed)
Reviewed [place] Substance Reporting system prior to prescribing opiate medications. No inconsistencies noted.   Meds ordered this encounter  Medications  . fentaNYL (DURAGESIC - DOSED MCG/HR) 12 MCG/HR    Sig: Place 1 patch (12.5 mcg total) onto the skin every 3 (three) days.    Dispense:  10 patch    Refill:  0    Order Specific Question:   Supervising Provider    Answer:   [name] [[id number]]     [name]  MSN, FNP-C [facility] [address] [place],  [address] [phone]

## 2017-03-28 ENCOUNTER — Telehealth: Payer: Self-pay

## 2017-04-02 ENCOUNTER — Telehealth: Payer: Self-pay

## 2017-04-02 ENCOUNTER — Other Ambulatory Visit: Payer: Self-pay | Admitting: Family Medicine

## 2017-04-02 ENCOUNTER — Other Ambulatory Visit: Payer: Self-pay | Admitting: Internal Medicine

## 2017-04-02 DIAGNOSIS — D571 Sickle-cell disease without crisis: Secondary | ICD-10-CM

## 2017-04-02 MED ORDER — OXYCODONE-ACETAMINOPHEN 10-325 MG PO TABS: 1.0000 | ORAL_TABLET | Freq: Four times a day (QID) | ORAL | 0 refills | Status: DC | PRN

## 2017-04-02 NOTE — Progress Notes (Signed)
Reviewed [place] Substance Reporting system prior to prescribing opiate medications. No inconsistencies noted.   Meds ordered this encounter  Medications  . oxyCODONE-acetaminophen (PERCOCET) 10-325 MG tablet    Sig: Take 1 tablet by mouth every 6 (six) hours as needed for pain.    Dispense:  90 tablet    Refill:  0    Order Specific Question:   Supervising Provider    Answer:   [name] [[id number]]     [name]  MSN, FNP-C [facility] [address] [place], [place] [address] [phone]

## 2017-04-18 ENCOUNTER — Encounter: Payer: Self-pay | Admitting: Family Medicine

## 2017-04-18 ENCOUNTER — Ambulatory Visit (INDEPENDENT_AMBULATORY_CARE_PROVIDER_SITE_OTHER): Payer: Medicaid Other | Admitting: Family Medicine

## 2017-04-18 DIAGNOSIS — D571 Sickle-cell disease without crisis: Secondary | ICD-10-CM

## 2017-04-18 MED ORDER — FENTANYL 12 MCG/HR TD PT72: 12.5000 ug | MEDICATED_PATCH | TRANSDERMAL | 0 refills | Status: DC

## 2017-04-18 MED ORDER — OXYCODONE-ACETAMINOPHEN 10-325 MG PO TABS: 1.0000 | ORAL_TABLET | Freq: Four times a day (QID) | ORAL | 0 refills | Status: DC | PRN

## 2017-04-18 NOTE — Patient Instructions (Addendum)
  Mederma, OTC   Sickle Cell Anemia, Adult Sickle cell anemia is a condition where your red blood cells are shaped like sickles. Red blood cells carry oxygen through the body. Sickle-shaped red blood cells do not live as long as normal red blood cells. They also clump together and block blood from flowing through the blood vessels. These things prevent the body from getting enough oxygen. Sickle cell anemia causes organ damage and pain. It also increases the risk of infection. Follow these instructions at home:  Drink enough fluid to keep your pee (urine) clear or pale yellow. Drink more in hot weather and during exercise.  Do not smoke. Smoking lowers oxygen levels in the blood.  Only take over-the-counter or prescription medicines as told by your doctor.  Take antibiotic medicines as told by your doctor. Make sure you finish them even if you start to feel better.  Take supplements as told by your doctor.  Consider wearing a medical alert bracelet. This tells anyone caring for you in an emergency of your condition.  When traveling, keep your medical information, doctors' names, and the medicines you take with you at all times.  If you have a fever, do not take fever medicines right away. This could cover up a problem. Tell your doctor.  Keep all follow-up visits with your doctor. Sickle cell anemia requires regular medical care. Contact a doctor if: You have a fever. Get help right away if:  You feel dizzy or faint.  You have new belly (abdominal) pain, especially on the left side near the stomach area.  You have a lasting, often uncomfortable and painful erection of the penis (priapism). If it is not treated right away, you will become unable to have sex (impotence).  You have numbness in your arms or legs or you have a hard time moving them.  You have a hard time talking.  You have a fever or lasting symptoms for more than 2-3 days.  You have a fever and your symptoms  suddenly get worse.  You have signs or symptoms of infection. These include: ? Chills. ? Being more tired than normal (lethargy). ? Irritability. ? Poor eating. ? Throwing up (vomiting).  You have pain that is not helped with medicine.  You have shortness of breath.  You have pain in your chest.  You are coughing up pus-like or bloody mucus.  You have a stiff neck.  Your feet or hands swell or have pain.  Your belly looks bloated.  Your joints hurt. This information is not intended to replace advice given to you by your health care provider. Make sure you discuss any questions you have with your health care provider. Document Released: 08/19/2013 Document Revised: 04/05/2016 Document Reviewed: 06/10/2013 Elsevier Interactive Patient Education  2017 [employer].
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
## 2017-05-09 ENCOUNTER — Other Ambulatory Visit: Payer: Self-pay | Admitting: Internal Medicine

## 2017-05-09 ENCOUNTER — Telehealth: Payer: Self-pay

## 2017-05-09 DIAGNOSIS — D571 Sickle-cell disease without crisis: Secondary | ICD-10-CM

## 2017-05-09 MED ORDER — OXYCODONE-ACETAMINOPHEN 10-325 MG PO TABS: 1.0000 | ORAL_TABLET | Freq: Four times a day (QID) | ORAL | 0 refills | Status: DC | PRN

## 2017-05-20 ENCOUNTER — Telehealth: Payer: Self-pay

## 2017-05-21 ENCOUNTER — Other Ambulatory Visit: Payer: Self-pay | Admitting: Internal Medicine

## 2017-05-21 DIAGNOSIS — D571 Sickle-cell disease without crisis: Secondary | ICD-10-CM

## 2017-05-21 MED ORDER — FENTANYL 12 MCG/HR TD PT72
12.5000 ug | MEDICATED_PATCH | TRANSDERMAL | 0 refills | Status: DC
Start: 2017-05-25 — End: 2017-06-25

## 2017-05-30 ENCOUNTER — Ambulatory Visit (INDEPENDENT_AMBULATORY_CARE_PROVIDER_SITE_OTHER): Payer: Medicaid Other | Admitting: Family Medicine

## 2017-05-30 ENCOUNTER — Encounter: Payer: Self-pay | Admitting: Family Medicine

## 2017-05-30 VITALS — BP 96/68 | HR 64 | Temp 99.0°F | Ht 74.0 in | Wt 129.0 lb

## 2017-05-30 DIAGNOSIS — Z79891 Long term (current) use of opiate analgesic: Secondary | ICD-10-CM

## 2017-05-30 DIAGNOSIS — T2124XD Burn of second degree of lower back, subsequent encounter: Secondary | ICD-10-CM | POA: Diagnosis not present

## 2017-05-30 DIAGNOSIS — D571 Sickle-cell disease without crisis: Secondary | ICD-10-CM

## 2017-05-30 LAB — POCT URINALYSIS DIP (DEVICE)
Bilirubin Urine: NEGATIVE
GLUCOSE, UA: NEGATIVE mg/dL
HGB URINE DIPSTICK: NEGATIVE
KETONES UR: 15 mg/dL — AB
LEUKOCYTES UA: NEGATIVE
Nitrite: NEGATIVE
PROTEIN: NEGATIVE mg/dL
SPECIFIC GRAVITY, URINE: 1.01 (ref 1.005–1.030)
UROBILINOGEN UA: 0.2 mg/dL (ref 0.0–1.0)
pH: 6 (ref 5.0–8.0)

## 2017-05-30 LAB — RETICULOCYTES
ABS RETIC: 50580 {cells}/uL (ref 25000–90000)
RBC.: 5.62 MIL/uL (ref 4.20–5.80)
Retic Ct Pct: 0.9 %

## 2017-05-30 LAB — CBC WITH DIFFERENTIAL/PLATELET
BASOS PCT: 0 %
Basophils Absolute: 0 cells/uL (ref 0–200)
EOS ABS: 110 {cells}/uL (ref 15–500)
Eosinophils Relative: 2 %
HEMATOCRIT: 45.3 % (ref 38.5–50.0)
HEMOGLOBIN: 15.1 g/dL (ref 13.2–17.1)
LYMPHS ABS: 1320 {cells}/uL (ref 850–3900)
LYMPHS PCT: 24 %
MCH: 26.9 pg — ABNORMAL LOW (ref 27.0–33.0)
MCHC: 33.3 g/dL (ref 32.0–36.0)
MCV: 80.6 fL (ref 80.0–100.0)
MONO ABS: 275 {cells}/uL (ref 200–950)
MPV: 9.8 fL (ref 7.5–12.5)
Monocytes Relative: 5 %
NEUTROS ABS: 3795 {cells}/uL (ref 1500–7800)
Neutrophils Relative %: 69 %
Platelets: 262 10*3/uL (ref 140–400)
RBC: 5.62 MIL/uL (ref 4.20–5.80)
RDW: 15.7 % — AB (ref 11.0–15.0)
WBC: 5.5 10*3/uL (ref 3.8–10.8)

## 2017-05-30 MED ORDER — SILVER SULFADIAZINE 1 % EX CREA: 1.0000 "application " | TOPICAL_CREAM | Freq: Every day | CUTANEOUS | 1 refills | Status: AC

## 2017-05-31 ENCOUNTER — Telehealth: Payer: Self-pay

## 2017-05-31 LAB — COMPLETE METABOLIC PANEL WITH GFR
ALT: 8 U/L — AB (ref 9–46)
AST: 17 U/L (ref 10–40)
Albumin: 4.2 g/dL (ref 3.6–5.1)
Alkaline Phosphatase: 63 U/L (ref 40–115)
BUN: 10 mg/dL (ref 7–25)
CALCIUM: 9.6 mg/dL (ref 8.6–10.3)
CHLORIDE: 104 mmol/L (ref 98–110)
CO2: 18 mmol/L — ABNORMAL LOW (ref 20–31)
CREATININE: 1.09 mg/dL (ref 0.60–1.35)
GFR, Est Non African American: >89 mL/min (ref >=60)
Glucose, Bld: 69 mg/dL (ref 65–99)
POTASSIUM: 4.3 mmol/L (ref 3.5–5.3)
Sodium: 142 mmol/L (ref 135–146)
Total Bilirubin: 0.7 mg/dL (ref 0.2–1.2)
Total Protein: 7.8 g/dL (ref 6.1–8.1)

## 2017-06-01 ENCOUNTER — Other Ambulatory Visit: Payer: Self-pay | Admitting: Family Medicine

## 2017-06-02 LAB — PAIN MGMT, PROFILE 8 W/CONF, U
6 ACETYLMORPHINE: NEGATIVE ng/mL (ref <10)
AMPHETAMINES: NEGATIVE ng/mL (ref <500)
Alcohol Metabolites: NEGATIVE ng/mL (ref <500)
BENZODIAZEPINES: NEGATIVE ng/mL (ref <100)
BUPRENORPHINE: NEGATIVE ng/mL (ref <5)
COCAINE METABOLITE: NEGATIVE ng/mL (ref <150)
CREATININE: 136.4 mg/dL (ref >=20.0)
MARIJUANA METABOLITE: POSITIVE ng/mL — AB (ref <20)
MDMA: NEGATIVE ng/mL (ref <500)
Marijuana Metabolite: 110 ng/mL — ABNORMAL HIGH (ref <5)
OXYCODONE: NEGATIVE ng/mL (ref <100)
Opiates: NEGATIVE ng/mL (ref <100)
Oxidant: NEGATIVE ug/mL (ref <200)
PH: 7 (ref 4.5–9.0)
Please note:: 0
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
## 2017-06-02 NOTE — Patient Instructions (Signed)
No medication changes warranted.  I will follow up by phone with any abnormal laboratory results.  Refrain from applying heating pad to lower back Apply silvadene cream to lower back daily.  I sent a referral to dermatology for further workup and evaluation.  Sickle Cell Anemia, Adult Sickle cell anemia is a condition where your red blood cells are shaped like sickles. Red blood cells carry oxygen through the body. Sickle-shaped red blood cells do not live as long as normal red blood cells. They also clump together and block blood from flowing through the blood vessels. These things prevent the body from getting enough oxygen. Sickle cell anemia causes organ damage and pain. It also increases the risk of infection. Follow these instructions at home:  Drink enough fluid to keep your pee (urine) clear or pale yellow. Drink more in hot weather and during exercise.  Do not smoke. Smoking lowers oxygen levels in the blood.  Only take over-the-counter or prescription medicines as told by your doctor.  Take antibiotic medicines as told by your doctor. Make sure you finish them even if you start to feel better.  Take supplements as told by your doctor.  Consider wearing a medical alert bracelet. This tells anyone caring for you in an emergency of your condition.  When traveling, keep your medical information, doctors' names, and the medicines you take with you at all times.  If you have a fever, do not take fever medicines right away. This could cover up a problem. Tell your doctor.  Keep all follow-up visits with your doctor. Sickle cell anemia requires regular medical care. Contact a doctor if: You have a fever. Get help right away if:  You feel dizzy or faint.  You have new belly (abdominal) pain, especially on the left side near the stomach area.  You have a lasting, often uncomfortable and painful erection of the penis (priapism). If it is not treated right away, you will become unable  to have sex (impotence).  You have numbness in your arms or legs or you have a hard time moving them.  You have a hard time talking.  You have a fever or lasting symptoms for more than 2-3 days.  You have a fever and your symptoms suddenly get worse.  You have signs or symptoms of infection. These include: ? Chills. ? Being more tired than normal (lethargy). ? Irritability. ? Poor eating. ? Throwing up (vomiting).  You have pain that is not helped with medicine.  You have shortness of breath.  You have pain in your chest.  You are coughing up pus-like or bloody mucus.  You have a stiff neck.  Your feet or hands swell or have pain.  Your belly looks bloated.  Your joints hurt. This information is not intended to replace advice given to you by your health care provider. Make sure you discuss any questions you have with your health care provider. Document Released: 08/19/2013 Document Revised: 04/05/2016 Document Reviewed: 06/10/2013 Elsevier Interactive Patient Education  2017 [employer].

## 2017-06-03 ENCOUNTER — Telehealth: Payer: Self-pay

## 2017-06-03 NOTE — Telephone Encounter (Signed)
-----   Message from Dorena Dew, [place] sent at 06/01/2017 10:03 AM EDT ----- Regarding: lab results Please remind James Hamilton to take hydroxyurea consistently, labs indicate that he is not taking medications as prescribed. We discussed in detail that hydroxyurea has been found to be effective in reducing VOC, transfusions, and admissions for pain previously.. It also reduces the duration of pain episodes by at least 50%. It depends on compliance. Compliance can be assessed by a rise in [facility] and drop in WBC/retic/ANC, and platelet count.   I will recheck laboratory values on next month.    Thanks

## 2017-06-03 NOTE — Telephone Encounter (Signed)
Called, no answer. Left message to call back. Thanks!  

## 2017-06-03 NOTE — Telephone Encounter (Signed)
Patient called back. I advised to take hydroxyurea every day as prescribed, explained the importance of taking consistently to improve the effectiveness. Advised that we will recheck in 1 month. Patient verbalized understanding and had no questions at this time. Thanks!

## 2017-06-04 ENCOUNTER — Other Ambulatory Visit: Payer: Medicaid Other

## 2017-06-04 ENCOUNTER — Other Ambulatory Visit: Payer: Self-pay | Admitting: Family Medicine

## 2017-06-04 ENCOUNTER — Telehealth: Payer: Self-pay | Admitting: Family Medicine

## 2017-06-04 DIAGNOSIS — Z79891 Long term (current) use of opiate analgesic: Secondary | ICD-10-CM

## 2017-06-04 DIAGNOSIS — G894 Chronic pain syndrome: Secondary | ICD-10-CM

## 2017-06-04 NOTE — Progress Notes (Signed)
Reviewed recent laboratory values. Urine drug profile has been consistently negative for opiate metabolites. Will follow up by phone after reviewing drug screen.   [name]  MSN, FNP-C [place] [address] [place], [place] [address] [phone]

## 2017-06-04 NOTE — Telephone Encounter (Signed)
Note opened in error.   [name]  MSN, FNP-C [place] [address] [place], [place] [address] [phone]

## 2017-06-04 NOTE — Progress Notes (Unsigned)
pao

## 2017-06-05 ENCOUNTER — Other Ambulatory Visit: Payer: Self-pay | Admitting: Family Medicine

## 2017-06-05 ENCOUNTER — Telehealth: Payer: Self-pay | Admitting: Family Medicine

## 2017-06-05 DIAGNOSIS — D571 Sickle-cell disease without crisis: Secondary | ICD-10-CM

## 2017-06-05 MED ORDER — OXYCODONE-ACETAMINOPHEN 10-325 MG PO TABS: 1.0000 | ORAL_TABLET | Freq: Four times a day (QID) | ORAL | 0 refills | Status: DC | PRN

## 2017-06-05 NOTE — Telephone Encounter (Signed)
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 Reviewed previous pain management profiles, past 2 have been negative for opiate medications. Will start weekly pill counts in office. Patient will receive Oxycodone-acetaminophen 10-325 mg every 6 hours for moderate to severe pain # 28. Will follow up in office on 06/11/2017 at 11:30 for a pill count.   [name]  MSN, FNP-C [place] [address] [place], [place] [address] [phone]

## 2017-06-09 LAB — DRUG ABUSE PANEL 10-50 NO CONF, U
AMPHETAMINES (1000 ng/mL SCRN): NEGATIVE
BARBITURATES: NEGATIVE
BENZODIAZEPINES: NEGATIVE
COCAINE METABOLITES: NEGATIVE
MARIJUANA MET (50 ng/mL SCRN): POSITIVE — AB
METHADONE: NEGATIVE
METHAQUALONE: NEGATIVE
OPIATES: NEGATIVE
PHENCYCLIDINE: NEGATIVE
PROPOXYPHENE: NEGATIVE

## 2017-06-11 ENCOUNTER — Other Ambulatory Visit: Payer: Self-pay | Admitting: Family Medicine

## 2017-06-11 ENCOUNTER — Other Ambulatory Visit: Payer: Self-pay | Admitting: Internal Medicine

## 2017-06-11 ENCOUNTER — Ambulatory Visit: Payer: Medicaid Other

## 2017-06-11 DIAGNOSIS — Z79899 Other long term (current) drug therapy: Secondary | ICD-10-CM

## 2017-06-11 DIAGNOSIS — D571 Sickle-cell disease without crisis: Secondary | ICD-10-CM

## 2017-06-11 MED ORDER — OXYCODONE-ACETAMINOPHEN 10-325 MG PO TABS: 1.0000 | ORAL_TABLET | Freq: Four times a day (QID) | ORAL | 0 refills | Status: DC | PRN

## 2017-06-11 NOTE — Progress Notes (Signed)
Reviewed [place] Substance Reporting system prior to prescribing opiate medications. No inconsistencies noted.    Meds ordered this encounter  Medications  . oxyCODONE-acetaminophen (PERCOCET) 10-325 MG tablet    Sig: Take 1 tablet by mouth every 6 (six) hours as needed for pain.    Dispense:  28 tablet    Refill:  0    Order Specific Question:   Supervising Provider    Answer:   [name] [[id number]]     [name]  MSN, FNP-C [facility] [address] [place], [place] [address] [phone]

## 2017-06-11 NOTE — Progress Notes (Signed)
Patient brought in medication for a pill count. He had 3 tablets in the bottle. Bottle was labeled as Oxycodone/acetaminophen 10/325mg . The tablet was white oblong with a score mark in the middle, no imprint was on tablet.

## 2017-06-17 ENCOUNTER — Other Ambulatory Visit: Payer: Self-pay | Admitting: Family Medicine

## 2017-06-17 ENCOUNTER — Telehealth: Payer: Self-pay

## 2017-06-17 DIAGNOSIS — Z79891 Long term (current) use of opiate analgesic: Secondary | ICD-10-CM

## 2017-06-17 NOTE — Telephone Encounter (Signed)
Patient brought in medication for a pill count. He has 3 pills in a bottle labeled oxycodone acetaminophen 10/325mg . The pills are white oblong with the imprint "[id number]" on one side. The other side is blank and there are no score makes.

## 2017-06-22 LAB — PAIN MGMT, PROFILE 8 W/CONF, U
6 Acetylmorphine: NEGATIVE ng/mL (ref <10)
Alcohol Metabolites: NEGATIVE ng/mL (ref <500)
Amphetamines: NEGATIVE ng/mL (ref <500)
BENZODIAZEPINES: NEGATIVE ng/mL (ref <100)
BUPRENORPHINE: NEGATIVE ng/mL (ref <5)
Cocaine Metabolite: NEGATIVE ng/mL (ref <150)
Creatinine: 143.2 mg/dL (ref >=20.0)
MARIJUANA METABOLITE: 210 ng/mL — AB (ref <5)
MDMA: NEGATIVE ng/mL (ref <500)
Marijuana Metabolite: POSITIVE ng/mL — AB (ref <20)
NOROXYCODONE: 174 ng/mL — AB (ref <50)
OPIATES: NEGATIVE ng/mL (ref <100)
OXYCODONE: POSITIVE ng/mL — AB (ref <100)
Oxidant: NEGATIVE ug/mL (ref <200)
Oxycodone: NEGATIVE ng/mL (ref <50)
Oxymorphone: 296 ng/mL — ABNORMAL HIGH (ref <50)
PLEASE NOTE: 0
pH: 6.03 (ref 4.5–9.0)

## 2017-06-25 ENCOUNTER — Other Ambulatory Visit: Payer: Self-pay | Admitting: Internal Medicine

## 2017-06-25 ENCOUNTER — Telehealth: Payer: Self-pay

## 2017-06-25 DIAGNOSIS — D571 Sickle-cell disease without crisis: Secondary | ICD-10-CM

## 2017-06-25 MED ORDER — FENTANYL 12 MCG/HR TD PT72: 12.5000 ug | MEDICATED_PATCH | TRANSDERMAL | 0 refills | Status: AC

## 2017-06-25 MED ORDER — OXYCODONE-ACETAMINOPHEN 10-325 MG PO TABS: 1.0000 | ORAL_TABLET | Freq: Four times a day (QID) | ORAL | 0 refills | Status: AC | PRN

## 2017-07-01 ENCOUNTER — Encounter: Payer: Self-pay | Admitting: Family Medicine

## 2017-07-01 ENCOUNTER — Ambulatory Visit (INDEPENDENT_AMBULATORY_CARE_PROVIDER_SITE_OTHER): Payer: Medicaid Other | Admitting: Family Medicine

## 2017-07-01 VITALS — BP 120/76 | HR 55 | Temp 97.4°F | Resp 14 | Ht 74.0 in | Wt 131.0 lb

## 2017-07-01 DIAGNOSIS — D5700 Hb-SS disease with crisis, unspecified: Secondary | ICD-10-CM | POA: Diagnosis not present

## 2017-07-01 DIAGNOSIS — D571 Sickle-cell disease without crisis: Secondary | ICD-10-CM
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
## 2017-07-01 NOTE — Patient Instructions (Signed)
Sickle Cell Anemia, Adult °Sickle cell anemia is a condition where your red blood cells are shaped like sickles. Red blood cells carry oxygen through the body. Sickle-shaped red blood cells do not live as long as normal red blood cells. They also clump together and block blood from flowing through the blood vessels. These things prevent the body from getting enough oxygen. Sickle cell anemia causes organ damage and pain. It also increases the risk of infection. °Follow these instructions at home: °· Drink enough fluid to keep your pee (urine) clear or pale yellow. Drink more in hot weather and during exercise. °· Do not smoke. Smoking lowers oxygen levels in the blood. °· Only take over-the-counter or prescription medicines as told by your doctor. °· Take antibiotic medicines as told by your doctor. Make sure you finish them even if you start to feel better. °· Take supplements as told by your doctor. °· Consider wearing a medical alert bracelet. This tells anyone caring for you in an emergency of your condition. °· When traveling, keep your medical information, doctors' names, and the medicines you take with you at all times. °· If you have a fever, do not take fever medicines right away. This could cover up a problem. Tell your doctor. °· Keep all follow-up visits with your doctor. Sickle cell anemia requires regular medical care. °Contact a doctor if: °You have a fever. °Get help right away if: °· You feel dizzy or faint. °· You have new belly (abdominal) pain, especially on the left side near the stomach area. °· You have a lasting, often uncomfortable and painful erection of the penis (priapism). If it is not treated right away, you will become unable to have sex (impotence). °· You have numbness in your arms or legs or you have a hard time moving them. °· You have a hard time talking. °· You have a fever or lasting symptoms for more than 2-3 days. °· You have a fever and your symptoms suddenly get  worse. °· You have signs or symptoms of infection. These include: °? Chills. °? Being more tired than normal (lethargy). °? Irritability. °? Poor eating. °? Throwing up (vomiting). °· You have pain that is not helped with medicine. °· You have shortness of breath. °· You have pain in your chest. °· You are coughing up pus-like or bloody mucus. °· You have a stiff neck. °· Your feet or hands swell or have pain. °· Your belly looks bloated. °· Your joints hurt. °This information is not intended to replace advice given to you by your health care provider. Make sure you discuss any questions you have with your health care provider. °Document Released: 08/19/2013 Document Revised: 04/05/2016 Document Reviewed: 06/10/2013 °Elsevier Interactive Patient Education © 2017 [employer] ° °

## 2017-07-17 ENCOUNTER — Telehealth: Payer: Self-pay

## 2017-07-18 ENCOUNTER — Ambulatory Visit: Payer: Medicaid Other | Admitting: Family Medicine

## 2017-07-18 ENCOUNTER — Other Ambulatory Visit: Payer: Self-pay | Admitting: Family Medicine

## 2017-07-18 DIAGNOSIS — Z79891 Long term (current) use of opiate analgesic: Secondary | ICD-10-CM

## 2017-07-18 DIAGNOSIS — D571 Sickle-cell disease without crisis: Secondary | ICD-10-CM

## 2017-07-18 MED ORDER — OXYCODONE-ACETAMINOPHEN 10-325 MG PO TABS: 1.0000 | ORAL_TABLET | ORAL | 0 refills | Status: DC | PRN

## 2017-07-18 NOTE — Progress Notes (Signed)
Reviewed [place] Substance Reporting system prior to prescribing opiate medications. No inconsistencies noted.   Meds ordered this encounter  Medications  . oxyCODONE-acetaminophen (PERCOCET) 10-325 MG tablet    Sig: Take 1 tablet by mouth every 4 (four) hours as needed for pain.    Dispense:  90 tablet    Refill:  0    Order Specific Question:   Supervising Provider    Answer:   [name] [[id number]]     [name]  MSN, FNP-C Patient [place] [facility] [address] [place], [place] [address] [phone]

## 2017-07-25 ENCOUNTER — Telehealth: Payer: Self-pay

## 2017-07-25 ENCOUNTER — Other Ambulatory Visit: Payer: Self-pay | Admitting: Family Medicine

## 2017-07-25 DIAGNOSIS — D571 Sickle-cell disease without crisis: Secondary | ICD-10-CM

## 2017-07-25 DIAGNOSIS — Z79891 Long term (current) use of opiate analgesic: Secondary | ICD-10-CM

## 2017-07-25 MED ORDER — OXYCODONE-ACETAMINOPHEN 10-325 MG PO TABS: 1.0000 | ORAL_TABLET | ORAL | 0 refills | Status: DC | PRN

## 2017-07-25 NOTE — Telephone Encounter (Signed)
Patient is asking for a refill on his oxycodone 10/325mg . He states he only got 30 tablets off of previous prescription due to a problem with his insurance and he was leaving town and could not wait.  I have called [facility] on [address] and spoke with Lee-ann, she advised that this is correct patient picked up 30 tablets on 07/19/2017 and paid cash for the prescription. The remaining 60 tablets he would not be able to get and would need a new rx. Please advise. Thanks!

## 2017-07-25 NOTE — Progress Notes (Signed)
Patient was only able to get a partial prescription for opiate medication on 07/19/2017. The pharmacy is requiring a new prescription for the additional 60 tablets. Reviewed [place] Substance Reporting system prior to prescribing opiate medications. No inconsistencies noted.    Meds ordered this encounter  Medications  . oxyCODONE-acetaminophen (PERCOCET) 10-325 MG tablet    Sig: Take 1 tablet by mouth every 4 (four) hours as needed for pain.    Dispense:  60 tablet    Refill:  0    Order Specific Question:   Supervising Provider    Answer:   [name] [[id number]]     [name]  MSN, FNP-C Patient [facility] [address] [place], [place] [address] [phone]

## 2017-07-29 ENCOUNTER — Other Ambulatory Visit: Payer: Self-pay | Admitting: Family Medicine

## 2017-07-29 ENCOUNTER — Telehealth: Payer: Self-pay

## 2017-07-29 DIAGNOSIS — G894 Chronic pain syndrome: Secondary | ICD-10-CM

## 2017-07-29 DIAGNOSIS — D571 Sickle-cell disease without crisis: Secondary | ICD-10-CM

## 2017-07-29 MED ORDER — FENTANYL 12 MCG/HR TD PT72: 12.5000 ug | MEDICATED_PATCH | TRANSDERMAL | 0 refills | Status: DC

## 2017-07-29 NOTE — Progress Notes (Unsigned)
Reviewed [place] Substance Reporting system prior to prescribing opiate medications. No inconsistencies noted.   Meds ordered this encounter  Medications  . fentaNYL (DURAGESIC - DOSED MCG/HR) 12 MCG/HR    Sig: Place 1 patch (12.5 mcg total) onto the skin every 3 (three) days.    Dispense:  10 patch    Refill:  0    Order Specific Question:   Supervising Provider    Answer:   [name] [[id number]]     [name]  MSN, FNP-C Patient [facility] [address] [place], [place] [address] [phone]

## 2017-08-06 ENCOUNTER — Other Ambulatory Visit: Payer: Self-pay | Admitting: Internal Medicine

## 2017-08-06 ENCOUNTER — Telehealth: Payer: Self-pay

## 2017-08-06 DIAGNOSIS — Z79891 Long term (current) use of opiate analgesic: Secondary | ICD-10-CM

## 2017-08-06 DIAGNOSIS — D571 Sickle-cell disease without crisis: Secondary | ICD-10-CM

## 2017-08-06 MED ORDER — OXYCODONE-ACETAMINOPHEN 10-325 MG PO TABS: 1.0000 | ORAL_TABLET | ORAL | 0 refills | Status: DC | PRN

## 2017-08-19 ENCOUNTER — Other Ambulatory Visit: Payer: Self-pay | Admitting: Family Medicine

## 2017-08-19 ENCOUNTER — Telehealth: Payer: Self-pay

## 2017-08-19 DIAGNOSIS — D571 Sickle-cell disease without crisis: Secondary | ICD-10-CM

## 2017-08-19 DIAGNOSIS — Z79891 Long term (current) use of opiate analgesic: Secondary | ICD-10-CM

## 2017-08-19 MED ORDER — OXYCODONE-ACETAMINOPHEN 10-325 MG PO TABS: 1.0000 | ORAL_TABLET | Freq: Four times a day (QID) | ORAL | 0 refills | Status: DC | PRN

## 2017-08-19 NOTE — Progress Notes (Signed)
Reviewed [place] Substance Reporting system prior to prescribing opiate medications. No inconsistencies noted.   Meds ordered this encounter  Medications  . oxyCODONE-acetaminophen (PERCOCET) 10-325 MG tablet    Sig: Take 1 tablet by mouth every 6 (six) hours as needed for pain.    Dispense:  90 tablet    Refill:  0    Order Specific Question:   Supervising Provider    Answer:   [name] [[id number]]     [name]  MSN, FNP-C Patient [facility] [address] [place], [place] [address] [phone]

## 2017-08-20 ENCOUNTER — Other Ambulatory Visit: Payer: Medicaid Other

## 2017-08-23 ENCOUNTER — Other Ambulatory Visit: Payer: Medicaid Other

## 2017-08-23 DIAGNOSIS — Z79891 Long term (current) use of opiate analgesic: Secondary | ICD-10-CM

## 2017-08-28 ENCOUNTER — Other Ambulatory Visit: Payer: Self-pay | Admitting: Family Medicine

## 2017-08-28 DIAGNOSIS — G894 Chronic pain syndrome: Secondary | ICD-10-CM

## 2017-08-28 LAB — PAIN MGMT, PROFILE 8 W/CONF, U
6 Acetylmorphine: NEGATIVE ng/mL (ref <10)
ALCOHOL METABOLITES: POSITIVE ng/mL — AB (ref <500)
Amphetamines: NEGATIVE ng/mL (ref <500)
BENZODIAZEPINES: NEGATIVE ng/mL (ref <100)
Buprenorphine, Urine: NEGATIVE ng/mL (ref <5)
COCAINE METABOLITE: NEGATIVE ng/mL (ref <150)
Creatinine: 105.4 mg/dL
ETHYL GLUCURONIDE (ETG): 21751 ng/mL — AB (ref <500)
ETHYL SULFATE (ETS): 4036 ng/mL — AB (ref <100)
MDMA: NEGATIVE ng/mL (ref <500)
Marijuana Metabolite: 54 ng/mL — ABNORMAL HIGH (ref <5)
Marijuana Metabolite: POSITIVE ng/mL — AB (ref <20)
OPIATES: NEGATIVE ng/mL (ref <100)
OXIDANT: NEGATIVE ug/mL (ref <200)
Oxycodone: NEGATIVE ng/mL (ref <100)
pH: 5.27 (ref 4.5–9.0)

## 2017-08-29 ENCOUNTER — Encounter: Payer: Self-pay | Admitting: Family Medicine

## 2017-08-29 NOTE — Progress Notes (Signed)
James Hamilton, a 31 year old male with a history of sickle cell anemia, HbSS and chronic prescription opiate use. Prescription drug profile reviewed, no opiate metabolites present. Patient has been counseled on this matter in the past and was getting weekly pill counts and drug screens. Problem improved for several months, however; most recent drug profile negative for opiate metabolites. Will no longer prescribe opiate medications. I will continue to manage sickle cell anemia, but not chronic pain. Sent a referral to pain management.   [name]  MSN, FNP-C Patient [facility] [address] [place], [place] [address] [phone]

## 2017-09-02 ENCOUNTER — Other Ambulatory Visit: Payer: Self-pay | Admitting: Family Medicine

## 2017-09-02 ENCOUNTER — Telehealth: Payer: Self-pay

## 2017-09-02 DIAGNOSIS — G894 Chronic pain syndrome: Secondary | ICD-10-CM

## 2017-09-02 DIAGNOSIS — D571 Sickle-cell disease without crisis: Secondary | ICD-10-CM

## 2017-09-02 MED ORDER — FENTANYL 12 MCG/HR TD PT72: 12.5000 ug | MEDICATED_PATCH | TRANSDERMAL | 0 refills | Status: AC

## 2017-09-02 NOTE — Progress Notes (Unsigned)
James Hamilton, a 31 year old male with a history of sickle cell anemia, HbSS and chronic prescription opiate use. A prescription drug profile was reviewed. Patient does not have opiate metabolites in urine. Urine positive for marijuana and alcohol metabolites. Informed [name] that I would send a referral to pain management. Will continue to manage patient for sickle cell anemia.   Reviewed [place] Substance Reporting system prior to prescribing opiate medications. No inconsistencies noted.   [name]  MSN, FNP-C Patient [facility] [address] [place], [place] [address] [phone]

## 2017-09-11 ENCOUNTER — Telehealth: Payer: Self-pay

## 2017-09-11 NOTE — Telephone Encounter (Signed)
See medication refill request  

## 2017-09-13 ENCOUNTER — Other Ambulatory Visit: Payer: Self-pay | Admitting: Internal Medicine

## 2017-09-13 DIAGNOSIS — Z79891 Long term (current) use of opiate analgesic: Secondary | ICD-10-CM

## 2017-09-13 DIAGNOSIS — D571 Sickle-cell disease without crisis: Secondary | ICD-10-CM

## 2017-09-13 MED ORDER — OXYCODONE-ACETAMINOPHEN 10-325 MG PO TABS: 1.0000 | ORAL_TABLET | Freq: Four times a day (QID) | ORAL | 0 refills | Status: AC | PRN

## 2017-11-18 ENCOUNTER — Telehealth: Payer: Self-pay

## 2017-11-18 NOTE — Telephone Encounter (Signed)
Faxed this morning, he is no longer under [facility] care. Thanks!

## 2017-12-02 ENCOUNTER — Other Ambulatory Visit: Payer: Self-pay

## 2017-12-02 DIAGNOSIS — D571 Sickle-cell disease without crisis: Secondary | ICD-10-CM

## 2017-12-02 MED ORDER — L-GLUTAMINE ORAL POWDER: 10.0000 g | PACK | Freq: Two times a day (BID) | ORAL | 5 refills | Status: AC

## 2017-12-02 NOTE — Telephone Encounter (Signed)
Refilled patients endari to [place] bioscripts. Spoke with patient he is only going to [facility] for Pain Management and is still coming to [place] for primary care. Thanks!

## 2019-03-10 NOTE — Telephone Encounter (Signed)
Message sent to provider 

## 2019-03-10 NOTE — Telephone Encounter (Signed)
Message send to provider

## 2019-03-11 NOTE — Telephone Encounter (Signed)
Message sent to provider 

## 2019-03-12 NOTE — Telephone Encounter (Signed)
Message sent to provider 

## 2019-03-13 NOTE — Telephone Encounter (Signed)
Message sent to provider 

## 2021-08-31 ENCOUNTER — Ambulatory Visit: Payer: Medicaid Other | Admitting: Family Medicine

## 2024-02-24 ENCOUNTER — Emergency Department (HOSPITAL_BASED_OUTPATIENT_CLINIC_OR_DEPARTMENT_OTHER)

## 2024-02-24 ENCOUNTER — Encounter (HOSPITAL_BASED_OUTPATIENT_CLINIC_OR_DEPARTMENT_OTHER): Payer: Self-pay

## 2024-02-24 ENCOUNTER — Emergency Department (HOSPITAL_BASED_OUTPATIENT_CLINIC_OR_DEPARTMENT_OTHER)
Admission: EM | Admit: 2024-02-24 | Discharge: 2024-02-25 | Disposition: A | Discharge status: Home / Self Care | Attending: Emergency Medicine | Admitting: Emergency Medicine

## 2024-02-24 ENCOUNTER — Other Ambulatory Visit: Payer: Self-pay

## 2024-02-24 DIAGNOSIS — R0789 Other chest pain: Secondary | ICD-10-CM | POA: Diagnosis present

## 2024-02-24 DIAGNOSIS — J189 Pneumonia, unspecified organism: Secondary | ICD-10-CM | POA: Insufficient documentation

## 2024-02-24 DIAGNOSIS — F17210 Nicotine dependence, cigarettes, uncomplicated: Secondary | ICD-10-CM | POA: Insufficient documentation

## 2024-02-24 HISTORY — DX: Unspecified osteoarthritis, unspecified site: M19.90

## 2024-02-24 LAB — CBC WITH DIFFERENTIAL/PLATELET
Abs Immature Granulocytes: 0.05 10*3/uL (ref 0.00–0.07)
Basophils Absolute: 0 10*3/uL (ref 0.0–0.1)
Basophils Relative: 0 %
Eosinophils Absolute: 0.1 10*3/uL (ref 0.0–0.5)
Eosinophils Relative: 1 %
HCT: 39.1 % (ref 39.0–52.0)
Hemoglobin: 13.3 g/dL (ref 13.0–17.0)
Immature Granulocytes: 1 %
Lymphocytes Relative: 15 %
Lymphs Abs: 1.6 10*3/uL (ref 0.7–4.0)
MCH: 24.3 pg — ABNORMAL LOW (ref 26.0–34.0)
MCHC: 34 g/dL (ref 30.0–36.0)
MCV: 71.4 fL — ABNORMAL LOW (ref 80.0–100.0)
Monocytes Absolute: 0.8 10*3/uL (ref 0.1–1.0)
Monocytes Relative: 7 %
Neutro Abs: 8 10*3/uL — ABNORMAL HIGH (ref 1.7–7.7)
Neutrophils Relative %: 76 %
Platelets: 310 10*3/uL (ref 150–400)
RBC: 5.48 MIL/uL (ref 4.22–5.81)
RDW: 15.4 % (ref 11.5–15.5)
WBC: 10.5 10*3/uL (ref 4.0–10.5)
nRBC: 0 % (ref 0.0–0.2)

## 2024-02-24 LAB — COMPREHENSIVE METABOLIC PANEL WITH GFR
ALT: 19 U/L (ref 0–44)
AST: 19 U/L (ref 15–41)
Albumin: 4.3 g/dL (ref 3.5–5.0)
Alkaline Phosphatase: 70 U/L (ref 38–126)
Anion gap: 10 (ref 5–15)
BUN: 8 mg/dL (ref 6–20)
CO2: 25 mmol/L (ref 22–32)
Calcium: 9.4 mg/dL (ref 8.9–10.3)
Chloride: 99 mmol/L (ref 98–111)
Creatinine, Ser: 1.11 mg/dL (ref 0.61–1.24)
GFR, Estimated: >60 mL/min (ref >60)
Glucose, Bld: 90 mg/dL (ref 70–99)
Potassium: 4 mmol/L (ref 3.5–5.1)
Sodium: 134 mmol/L — ABNORMAL LOW (ref 135–145)
Total Bilirubin: 0.7 mg/dL (ref 0.0–1.2)
Total Protein: 8.2 g/dL — ABNORMAL HIGH (ref 6.5–8.1)

## 2024-02-24 LAB — TROPONIN I (HIGH SENSITIVITY): Troponin I (High Sensitivity): <2 ng/L (ref <18)

## 2024-02-24 LAB — RETICULOCYTES
Immature Retic Fract: 26.5 % — ABNORMAL HIGH (ref 2.3–15.9)
RBC.: 5.47 MIL/uL (ref 4.22–5.81)
Retic Count, Absolute: 71.7 10*3/uL (ref 19.0–186.0)
Retic Ct Pct: 1.3 % (ref 0.4–3.1)

## 2024-02-24 MED ORDER — KETOROLAC TROMETHAMINE 15 MG/ML IJ SOLN
15.0000 mg | Freq: Once | INTRAMUSCULAR | Status: AC
  Administered 2024-02-24: 15 mg via INTRAVENOUS
  Filled 2024-02-24: qty 1

## 2024-02-24 MED ORDER — HYDROMORPHONE HCL 1 MG/ML IJ SOLN
1.0000 mg | Freq: Once | INTRAMUSCULAR | Status: AC
  Administered 2024-02-24: 1 mg via INTRAVENOUS
  Filled 2024-02-24: qty 1

## 2024-02-24 MED ORDER — IOHEXOL 350 MG/ML SOLN
75.0000 mL | Freq: Once | INTRAVENOUS | Status: AC | PRN
  Administered 2024-02-24: 75 mL via INTRAVENOUS

## 2024-02-24 MED ORDER — SODIUM CHLORIDE 0.9 % IV BOLUS
1000.0000 mL | Freq: Once | INTRAVENOUS | Status: AC
  Administered 2024-02-24: 1000 mL via INTRAVENOUS

## 2024-02-24 NOTE — ED Provider Notes (Signed)
 [facility] Emergency Department Provider Note MRN:  [id number]  Arrival date & time: 02/25/24     Chief Complaint   Chest Pain   History of Present Illness   James Hamilton is a 38 y.o. year-old male with a history of sickle cell anemia presenting to the ED with chief complaint of chest pain.  Pleuritic chest pain, left anterior lower chest all day today, worse with every breath, pain is moderate to severe.  Feels like a sickle cell crisis in his chest, has never had that before.  Denies fever or cough.  No leg pain or swelling.  Review of Systems  A thorough review of systems was obtained and all systems are negative except as noted in the HPI and PMH.   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 S/P repair   Sickle cell anemia (HCC)    Sickle cell disease (HCC)     Past Surgical History:  Procedure Laterality Date   COMPRESSION HIP SCREW Right 12/16/2013   Procedure: COMPRESSION HIP;  Surgeon: Sheral Apley, MD;  Location: [facility] OR;  Service: Orthopedics;  Laterality: Right;   FEMUR IM NAIL Right 12/16/2013   Procedure: INTRAMEDULLARY (IM) RETROGRADE FEMORAL NAILING;  Surgeon: Sheral Apley, MD;  Location: [facility] OR;  Service: Orthopedics;  Laterality: Right;   FRACTURE SURGERY Right 12/16/2013   Femoral neck and femoral shaft fractures with IM nailing of femur and compression screw to right hip    Family History  Problem Relation Age of Onset   Thalassemia Mother    Sickle cell trait Father    Cerebral aneurysm Paternal Grandmother    Cerebral aneurysm Paternal Uncle     Social History   Socioeconomic History   Marital status: Single    Spouse name: Not on file   Number of children: Not on file   Years of education: Not on file   Highest education level: Not on file  Occupational  History   Not on file  Tobacco Use   Smoking status: Light Smoker    Types: Cigarettes   Smokeless tobacco: Never   Tobacco comments:    Pt states he smokes loose cigarettes (2) at the most  Vaping Use   Vaping status: Never Used  Substance and Sexual Activity   Alcohol use: No   Drug use: Not Currently    Types: Marijuana   Sexual activity: Yes    Birth control/protection: None  Other Topics Concern   Not on file  Social History Narrative   ** Merged History Encounter **       Social Drivers of Health   Financial Resource Strain: Low Risk  (11/25/2023)   Received from [facility]   Overall Financial Resource Strain (CARDIA)    Difficulty of Paying Living Expenses: Not very hard  Food Insecurity: No Food Insecurity (11/25/2023)   Received from [facility]   Hunger Vital Sign    Worried About Running Out of Food in the Last Year: Never true    Ran Out of Food in the Last Year: Never true  Transportation Needs: No Transportation Needs (11/25/2023)   Received from [facility] - Transportation    Lack of Transportation (Medical): No    Lack of Transportation (Non-Medical): No  Physical Activity: Unknown (  11/25/2023)   Received from [facility]   Exercise Vital Sign    Days of Exercise per Week: 1 day    Minutes of Exercise per Session: Not on file  Stress: No Stress Concern Present (11/25/2023)   Received from [facility] - Occupational Stress Questionnaire    Feeling of Stress : Only a little  Social Connections: Somewhat Isolated (11/25/2023)   Received from [facility]   Social Network    How would you rate your social network (family, work, friends)?: Restricted participation with some degree of social isolation  Intimate Partner Violence: Not At Risk (11/25/2023)   Received from [facility]   HITS    Over the last 12 months how often did your partner physically hurt you?: Never    Over the last 12  months how often did your partner insult you or talk down to you?: Never    Over the last 12 months how often did your partner threaten you with physical harm?: Never    Over the last 12 months how often did your partner scream or curse at you?: Never     Physical Exam   Vitals:   02/24/24 1955 02/25/24 0027  BP: 123/80 119/74  Pulse: (!) 101 88  Resp: 17 15  Temp: 99 F (37.2 C) 98.9 F (37.2 C)  SpO2: 100% 96%    CONSTITUTIONAL: Well-appearing, NAD NEURO/PSYCH:  Alert and oriented x 3, no focal deficits EYES:  eyes equal and reactive ENT/NECK:  no LAD, no JVD CARDIO: Regular rate, well-perfused, normal S1 and S2 PULM:  CTAB no wheezing or rhonchi GI/GU:  non-distended, non-tender MSK/SPINE:  No gross deformities, no edema SKIN:  no rash, atraumatic   *Additional and/or pertinent findings included in MDM below  Diagnostic and Interventional Summary    EKG Interpretation Date/Time:  Monday February 24 2024 19:57:07 EDT Ventricular Rate:  98 PR Interval:  140 QRS Duration:  76 QT Interval:  312 QTC Calculation: 398 R Axis:   50  Text Interpretation: Normal sinus rhythm Nonspecific T wave abnormality Abnormal ECG When compared with ECG of 20-Apr-2012 13:07, PREVIOUS ECG IS PRESENT Confirmed by Kennis Carina [phone]) on 02/24/2024 11:50:07 PM       Labs Reviewed  CBC WITH DIFFERENTIAL/PLATELET - Abnormal; Notable for the following components:      Result Value   MCV 71.4 (*)    MCH 24.3 (*)    Neutro Abs 8.0 (*)    All other components within normal limits  COMPREHENSIVE METABOLIC PANEL WITH GFR - Abnormal; Notable for the following components:   Sodium 134 (*)    Total Protein 8.2 (*)    All other components within normal limits  RETICULOCYTES - Abnormal; Notable for the following components:   Immature Retic Fract 26.5 (*)    All other components within normal limits  BRAIN NATRIURETIC PEPTIDE  TROPONIN I (HIGH SENSITIVITY)    CT Angio Chest Pulmonary Embolism  (PE) W or WO Contrast  Final Result    DG Chest 2 View  Final Result      Medications  cephALEXin (KEFLEX) capsule 500 mg (has no administration in time range)  azithromycin (ZITHROMAX) tablet 500 mg (has no administration in time range)  HYDROmorphone (DILAUDID) injection 1 mg (1 mg Intravenous Given 02/24/24 2337)  ketorolac (TORADOL) 15 MG/ML injection 15 mg (15 mg Intravenous Given 02/24/24 2336)  sodium chloride 0.9 % bolus 1,000 mL (0 mLs Intravenous Stopped 02/25/24  0205)  iohexol (OMNIPAQUE) 350 MG/ML injection 75 mL (75 mLs Intravenous Contrast Given 02/24/24 2347)     Procedures  /  Critical Care Procedures  ED Course and Medical Decision Making  Initial Impression and Ddx Concern for acute chest versus pulmonary embolism, 38 year old male history of sickle cell anemia.  Pain control, awaiting CTA.  Past medical/surgical history that increases complexity of ED encounter: Sickle cell anemia  Interpretation of Diagnostics I personally reviewed the EKG and my interpretation is as follows: Sinus rhythm, nonspecific findings  No significant blood count or electrolyte disturbance.  Troponin negative  Patient Reassessment and Ultimate Disposition/Management     CTA revealing increased confluence on the left side corresponding with patient's location of pain.  Patient doing a lot better on reassessment after pain control, vitals normal, no hypoxia, no tachypnea.  Still early acute chest is a consideration.  I discussed case with hospitalist, given normal vitals and improving clinical status patient seems appropriate for discharge with antibiotics, strict return precautions.  Patient management required discussion with the following services or consulting groups:  Hospitalist Service  Complexity of Problems Addressed Acute illness or injury that poses threat of life of bodily function  Additional Data Reviewed and Analyzed Further history obtained from: Further history from  spouse/family member  Additional Factors Impacting ED Encounter Risk Consideration of hospitalization  Merrick Abe. Harless Lien, MD [facility] Emergency Medicine [facility] [email or web address]  Final Clinical Impressions(s) / ED Diagnoses     ICD-10-CM   1. Community acquired pneumonia of left lung, unspecified part of lung  J18.9       ED Discharge Orders          Ordered    cefdinir (OMNICEF) 300 MG capsule  2 times daily        02/25/24 0252    azithromycin (ZITHROMAX) 250 MG tablet  Daily        02/25/24 0252             Discharge Instructions Discussed with and Provided to Patient:     Discharge Instructions      You were evaluated in the Emergency Department and after careful evaluation, we did not find any emergent condition requiring admission or further testing in the hospital.  Your exam/testing today is overall reassuring.  Symptoms may be due to pneumonia.  We also discussed the possibility of early acute chest syndrome.  Very important you monitor your symptoms at home, return for any worsening shortness of breath.  Take the Omnicef and azithromycin antibiotics as directed.  Continue your home pain control medications.  Please return to the Emergency Department if you experience any worsening of your condition.   Thank you for allowing us  to be a part of your care.       Edson Graces, MD 02/25/24 [phone]

## 2024-02-24 NOTE — ED Triage Notes (Addendum)
 Left sided chest pain began this am and has progressively become worse.  Reports hurts to take a deep breath. States he has RA and has taken pain meds that have been ineffective. Pt does have sickle cell but states this is not a crisis type pain

## 2024-02-25 ENCOUNTER — Telehealth (HOSPITAL_BASED_OUTPATIENT_CLINIC_OR_DEPARTMENT_OTHER): Payer: Self-pay | Admitting: Emergency Medicine

## 2024-02-25 LAB — BRAIN NATRIURETIC PEPTIDE: B Natriuretic Peptide: 9.4 pg/mL (ref 0.0–100.0)

## 2024-02-25 MED ORDER — CEPHALEXIN 250 MG PO CAPS
500.0000 mg | ORAL_CAPSULE | Freq: Once | ORAL | Status: AC
Start: 2024-02-25 — End: 2024-02-25
  Administered 2024-02-25: 500 mg via ORAL
  Filled 2024-02-25: qty 2

## 2024-02-25 MED ORDER — CEFDINIR 300 MG PO CAPS: 300.0000 mg | ORAL_CAPSULE | Freq: Two times a day (BID) | ORAL | 0 refills | Status: DC

## 2024-02-25 MED ORDER — AZITHROMYCIN 250 MG PO TABS
250.0000 mg | ORAL_TABLET | Freq: Every day | ORAL | 0 refills | Status: DC
Start: 2024-02-25 — End: 2024-02-25

## 2024-02-25 MED ORDER — AZITHROMYCIN 250 MG PO TABS
500.0000 mg | ORAL_TABLET | Freq: Once | ORAL | Status: AC
Start: 2024-02-25 — End: 2024-02-25
  Administered 2024-02-25: 500 mg via ORAL
  Filled 2024-02-25: qty 2

## 2024-02-25 MED ORDER — CEFDINIR 300 MG PO CAPS: 300.0000 mg | ORAL_CAPSULE | Freq: Two times a day (BID) | ORAL | 0 refills | Status: AC

## 2024-02-25 MED ORDER — AZITHROMYCIN 250 MG PO TABS: 250.0000 mg | ORAL_TABLET | Freq: Every day | ORAL | 0 refills | Status: AC

## 2024-02-25 NOTE — Discharge Instructions (Signed)
 You were evaluated in the Emergency Department and after careful evaluation, we did not find any emergent condition requiring admission or further testing in the hospital.  Your exam/testing today is overall reassuring.  Symptoms may be due to pneumonia.  We also discussed the possibility of early acute chest syndrome.  Very important you monitor your symptoms at home, return for any worsening shortness of breath.  Take the Omnicef and azithromycin antibiotics as directed.  Continue your home pain control medications.  Please return to the Emergency Department if you experience any worsening of your condition.   Thank you for allowing us  to be a part of your care.

## 2024-02-25 NOTE — Telephone Encounter (Cosign Needed)
 Call to ask rx sent to different pharmacy, rx sent pre request
